# Patient Record
Sex: Male | Born: 1945 | Race: White | Hispanic: No | Marital: Married | Smoking: Never smoker
Health system: Southern US, Community
[De-identification: ages and names within clinical notes are randomized; demographics above are authoritative.]

## PROBLEM LIST (undated history)

## (undated) DIAGNOSIS — J189 Pneumonia, unspecified organism: Secondary | ICD-10-CM

## (undated) DIAGNOSIS — C187 Malignant neoplasm of sigmoid colon: Secondary | ICD-10-CM

## (undated) DIAGNOSIS — Z87442 Personal history of urinary calculi: Secondary | ICD-10-CM

## (undated) DIAGNOSIS — D649 Anemia, unspecified: Secondary | ICD-10-CM

## (undated) DIAGNOSIS — Z7189 Other specified counseling: Secondary | ICD-10-CM

## (undated) HISTORY — PX: SHOULDER SURGERY: SHX246

## (undated) HISTORY — DX: Other specified counseling: Z71.89

## (undated) HISTORY — PX: CARDIAC CATHETERIZATION: SHX172

## (undated) HISTORY — PX: OTHER SURGICAL HISTORY: SHX169

## (undated) HISTORY — PX: HERNIA REPAIR: SHX51

---

## 1999-09-22 ENCOUNTER — Ambulatory Visit (HOSPITAL_COMMUNITY): Admission: RE | Admit: 1999-09-22 | Discharge: 1999-09-22 | Payer: Self-pay | Admitting: Gastroenterology

## 1999-09-22 ENCOUNTER — Encounter (INDEPENDENT_AMBULATORY_CARE_PROVIDER_SITE_OTHER): Payer: Self-pay | Admitting: Specialist

## 2000-10-31 ENCOUNTER — Ambulatory Visit (HOSPITAL_COMMUNITY): Admission: RE | Admit: 2000-10-31 | Discharge: 2000-10-31 | Payer: Self-pay | Admitting: Gastroenterology

## 2003-05-27 ENCOUNTER — Ambulatory Visit (HOSPITAL_COMMUNITY): Admission: RE | Admit: 2003-05-27 | Discharge: 2003-05-27 | Payer: Self-pay | Admitting: Orthopedic Surgery

## 2003-05-27 ENCOUNTER — Ambulatory Visit (HOSPITAL_BASED_OUTPATIENT_CLINIC_OR_DEPARTMENT_OTHER): Admission: RE | Admit: 2003-05-27 | Discharge: 2003-05-27 | Payer: Self-pay | Admitting: Orthopedic Surgery

## 2003-12-29 ENCOUNTER — Ambulatory Visit (HOSPITAL_COMMUNITY): Admission: RE | Admit: 2003-12-29 | Discharge: 2003-12-29 | Payer: Self-pay | Admitting: Gastroenterology

## 2004-05-27 ENCOUNTER — Ambulatory Visit (HOSPITAL_COMMUNITY): Admission: RE | Admit: 2004-05-27 | Discharge: 2004-05-27 | Payer: Self-pay | Admitting: Chiropractic Medicine

## 2005-10-07 ENCOUNTER — Encounter: Admission: RE | Admit: 2005-10-07 | Discharge: 2005-10-07 | Payer: Self-pay | Admitting: Orthopedic Surgery

## 2005-10-16 ENCOUNTER — Ambulatory Visit (HOSPITAL_BASED_OUTPATIENT_CLINIC_OR_DEPARTMENT_OTHER): Admission: RE | Admit: 2005-10-16 | Discharge: 2005-10-16 | Payer: Self-pay | Admitting: Orthopedic Surgery

## 2007-05-31 ENCOUNTER — Emergency Department (HOSPITAL_COMMUNITY): Admission: EM | Admit: 2007-05-31 | Discharge: 2007-05-31 | Payer: Self-pay | Admitting: *Deleted

## 2009-10-31 ENCOUNTER — Emergency Department (HOSPITAL_COMMUNITY): Admission: EM | Admit: 2009-10-31 | Discharge: 2009-10-31 | Payer: Self-pay | Admitting: Emergency Medicine

## 2009-11-08 ENCOUNTER — Ambulatory Visit (HOSPITAL_COMMUNITY): Admission: RE | Admit: 2009-11-08 | Discharge: 2009-11-08 | Payer: Self-pay | Admitting: Urology

## 2010-03-15 ENCOUNTER — Ambulatory Visit (HOSPITAL_BASED_OUTPATIENT_CLINIC_OR_DEPARTMENT_OTHER): Admission: RE | Admit: 2010-03-15 | Discharge: 2010-03-15 | Payer: Self-pay | Admitting: Orthopedic Surgery

## 2010-10-31 LAB — URINE CULTURE
Colony Count: NO GROWTH
Culture: NO GROWTH

## 2010-10-31 LAB — DIFFERENTIAL
Basophils Absolute: 0 10*3/uL (ref 0.0–0.1)
Basophils Relative: 1 % (ref 0–1)
Eosinophils Absolute: 0.2 10*3/uL (ref 0.0–0.7)
Eosinophils Relative: 3 % (ref 0–5)
Lymphocytes Relative: 37 % (ref 12–46)
Lymphs Abs: 3.2 10*3/uL (ref 0.7–4.0)
Monocytes Absolute: 0.8 10*3/uL (ref 0.1–1.0)
Monocytes Relative: 9 % (ref 3–12)
Neutro Abs: 4.4 10*3/uL (ref 1.7–7.7)
Neutrophils Relative %: 51 % (ref 43–77)

## 2010-10-31 LAB — URINALYSIS, ROUTINE W REFLEX MICROSCOPIC
Bilirubin Urine: NEGATIVE
Glucose, UA: NEGATIVE mg/dL
Ketones, ur: NEGATIVE mg/dL
Leukocytes, UA: NEGATIVE
Nitrite: NEGATIVE
Protein, ur: NEGATIVE mg/dL
Specific Gravity, Urine: 1.016 (ref 1.005–1.030)
Urobilinogen, UA: 0.2 mg/dL (ref 0.0–1.0)
pH: 5.5 (ref 5.0–8.0)

## 2010-10-31 LAB — URINE MICROSCOPIC-ADD ON

## 2010-10-31 LAB — POCT I-STAT, CHEM 8
BUN: 13 mg/dL (ref 6–23)
Calcium, Ion: 1.21 mmol/L (ref 1.12–1.32)
Chloride: 106 mEq/L (ref 96–112)
Creatinine, Ser: 1.2 mg/dL (ref 0.4–1.5)
Glucose, Bld: 114 mg/dL — ABNORMAL HIGH (ref 70–99)
HCT: 42 % (ref 39.0–52.0)
Hemoglobin: 14.3 g/dL (ref 13.0–17.0)
Potassium: 3.6 mEq/L (ref 3.5–5.1)
Sodium: 142 mEq/L (ref 135–145)
TCO2: 28 mmol/L (ref 0–100)

## 2010-10-31 LAB — CBC
HCT: 41.2 % (ref 39.0–52.0)
Hemoglobin: 14.4 g/dL (ref 13.0–17.0)
MCHC: 34.9 g/dL (ref 30.0–36.0)
MCV: 91 fL (ref 78.0–100.0)
Platelets: 182 10*3/uL (ref 150–400)
RBC: 4.52 MIL/uL (ref 4.22–5.81)
RDW: 12.1 % (ref 11.5–15.5)
WBC: 8.6 10*3/uL (ref 4.0–10.5)

## 2010-12-23 NOTE — Op Note (Signed)
NAME:  Jack Gray, Jack Gray                            ACCOUNT NO.:  [id number]   MEDICAL RECORD NO.:  [id number]                   PATIENT TYPE:  AMB   LOCATION:  DSC                                  FACILITY:  [facility]   PHYSICIAN:  Feliberto Gottron. Turner Daniels, M.D.                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   PROCEDURE:  Right shoulder arthroscopic anterior inferior acromioplasty,  distal clavicular co-planing, followed by mini-open rotator cuff repair  using two of the 4 mm Revo screws.   SURGEON:  Feliberto Gottron. Turner Daniels, M.D.   FIRST ASSISTANT:  Erskine Squibb B. Jannet Mantis.   ANESTHESIA:  Interscalene block with general endotracheal.   ESTIMATED BLOOD LOSS:  Minimal.   FLUIDS REPLACED:  1 L crystalloid.   DRAINS PLACED:  None.   TOURNIQUET TIME:  None.   INDICATION FOR PROCEDURE:  A 65 year old man with a one-year history of  right shoulder pain and impingement syndrome, a 1 cm type 2-3 subacromial  spur.  MRI scan showed a 5-8 mm full-thickness supraspinatus rotator cuff  tear.  Because of failure to get better with conservative treatment,  temporary response to cortisone injection, he was taken for arthroscopic  decompression of the right shoulder and evaluation and treatment of the  rotator cuff as dictated by the arthroscopy.   DESCRIPTION OF PROCEDURE:  The patient identified by arm band and taken to  the operating room at [facility] day surgery center, appropriate anesthetic  monitors were attached.  Interscalene block anesthesia induced into the  right upper extremity, followed by general endotracheal anesthesia.  He was  placed in a beach chair position an the right upper extremity prepped and  draped in the usual sterile fashion from the wrist to the hemithorax.  Using  a #11 blade, standard portals were made 1.5 cm anterior to the [facility] joint,  lateral to the junction of the middle and posterior thirds of the acromion,  and posterior to the posterolateral corner of the acromion process.  The  arthroscope was introduced laterally, the inflow anteriorly, and a 4.2 Great  White sucker shaver posteriorly, allowing subacromial bursectomy and partial  debridement of a supraspinatus rotator cuff tear at the insertion.  We also  outlined the subacromial and subclavicular spurs, which were then removed  with a 4.5 hooded Vortex bur.  An underwater electrocautery device was used  to control bleeding.  The rest of the rotator cuff was intact externally.  The arthroscope was then repositioned into the glenohumeral joint using the  posterior portal, where we documented some minor fraying of the labrum.  The  articular cartilage looked  to be in good shape.  It was a full-thickness  tear as documented by an internal evaluation of the rotator cuff as well.  The biceps anchor was intact, and the rest of the rotator cuff was intact.  At this point the arthroscopic instruments were removed and a standard  anterolateral deltoid-splitting approach was made to the shoulder, starting  onto the tip of the acromion and going distally for 3-4 cm through the skin  and subcutaneous tissue.  We then split the fibers of the deltoid muscle and  exposed the rotator cuff tear, which was minimally retracted only about 3-4  mm.  Two of the standard 4 mm Revo screw anchors were then placed into the  bone after tapping first, and then the supraspinatus was repaired back to  its original insertion using the 2-0 Ethibond suture simple suture.  Satisfied with the repair, the wound was washed out with normal saline  solution, the subcutaneous tissue closed with 3-0 Vicryl suture, and the  skin with running interlocking 4-0  nylon suture.  A dressing of Xeroform, 4  x 4 dressing sponges, paper tape, and a shoulder immobilizer applied.  The  patient was then awakened and taken to the recovery room without difficulty.                                                Feliberto Gottron. Turner Daniels, M.D.    Ovid Curd  D:  05/27/2003  T:  05/27/2003  Job:  [id number]

## 2010-12-23 NOTE — Op Note (Signed)
NAME:  Jack Gray, Jack Gray                  ACCOUNT NO.:  [id number]   MEDICAL RECORD NO.:  [id number]          PATIENT TYPE:  AMB   LOCATION:  DSC                          FACILITY:  [facility]   PHYSICIAN:  Feliberto Gottron. Turner Daniels, M.D.   DATE OF BIRTH:  03/23/1946   DATE OF PROCEDURE:  10/16/2005  DATE OF DISCHARGE:                                 OPERATIVE REPORT   PREOPERATIVE DIAGNOSIS:  Left shoulder impingement syndrome, partial rotator  cuff tear, possible labral tear.   POSTOPERATIVE DIAGNOSIS:  Left shoulder impingement syndrome with the  anterior inferior subacromial spur, full-thickness rotator cuff tear and  superior labral tear, degenerative in nature.   PROCEDURE:  Left shoulder arthroscopic anterior inferior acromioplasty,  rotator cuff repair using a single parachute anchor from [employer] and  debridement of labral tear.   SURGEON:  Feliberto Gottron. Turner Daniels, MD   ASSISTANT:  Skip Mayer, PA-C.   ANESTHETIC:  Interscalene block plus general endotracheal.   ESTIMATED BLOOD LOSS:  Minimal.   FLUID REPLACEMENT:  600 mL crystalloid.   DRAINS PLACED:  None.   TOURNIQUET TIME:  None.   INDICATIONS FOR PROCEDURE:  65 year old man has previously had a rotator  cuff repair using a mini open technique on the contralateral right side some  years ago.  Had similar symptoms on the left.  MRI scan shows a partial  rotator cuff tear but not full-thickness. Increasing pain, catching and  popping, desires elective arthroscopic evaluation and treatment of the  shoulder up to and including a rotator cuff repair if needed.  Not too much  in way of [facility] joint pain and he has failed conservative treatment. Risks,  benefits of surgery are well on the patient. Questions answered. He is  prepared for surgical intervention.   DESCRIPTION OF PROCEDURE:  The patient identified by armband and taken to  the operating room at [facility] day surgery center after the successful induction  of left shoulder interscalene block.  General endotracheal anesthesia was  administered with the patient in supine position after the appropriate  anesthetic monitors were attached and he was then placed in the beach chair  position, left upper extremity prepped and draped in usual sterile fashion  from the wrist to the hemithorax. We began the procedure by making standard  portals 1.5 cm anterior to the [facility] joint lateral to the junction of middle  and posterior thirds acromion, posterior the posterolateral corner acromion  process. The inflow was placed anteriorly. The arthroscope laterally and a  4.2 great white sucker shaver posteriorly allowing subacromial bursectomy.  We outlined the subacromial spur which was about a centimeter in size and  then removed it with a 4.5 hooded vortex bur. We then carefully evaluated  the external surface the rotator cuff and did find a small tear of the  rotator cuff insertion supraspinatus that was not retracted and was quite  amendable to repair with a single parachute anchor.  Using a supplemental  anterolateral portal, traction was applied to the cuff to make sure it was  fully reduced and then a second anterolateral portal  was placed about a  centimeter in length, allowing introduction of a large 8-mm cannula.  Through this cannula a parachute anchor was then hammered into the rotator  cuff and into the greater tuberosity and screwed down obtaining good firm  fixation and photographic documentation was made of the anchor in place as  well as the reduced rotator cuff.  The arthroscope was then reinserted into  the glenohumeral joint using the posterior portal where we documented good  fixation of reduction of the rotator cuff and also documented a tear of the  superior labrum degenerative in nature which was then debrided with a 4.2  great white sucker shaver. The glenohumeral joint itself was in relatively  good condition as was the biceps anchor.  The shoulder was then irrigated  out  normal saline solution.  Arthroscopic instruments removed and a dressing  of Xeroform, 4x4 dressing sponges, paper tape and a sling immobilizer  applied. The patient was laid supine, awakened and taken to the recovery  room without difficulty.      Feliberto Gottron. Turner Daniels, M.D.  Electronically Signed     FJR/MEDQ  D:  10/16/2005  T:  10/17/2005  Job:  [id number]

## 2010-12-23 NOTE — Op Note (Signed)
NAME:  Jack Gray, Jack Gray                              ACCOUNT NO.:  [id number]   MEDICAL RECORD NO.:  [id number]                   PATIENT TYPE:  AMB   LOCATION:  ENDO                                 FACILITY:  [facility]   PHYSICIAN:  James L. Malon Kindle., M.D.          DATE OF BIRTH:  [birth year]   DATE OF PROCEDURE:  12/29/2003  DATE OF DISCHARGE:                                 OPERATIVE REPORT   PROCEDURE PERFORMED:  Colonoscopy.   ENDOSCOPIST:  Llana Aliment. Edwards, M.D.   MEDICATIONS:  Fentanyl 75 mcg, Versed 7.5 mg IV.   INSTRUMENT USED:  Pediatric Olympus video colonoscope.   INDICATIONS FOR PROCEDURE:  Previous history of colon polyp.  Done as a  three-year follow-up.   DESCRIPTION OF PROCEDURE:  The procedure had been explained to the patient  and consent obtained.  With the patient in the left lateral decubitus  position, the Olympus colonoscope was inserted and advanced.  The prep was  excellent.  The patient had extensive diverticular disease but once we were  able to pass the sigmoid colon, the prep was quite good and we were able to  advance easily to the cecum.  The ileocecal valve and appendiceal orifice  were seen.  The scope was withdrawn and the colon carefully examined upon  withdrawal.  No polyps or other lesions were seen.  The ascending colon,  transverse colon, descending and sigmoid colon were seen with marked  diverticular disease with multiple small and large-mouthed diverticula in  the sigmoid but no polyps.  The rectum was free of polyps as well.  The  scope was retroflexed.  The scope was withdrawn.  The patient tolerated the  procedure well.   ASSESSMENT:  1. No evidence of further colon polyps, V12.72.  2. Extensive diverticulosis.   PLAN:  Will go ahead and check Hemoccults yearly and recommend repeating  colonoscopy in five years.  Will give a diverticulosis information sheet.                                               James L. Malon Kindle., M.D.    Waldron Session  D:  12/29/2003  T:  12/30/2003  Job:  [id number]   cc:   Caryn Bee L. Little, M.D.  [address]  [place]  [place] [address]  Fax: [phone]

## 2010-12-23 NOTE — Procedures (Signed)
[facility]  Patient:    Jack Gray, Jack Gray                         MRN: [id number] Proc. Date: 10/31/00 Adm. Date:  [id number] Attending:  Orland Mustard CC:         Anna Genre. Little, M.D.   Procedure Report  PROCEDURE PERFORMED:  Colonoscopy.  ENDOSCOPIST:  Llana Aliment. Randa Evens, M.D.  MEDICATIONS USED:  Fentanyl 90 mcg, Versed 9 mg IV.  INSTRUMENT:  Olympus video colonoscope.  INDICATIONS:  The patient had heme positive stool, had multiple polyps removed a year ago.  This procedure was done to evaluate for additional polyps.  DESCRIPTION OF PROCEDURE:  The procedure had been explained to the patient and consent obtained.  With the patient in the left lateral decubitus position, the Olympus adult video colonoscope was inserted and advanced under direct visualization.  The prep was excellent and we were able to advance to the cecum without difficulty.  The ileocecal valve was seen.  The scope was withdrawn.  The cecum, ascending colon, hepatic flexure, transverse colon, splenic flexure, descending and sigmoid colon were seen well.  Moderate diverticulosis in the sigmoid colon, no polyps seen throughout the entire colon.  Rectum free of polyps.  Scope withdrawn and patient tolerated the procedure well.  ASSESSMENT: 1. No evidence of any further polyps. 2. Moderate diverticulosis.  PLAN:  Will recommend repeating in three years. DD:  10/31/00 TD:  10/31/00 Job: [id number]

## 2011-05-17 LAB — CBC
HCT: 39.7
Hemoglobin: 13.9
MCHC: 35
MCV: 88.2
Platelets: 228
RBC: 4.5
RDW: 12.5
WBC: 7.1

## 2011-05-17 LAB — DIFFERENTIAL
Basophils Absolute: 0
Basophils Relative: 0
Eosinophils Absolute: 0.1
Eosinophils Relative: 2
Lymphocytes Relative: 19
Lymphs Abs: 1.3
Monocytes Absolute: 0.6
Monocytes Relative: 9
Neutro Abs: 5
Neutrophils Relative %: 70

## 2011-05-17 LAB — POCT I-STAT CREATININE
Creatinine, Ser: 1.3
Operator id: 288331

## 2011-05-17 LAB — I-STAT 8, (EC8 V) (CONVERTED LAB)
Acid-Base Excess: 1
BUN: 18
Bicarbonate: 26.5 — ABNORMAL HIGH
Chloride: 107
Glucose, Bld: 89
HCT: 41
Hemoglobin: 13.9
Operator id: 288331
Potassium: 4.4
Sodium: 140
TCO2: 28
pCO2, Ven: 44.9 — ABNORMAL LOW
pH, Ven: 7.379 — ABNORMAL HIGH

## 2011-05-17 LAB — POCT CARDIAC MARKERS
CKMB, poc: 1.1
Myoglobin, poc: 118
Operator id: 288331
Troponin i, poc: <0.05

## 2011-09-11 DIAGNOSIS — H6120 Impacted cerumen, unspecified ear: Secondary | ICD-10-CM | POA: Diagnosis not present

## 2011-09-11 DIAGNOSIS — Z23 Encounter for immunization: Secondary | ICD-10-CM | POA: Diagnosis not present

## 2011-09-11 DIAGNOSIS — Z0000 Encounter for general adult medical examination without abnormal findings: Secondary | ICD-10-CM | POA: Diagnosis not present

## 2011-09-11 DIAGNOSIS — R7301 Impaired fasting glucose: Secondary | ICD-10-CM | POA: Diagnosis not present

## 2011-09-11 DIAGNOSIS — Z79899 Other long term (current) drug therapy: Secondary | ICD-10-CM | POA: Diagnosis not present

## 2011-09-11 DIAGNOSIS — E785 Hyperlipidemia, unspecified: Secondary | ICD-10-CM | POA: Diagnosis not present

## 2011-09-11 DIAGNOSIS — Z125 Encounter for screening for malignant neoplasm of prostate: Secondary | ICD-10-CM | POA: Diagnosis not present

## 2011-10-20 DIAGNOSIS — L821 Other seborrheic keratosis: Secondary | ICD-10-CM | POA: Diagnosis not present

## 2011-10-20 DIAGNOSIS — L570 Actinic keratosis: Secondary | ICD-10-CM | POA: Diagnosis not present

## 2012-01-04 DIAGNOSIS — H2510 Age-related nuclear cataract, unspecified eye: Secondary | ICD-10-CM | POA: Diagnosis not present

## 2012-03-21 DIAGNOSIS — R7301 Impaired fasting glucose: Secondary | ICD-10-CM | POA: Diagnosis not present

## 2012-03-21 DIAGNOSIS — H6120 Impacted cerumen, unspecified ear: Secondary | ICD-10-CM | POA: Diagnosis not present

## 2012-03-21 DIAGNOSIS — E785 Hyperlipidemia, unspecified: Secondary | ICD-10-CM | POA: Diagnosis not present

## 2012-03-21 DIAGNOSIS — Z79899 Other long term (current) drug therapy: Secondary | ICD-10-CM | POA: Diagnosis not present

## 2012-03-21 DIAGNOSIS — C4491 Basal cell carcinoma of skin, unspecified: Secondary | ICD-10-CM | POA: Diagnosis not present

## 2012-03-21 DIAGNOSIS — Z0000 Encounter for general adult medical examination without abnormal findings: Secondary | ICD-10-CM | POA: Diagnosis not present

## 2012-03-21 DIAGNOSIS — Z86010 Personal history of colonic polyps: Secondary | ICD-10-CM | POA: Diagnosis not present

## 2012-06-10 DIAGNOSIS — Z23 Encounter for immunization: Secondary | ICD-10-CM | POA: Diagnosis not present

## 2012-08-23 DIAGNOSIS — L821 Other seborrheic keratosis: Secondary | ICD-10-CM | POA: Diagnosis not present

## 2012-08-23 DIAGNOSIS — D485 Neoplasm of uncertain behavior of skin: Secondary | ICD-10-CM | POA: Diagnosis not present

## 2012-08-23 DIAGNOSIS — L570 Actinic keratosis: Secondary | ICD-10-CM | POA: Diagnosis not present

## 2012-11-08 DIAGNOSIS — Z0000 Encounter for general adult medical examination without abnormal findings: Secondary | ICD-10-CM | POA: Diagnosis not present

## 2012-11-08 DIAGNOSIS — N200 Calculus of kidney: Secondary | ICD-10-CM | POA: Diagnosis not present

## 2012-11-08 DIAGNOSIS — Z1331 Encounter for screening for depression: Secondary | ICD-10-CM | POA: Diagnosis not present

## 2012-11-08 DIAGNOSIS — Z125 Encounter for screening for malignant neoplasm of prostate: Secondary | ICD-10-CM | POA: Diagnosis not present

## 2012-11-08 DIAGNOSIS — Z79899 Other long term (current) drug therapy: Secondary | ICD-10-CM | POA: Diagnosis not present

## 2012-11-08 DIAGNOSIS — Z86010 Personal history of colonic polyps: Secondary | ICD-10-CM | POA: Diagnosis not present

## 2012-11-08 DIAGNOSIS — R7301 Impaired fasting glucose: Secondary | ICD-10-CM | POA: Diagnosis not present

## 2012-11-08 DIAGNOSIS — E785 Hyperlipidemia, unspecified: Secondary | ICD-10-CM | POA: Diagnosis not present

## 2013-04-28 DIAGNOSIS — J329 Chronic sinusitis, unspecified: Secondary | ICD-10-CM | POA: Diagnosis not present

## 2013-04-28 DIAGNOSIS — R0982 Postnasal drip: Secondary | ICD-10-CM | POA: Diagnosis not present

## 2013-06-04 DIAGNOSIS — Z23 Encounter for immunization: Secondary | ICD-10-CM | POA: Diagnosis not present

## 2013-07-23 DIAGNOSIS — H18219 Corneal edema secondary to contact lens, unspecified eye: Secondary | ICD-10-CM | POA: Diagnosis not present

## 2013-08-21 DIAGNOSIS — H18219 Corneal edema secondary to contact lens, unspecified eye: Secondary | ICD-10-CM | POA: Diagnosis not present

## 2013-08-28 DIAGNOSIS — L821 Other seborrheic keratosis: Secondary | ICD-10-CM | POA: Diagnosis not present

## 2013-08-28 DIAGNOSIS — L819 Disorder of pigmentation, unspecified: Secondary | ICD-10-CM | POA: Diagnosis not present

## 2013-08-28 DIAGNOSIS — D239 Other benign neoplasm of skin, unspecified: Secondary | ICD-10-CM | POA: Diagnosis not present

## 2013-08-28 DIAGNOSIS — L570 Actinic keratosis: Secondary | ICD-10-CM | POA: Diagnosis not present

## 2014-01-08 DIAGNOSIS — Z0000 Encounter for general adult medical examination without abnormal findings: Secondary | ICD-10-CM | POA: Diagnosis not present

## 2014-01-08 DIAGNOSIS — N200 Calculus of kidney: Secondary | ICD-10-CM | POA: Diagnosis not present

## 2014-01-08 DIAGNOSIS — E785 Hyperlipidemia, unspecified: Secondary | ICD-10-CM | POA: Diagnosis not present

## 2014-01-08 DIAGNOSIS — Z86010 Personal history of colonic polyps: Secondary | ICD-10-CM | POA: Diagnosis not present

## 2014-01-08 DIAGNOSIS — Z79899 Other long term (current) drug therapy: Secondary | ICD-10-CM | POA: Diagnosis not present

## 2014-01-08 DIAGNOSIS — Z125 Encounter for screening for malignant neoplasm of prostate: Secondary | ICD-10-CM | POA: Diagnosis not present

## 2014-01-22 DIAGNOSIS — Z86010 Personal history of colonic polyps: Secondary | ICD-10-CM | POA: Diagnosis not present

## 2014-01-22 DIAGNOSIS — Z09 Encounter for follow-up examination after completed treatment for conditions other than malignant neoplasm: Secondary | ICD-10-CM | POA: Diagnosis not present

## 2014-01-22 DIAGNOSIS — D128 Benign neoplasm of rectum: Secondary | ICD-10-CM | POA: Diagnosis not present

## 2014-01-22 DIAGNOSIS — D126 Benign neoplasm of colon, unspecified: Secondary | ICD-10-CM | POA: Diagnosis not present

## 2014-01-22 DIAGNOSIS — K5730 Diverticulosis of large intestine without perforation or abscess without bleeding: Secondary | ICD-10-CM | POA: Diagnosis not present

## 2014-04-06 DIAGNOSIS — D485 Neoplasm of uncertain behavior of skin: Secondary | ICD-10-CM | POA: Diagnosis not present

## 2014-04-06 DIAGNOSIS — D0430 Carcinoma in situ of skin of unspecified part of face: Secondary | ICD-10-CM | POA: Diagnosis not present

## 2014-04-06 DIAGNOSIS — L820 Inflamed seborrheic keratosis: Secondary | ICD-10-CM | POA: Diagnosis not present

## 2014-04-06 DIAGNOSIS — D0439 Carcinoma in situ of skin of other parts of face: Secondary | ICD-10-CM | POA: Diagnosis not present

## 2014-05-05 DIAGNOSIS — Z23 Encounter for immunization: Secondary | ICD-10-CM | POA: Diagnosis not present

## 2014-05-18 DIAGNOSIS — E785 Hyperlipidemia, unspecified: Secondary | ICD-10-CM | POA: Diagnosis not present

## 2014-08-20 DIAGNOSIS — L812 Freckles: Secondary | ICD-10-CM | POA: Diagnosis not present

## 2014-08-20 DIAGNOSIS — L820 Inflamed seborrheic keratosis: Secondary | ICD-10-CM | POA: Diagnosis not present

## 2014-08-20 DIAGNOSIS — L570 Actinic keratosis: Secondary | ICD-10-CM | POA: Diagnosis not present

## 2014-08-20 DIAGNOSIS — D2271 Melanocytic nevi of right lower limb, including hip: Secondary | ICD-10-CM | POA: Diagnosis not present

## 2014-08-20 DIAGNOSIS — L821 Other seborrheic keratosis: Secondary | ICD-10-CM | POA: Diagnosis not present

## 2014-08-20 DIAGNOSIS — D2272 Melanocytic nevi of left lower limb, including hip: Secondary | ICD-10-CM | POA: Diagnosis not present

## 2014-09-24 DIAGNOSIS — H2513 Age-related nuclear cataract, bilateral: Secondary | ICD-10-CM | POA: Diagnosis not present

## 2014-11-17 DIAGNOSIS — H35369 Drusen (degenerative) of macula, unspecified eye: Secondary | ICD-10-CM | POA: Diagnosis not present

## 2014-11-17 DIAGNOSIS — E785 Hyperlipidemia, unspecified: Secondary | ICD-10-CM | POA: Diagnosis not present

## 2015-01-11 DIAGNOSIS — Z0000 Encounter for general adult medical examination without abnormal findings: Secondary | ICD-10-CM | POA: Diagnosis not present

## 2015-01-11 DIAGNOSIS — Z23 Encounter for immunization: Secondary | ICD-10-CM | POA: Diagnosis not present

## 2015-01-11 DIAGNOSIS — Z125 Encounter for screening for malignant neoplasm of prostate: Secondary | ICD-10-CM | POA: Diagnosis not present

## 2015-06-03 DIAGNOSIS — Z23 Encounter for immunization: Secondary | ICD-10-CM | POA: Diagnosis not present

## 2015-07-14 DIAGNOSIS — E785 Hyperlipidemia, unspecified: Secondary | ICD-10-CM | POA: Diagnosis not present

## 2015-07-14 DIAGNOSIS — H35369 Drusen (degenerative) of macula, unspecified eye: Secondary | ICD-10-CM | POA: Diagnosis not present

## 2015-08-23 DIAGNOSIS — L570 Actinic keratosis: Secondary | ICD-10-CM | POA: Diagnosis not present

## 2015-08-23 DIAGNOSIS — L853 Xerosis cutis: Secondary | ICD-10-CM | POA: Diagnosis not present

## 2015-08-23 DIAGNOSIS — L821 Other seborrheic keratosis: Secondary | ICD-10-CM | POA: Diagnosis not present

## 2015-08-23 DIAGNOSIS — L812 Freckles: Secondary | ICD-10-CM | POA: Diagnosis not present

## 2015-09-30 DIAGNOSIS — H35363 Drusen (degenerative) of macula, bilateral: Secondary | ICD-10-CM | POA: Diagnosis not present

## 2016-01-18 DIAGNOSIS — R7309 Other abnormal glucose: Secondary | ICD-10-CM | POA: Diagnosis not present

## 2016-01-18 DIAGNOSIS — Z0000 Encounter for general adult medical examination without abnormal findings: Secondary | ICD-10-CM | POA: Diagnosis not present

## 2016-01-18 DIAGNOSIS — C4481 Basal cell carcinoma of overlapping sites of skin: Secondary | ICD-10-CM | POA: Diagnosis not present

## 2016-01-18 DIAGNOSIS — E785 Hyperlipidemia, unspecified: Secondary | ICD-10-CM | POA: Diagnosis not present

## 2016-01-18 DIAGNOSIS — Z125 Encounter for screening for malignant neoplasm of prostate: Secondary | ICD-10-CM | POA: Diagnosis not present

## 2016-01-18 DIAGNOSIS — H35369 Drusen (degenerative) of macula, unspecified eye: Secondary | ICD-10-CM | POA: Diagnosis not present

## 2016-01-18 DIAGNOSIS — Z86010 Personal history of colonic polyps: Secondary | ICD-10-CM | POA: Diagnosis not present

## 2016-04-25 DIAGNOSIS — Z23 Encounter for immunization: Secondary | ICD-10-CM | POA: Diagnosis not present

## 2016-07-24 DIAGNOSIS — R7309 Other abnormal glucose: Secondary | ICD-10-CM | POA: Diagnosis not present

## 2016-07-24 DIAGNOSIS — E785 Hyperlipidemia, unspecified: Secondary | ICD-10-CM | POA: Diagnosis not present

## 2016-09-18 DIAGNOSIS — D2272 Melanocytic nevi of left lower limb, including hip: Secondary | ICD-10-CM | POA: Diagnosis not present

## 2016-09-18 DIAGNOSIS — D2271 Melanocytic nevi of right lower limb, including hip: Secondary | ICD-10-CM | POA: Diagnosis not present

## 2016-09-18 DIAGNOSIS — L821 Other seborrheic keratosis: Secondary | ICD-10-CM | POA: Diagnosis not present

## 2016-09-18 DIAGNOSIS — L820 Inflamed seborrheic keratosis: Secondary | ICD-10-CM | POA: Diagnosis not present

## 2016-09-18 DIAGNOSIS — D2262 Melanocytic nevi of left upper limb, including shoulder: Secondary | ICD-10-CM | POA: Diagnosis not present

## 2016-09-18 DIAGNOSIS — L603 Nail dystrophy: Secondary | ICD-10-CM | POA: Diagnosis not present

## 2016-10-24 DIAGNOSIS — H353131 Nonexudative age-related macular degeneration, bilateral, early dry stage: Secondary | ICD-10-CM | POA: Diagnosis not present

## 2017-01-23 DIAGNOSIS — R7309 Other abnormal glucose: Secondary | ICD-10-CM | POA: Diagnosis not present

## 2017-01-23 DIAGNOSIS — Z0000 Encounter for general adult medical examination without abnormal findings: Secondary | ICD-10-CM | POA: Diagnosis not present

## 2017-01-23 DIAGNOSIS — E785 Hyperlipidemia, unspecified: Secondary | ICD-10-CM | POA: Diagnosis not present

## 2017-01-23 DIAGNOSIS — Z86010 Personal history of colonic polyps: Secondary | ICD-10-CM | POA: Diagnosis not present

## 2017-01-23 DIAGNOSIS — L240 Irritant contact dermatitis due to detergents: Secondary | ICD-10-CM | POA: Diagnosis not present

## 2017-01-23 DIAGNOSIS — C4481 Basal cell carcinoma of overlapping sites of skin: Secondary | ICD-10-CM | POA: Diagnosis not present

## 2017-01-23 DIAGNOSIS — H35369 Drusen (degenerative) of macula, unspecified eye: Secondary | ICD-10-CM | POA: Diagnosis not present

## 2017-01-23 DIAGNOSIS — Z125 Encounter for screening for malignant neoplasm of prostate: Secondary | ICD-10-CM | POA: Diagnosis not present

## 2017-04-24 DIAGNOSIS — C187 Malignant neoplasm of sigmoid colon: Secondary | ICD-10-CM | POA: Diagnosis not present

## 2017-04-25 DIAGNOSIS — K5730 Diverticulosis of large intestine without perforation or abscess without bleeding: Secondary | ICD-10-CM | POA: Diagnosis not present

## 2017-04-25 DIAGNOSIS — C189 Malignant neoplasm of colon, unspecified: Secondary | ICD-10-CM | POA: Diagnosis not present

## 2017-04-25 DIAGNOSIS — Z86010 Personal history of colonic polyps: Secondary | ICD-10-CM | POA: Diagnosis not present

## 2017-04-26 DIAGNOSIS — C187 Malignant neoplasm of sigmoid colon: Secondary | ICD-10-CM | POA: Diagnosis not present

## 2017-04-26 DIAGNOSIS — K639 Disease of intestine, unspecified: Secondary | ICD-10-CM | POA: Diagnosis not present

## 2017-04-27 ENCOUNTER — Other Ambulatory Visit: Payer: Self-pay | Admitting: Gastroenterology

## 2017-04-27 DIAGNOSIS — K6389 Other specified diseases of intestine: Secondary | ICD-10-CM

## 2017-04-30 DIAGNOSIS — C189 Malignant neoplasm of colon, unspecified: Secondary | ICD-10-CM | POA: Diagnosis not present

## 2017-05-01 ENCOUNTER — Ambulatory Visit
Admission: RE | Admit: 2017-05-01 | Discharge: 2017-05-01 | Disposition: A | Discharge status: Home / Self Care | Payer: Medicare Other | Source: Ambulatory Visit | Attending: Gastroenterology | Admitting: Gastroenterology

## 2017-05-01 DIAGNOSIS — K5730 Diverticulosis of large intestine without perforation or abscess without bleeding: Secondary | ICD-10-CM | POA: Diagnosis not present

## 2017-05-01 DIAGNOSIS — K6389 Other specified diseases of intestine: Secondary | ICD-10-CM

## 2017-05-01 MED ORDER — IOPAMIDOL (ISOVUE-300) INJECTION 61%
100.0000 mL | Freq: Once | INTRAVENOUS | Status: AC | PRN
  Administered 2017-05-01: 100 mL via INTRAVENOUS

## 2017-05-09 DIAGNOSIS — B9681 Helicobacter pylori [H. pylori] as the cause of diseases classified elsewhere: Secondary | ICD-10-CM | POA: Diagnosis not present

## 2017-05-09 DIAGNOSIS — K2930 Chronic superficial gastritis without bleeding: Secondary | ICD-10-CM | POA: Diagnosis not present

## 2017-05-09 DIAGNOSIS — K2950 Unspecified chronic gastritis without bleeding: Secondary | ICD-10-CM | POA: Diagnosis not present

## 2017-05-09 DIAGNOSIS — K2970 Gastritis, unspecified, without bleeding: Secondary | ICD-10-CM | POA: Diagnosis not present

## 2017-05-09 DIAGNOSIS — K229 Disease of esophagus, unspecified: Secondary | ICD-10-CM | POA: Diagnosis not present

## 2017-05-09 DIAGNOSIS — K210 Gastro-esophageal reflux disease with esophagitis: Secondary | ICD-10-CM | POA: Diagnosis not present

## 2017-05-09 DIAGNOSIS — R933 Abnormal findings on diagnostic imaging of other parts of digestive tract: Secondary | ICD-10-CM | POA: Diagnosis not present

## 2017-05-09 DIAGNOSIS — K228 Other specified diseases of esophagus: Secondary | ICD-10-CM | POA: Diagnosis not present

## 2017-05-11 ENCOUNTER — Other Ambulatory Visit: Payer: Self-pay | Admitting: General Surgery

## 2017-05-11 DIAGNOSIS — C187 Malignant neoplasm of sigmoid colon: Secondary | ICD-10-CM | POA: Diagnosis not present

## 2017-05-11 DIAGNOSIS — E785 Hyperlipidemia, unspecified: Secondary | ICD-10-CM | POA: Diagnosis not present

## 2017-05-15 DIAGNOSIS — Z23 Encounter for immunization: Secondary | ICD-10-CM | POA: Diagnosis not present

## 2017-05-18 DIAGNOSIS — B9681 Helicobacter pylori [H. pylori] as the cause of diseases classified elsewhere: Secondary | ICD-10-CM | POA: Diagnosis not present

## 2017-05-18 DIAGNOSIS — K210 Gastro-esophageal reflux disease with esophagitis: Secondary | ICD-10-CM | POA: Diagnosis not present

## 2017-05-18 DIAGNOSIS — K2930 Chronic superficial gastritis without bleeding: Secondary | ICD-10-CM | POA: Diagnosis not present

## 2017-05-18 DIAGNOSIS — K2950 Unspecified chronic gastritis without bleeding: Secondary | ICD-10-CM | POA: Diagnosis not present

## 2017-05-18 NOTE — Patient Instructions (Addendum)
Jack Gray  05/18/2017   Your procedure is scheduled on: 05/30/17  Report to [facility] Main  Entrance Take [place]  elevators to 3rd floor to  [place] at    [place] AM.    Call this number if you have problems the morning of surgery [phone]    Remember: ONLY 1 PERSON MAY GO WITH YOU TO SHORT STAY TO GET  READY MORNING OF YOUR SURGERY.             Follow a clear liquid diet the day of your bowel prep. Follow bowel prep per MD instructions  Do not eat food or drink liquids :After Midnight.     Take these medicines the morning of surgery with A SIP OF WATER: protonix                                You may not have any metal on your body including hair pins and              piercings  Do not wear jewelry,  lotions, powders or perfumes, deodorant                        Men may shave face and neck.   Do not bring valuables to the hospital. [name].  Contacts, dentures or bridgework may not be worn into surgery.  Leave suitcase in the car. After surgery it may be brought to your room.               Please read over the following fact sheets you were given: _____________________________________________________________________          [facility] - Preparing for Surgery Before surgery, you can play an important role.  Because skin is not sterile, your skin needs to be as free of germs as possible.  You can reduce the number of germs on your skin by washing with CHG (chlorahexidine gluconate) soap before surgery.  CHG is an antiseptic cleaner which kills germs and bonds with the skin to continue killing germs even after washing. Please DO NOT use if you have an allergy to CHG or antibacterial soaps.  If your skin becomes reddened/irritated stop using the CHG and inform your nurse when you arrive at Short Stay. Do not shave (including legs and underarms) for at least 48 hours prior to the first CHG  shower.  You may shave your face/neck. Please follow these instructions carefully:  1.  Shower with CHG Soap the night before surgery and the  morning of Surgery.  2.  If you choose to wash your hair, wash your hair first as usual with your  normal  shampoo.  3.  After you shampoo, rinse your hair and body thoroughly to remove the  shampoo.                           4.  Use CHG as you would any other liquid soap.  You can apply chg directly  to the skin and wash                       Gently with  a scrungie or clean washcloth.  5.  Apply the CHG Soap to your body ONLY FROM THE NECK DOWN.   Do not use on face/ open                           Wound or open sores. Avoid contact with eyes, ears mouth and genitals (private parts).                       Wash face,  Genitals (private parts) with your normal soap.             6.  Wash thoroughly, paying special attention to the area where your surgery  will be performed.  7.  Thoroughly rinse your body with warm water from the neck down.  8.  DO NOT shower/wash with your normal soap after using and rinsing off  the CHG Soap.                9.  Pat yourself dry with a clean towel.            10.  Wear clean pajamas.            11.  Place clean sheets on your bed the night of your first shower and do not  sleep with pets. Day of Surgery : Do not apply any lotions/deodorants the morning of surgery.  Please wear clean clothes to the hospital/surgery center.  FAILURE TO FOLLOW THESE INSTRUCTIONS MAY RESULT IN THE CANCELLATION OF YOUR SURGERY PATIENT SIGNATURE_________________________________  NURSE SIGNATURE__________________________________  ________________________________________________________________________      Follow a CLEAR LIQUID DIET the day of your bowel prep   Foods Allowed                                                                     Foods Excluded  Coffee and tea, regular and decaf                             liquids that you  cannot  Plain Jell-O in any flavor                                             see through such as: Fruit ices (not with fruit pulp)                                     milk, soups, orange juice  Iced Popsicles                                    All solid food Carbonated beverages, regular and diet                                    Cranberry, grape and apple juices Sports drinks like Gatorade Lightly seasoned clear  broth or consume(fat free) Sugar, honey syrup  Sample Menu Breakfast                                Lunch                                     Supper Cranberry juice                    Beef broth                            Chicken broth Jell-O                                     Grape juice                           Apple juice Coffee or tea                        Jell-O                                      Popsicle                                                Coffee or tea                        Coffee or tea  _____________________________________________________________________   WHAT IS A BLOOD TRANSFUSION? Blood Transfusion Information  A transfusion is the replacement of blood or some of its parts. Blood is made up of multiple cells which provide different functions.  Red blood cells carry oxygen and are used for blood loss replacement.  White blood cells fight against infection.  Platelets control bleeding.  Plasma helps clot blood.  Other blood products are available for specialized needs, such as hemophilia or other clotting disorders. BEFORE THE TRANSFUSION  Who gives blood for transfusions?   Healthy volunteers who are fully evaluated to make sure their blood is safe. This is blood bank blood. Transfusion therapy is the safest it has ever been in the practice of medicine. Before blood is taken from a donor, a complete history is taken to make sure that person has no history of diseases nor engages in risky social behavior (examples are intravenous drug use  or sexual activity with multiple partners). The donor's travel history is screened to minimize risk of transmitting infections, such as malaria. The donated blood is tested for signs of infectious diseases, such as HIV and hepatitis. The blood is then tested to be sure it is compatible with you in order to minimize the chance of a transfusion reaction. If you or a relative donates blood, this is often done in anticipation of surgery and is not appropriate for emergency situations. It takes many days to process the donated blood. RISKS AND COMPLICATIONS Although transfusion therapy is very safe and saves many  lives, the main dangers of transfusion include:   Getting an infectious disease.  Developing a transfusion reaction. This is an allergic reaction to something in the blood you were given. Every precaution is taken to prevent this. The decision to have a blood transfusion has been considered carefully by your caregiver before blood is given. Blood is not given unless the benefits outweigh the risks. AFTER THE TRANSFUSION  Right after receiving a blood transfusion, you will usually feel much better and more energetic. This is especially true if your red blood cells have gotten low (anemic). The transfusion raises the level of the red blood cells which carry oxygen, and this usually causes an energy increase.  The nurse administering the transfusion will monitor you carefully for complications. HOME CARE INSTRUCTIONS  No special instructions are needed after a transfusion. You may find your energy is better. Speak with your caregiver about any limitations on activity for underlying diseases you may have. SEEK MEDICAL CARE IF:   Your condition is not improving after your transfusion.  You develop redness or irritation at the intravenous (IV) site. SEEK IMMEDIATE MEDICAL CARE IF:  Any of the following symptoms occur over the next 12 hours:  Shaking chills.  You have a temperature by mouth  above 102 F (38.9 C), not controlled by medicine.  Chest, back, or muscle pain.  People around you feel you are not acting correctly or are confused.  Shortness of breath or difficulty breathing.  Dizziness and fainting.  You get a rash or develop hives.  You have a decrease in urine output.  Your urine turns a dark color or changes to pink, red, or brown. Any of the following symptoms occur over the next 10 days:  You have a temperature by mouth above 102 F (38.9 C), not controlled by medicine.  Shortness of breath.  Weakness after normal activity.  The white part of the eye turns yellow (jaundice).  You have a decrease in the amount of urine or are urinating less often.  Your urine turns a dark color or changes to pink, red, or brown. Document Released: 07/21/2000 Document Revised: 10/16/2011 Document Reviewed: 03/09/2008 ExitCare Patient Information 2014 [place].  _______________________________________________________________________  Incentive Spirometer  An incentive spirometer is a tool that can help keep your lungs clear and active. This tool measures how well you are filling your lungs with each breath. Taking long deep breaths may help reverse or decrease the chance of developing breathing (pulmonary) problems (especially infection) following:  A long period of time when you are unable to move or be active. BEFORE THE PROCEDURE   If the spirometer includes an indicator to show your best effort, your nurse or respiratory therapist will set it to a desired goal.  If possible, sit up straight or lean slightly forward. Try not to slouch.  Hold the incentive spirometer in an upright position. INSTRUCTIONS FOR USE  1. Sit on the edge of your bed if possible, or sit up as far as you can in bed or on a chair. 2. Hold the incentive spirometer in an upright position. 3. Breathe out normally. 4. Place the mouthpiece in your mouth and seal your lips tightly  around it. 5. Breathe in slowly and as deeply as possible, raising the piston or the ball toward the top of the column. 6. Hold your breath for 3-5 seconds or for as long as possible. Allow the piston or ball to fall to the bottom of the column. 7. Remove  the mouthpiece from your mouth and breathe out normally. 8. Rest for a few seconds and repeat Steps 1 through 7 at least 10 times every 1-2 hours when you are awake. Take your time and take a few normal breaths between deep breaths. 9. The spirometer may include an indicator to show your best effort. Use the indicator as a goal to work toward during each repetition. 10. After each set of 10 deep breaths, practice coughing to be sure your lungs are clear. If you have an incision (the cut made at the time of surgery), support your incision when coughing by placing a pillow or rolled up towels firmly against it. Once you are able to get out of bed, walk around indoors and cough well. You may stop using the incentive spirometer when instructed by your caregiver.  RISKS AND COMPLICATIONS  Take your time so you do not get dizzy or light-headed.  If you are in pain, you may need to take or ask for pain medication before doing incentive spirometry. It is harder to take a deep breath if you are having pain. AFTER USE  Rest and breathe slowly and easily.  It can be helpful to keep track of a log of your progress. Your caregiver can provide you with a simple table to help with this. If you are using the spirometer at home, follow these instructions: [place] IF:   You are having difficultly using the spirometer.  You have trouble using the spirometer as often as instructed.  Your pain medication is not giving enough relief while using the spirometer.  You develop fever of 100.5 F (38.1 C) or higher. SEEK IMMEDIATE MEDICAL CARE IF:   You cough up bloody sputum that had not been present before.  You develop fever of 102 F (38.9 C) or  greater.  You develop worsening pain at or near the incision site. MAKE SURE YOU:   Understand these instructions.  Will watch your condition.  Will get help right away if you are not doing well or get worse. Document Released: 12/04/2006 Document Revised: 10/16/2011 Document Reviewed: 02/04/2007 [facility] Patient Information 2014 [place], [place].   ________________________________________________________________________

## 2017-05-24 ENCOUNTER — Encounter (HOSPITAL_COMMUNITY)
Admission: RE | Admit: 2017-05-24 | Discharge: 2017-05-24 | Disposition: A | Discharge status: Home / Self Care | Payer: Medicare Other | Source: Ambulatory Visit | Attending: General Surgery | Admitting: General Surgery

## 2017-05-24 ENCOUNTER — Encounter (HOSPITAL_COMMUNITY): Payer: Self-pay

## 2017-05-24 DIAGNOSIS — D649 Anemia, unspecified: Secondary | ICD-10-CM | POA: Insufficient documentation

## 2017-05-24 DIAGNOSIS — Z01812 Encounter for preprocedural laboratory examination: Secondary | ICD-10-CM | POA: Insufficient documentation

## 2017-05-24 DIAGNOSIS — C187 Malignant neoplasm of sigmoid colon: Secondary | ICD-10-CM | POA: Insufficient documentation

## 2017-05-24 HISTORY — DX: Pneumonia, unspecified organism: J18.9

## 2017-05-24 HISTORY — DX: Personal history of urinary calculi: Z87.442

## 2017-05-24 HISTORY — DX: Anemia, unspecified: D64.9

## 2017-05-24 LAB — COMPREHENSIVE METABOLIC PANEL
ALT: 28 U/L (ref 17–63)
ANION GAP: 7 (ref 5–15)
AST: 35 U/L (ref 15–41)
Albumin: 4.2 g/dL (ref 3.5–5.0)
Alkaline Phosphatase: 78 U/L (ref 38–126)
BUN: 17 mg/dL (ref 6–20)
CHLORIDE: 104 mmol/L (ref 101–111)
CO2: 28 mmol/L (ref 22–32)
Calcium: 9.4 mg/dL (ref 8.9–10.3)
Creatinine, Ser: 1.11 mg/dL (ref 0.61–1.24)
Glucose, Bld: 99 mg/dL (ref 65–99)
POTASSIUM: 4.4 mmol/L (ref 3.5–5.1)
Sodium: 139 mmol/L (ref 135–145)
Total Bilirubin: 0.9 mg/dL (ref 0.3–1.2)
Total Protein: 7.1 g/dL (ref 6.5–8.1)

## 2017-05-24 LAB — ABO/RH: ABO/RH(D): O POS

## 2017-05-24 LAB — CBC WITH DIFFERENTIAL/PLATELET
Basophils Absolute: 0 10*3/uL (ref 0.0–0.1)
Basophils Relative: 1 %
EOS PCT: 3 %
Eosinophils Absolute: 0.2 10*3/uL (ref 0.0–0.7)
HCT: 38 % — ABNORMAL LOW (ref 39.0–52.0)
Hemoglobin: 12.9 g/dL — ABNORMAL LOW (ref 13.0–17.0)
LYMPHS ABS: 1.5 10*3/uL (ref 0.7–4.0)
LYMPHS PCT: 23 %
MCH: 29.9 pg (ref 26.0–34.0)
MCHC: 33.9 g/dL (ref 30.0–36.0)
MCV: 88.2 fL (ref 78.0–100.0)
MONO ABS: 0.5 10*3/uL (ref 0.1–1.0)
Monocytes Relative: 8 %
Neutro Abs: 4.1 10*3/uL (ref 1.7–7.7)
Neutrophils Relative %: 65 %
PLATELETS: 176 10*3/uL (ref 150–400)
RBC: 4.31 MIL/uL (ref 4.22–5.81)
RDW: 12.4 % (ref 11.5–15.5)
WBC: 6.3 10*3/uL (ref 4.0–10.5)

## 2017-05-24 LAB — HEMOGLOBIN A1C
HEMOGLOBIN A1C: 5.5 % (ref 4.8–5.6)
MEAN PLASMA GLUCOSE: 111.15 mg/dL

## 2017-05-26 NOTE — H&P (Addendum)
[name] Location: [facility] Patient #: [id number] DOB: Dec 24, 1945 Unknown / Language: Cleophus Molt / Race: White Male        History of Present Illness       This is a very pleasant and very healthy 71 year old gentleman, referred by Dr. 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 Dr. Jacelyn Grip is his PCP. They request Dr. Burney Gauze as their medical oncologist. His wife and daughter are with him throughout the encounter today.      Patient states he's had some colon polyps in the past. Recent colonoscopy 3 years after his last was done electively. No symptoms. It shows a non-circumferential ulcerated mass at 40 cm mid to proximal sigmoid. 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 MSI stable. We have syndrome felt to be unlikely. CEA 2.3. Creatinine 1.41. LFTs normal. CT scan shows a 3 cm mass in the proximal sigmoid. There are some borderline mesenteric nodes. The CT said there was a 2 cm rounded mass in the second portion of the duodenum Upper endoscopy was done earlier this week and shows a little bit of gastritis but there was no duodenal mass whatsoever.      Past history is significant only for hyperlipidemia and colon polyps. He is healthy. Family history is negative for colon cancer or breast cancer. One sister died of melanoma possibly metastatic to the lungs. One sister living several years after head and neck cancer stage I [place]  history reveals that he lives in [place]. He is married. There is one daughter here with him. One child died at age 66 in the sleep of unknown cause. Denies tobacco. Alcohol occasionally The daughter is with him today lost her husband to metastatic colon cancer which is very stressful for her admittedly.     I discussed his diagnosis, its anatomy, and the standards of care for surgical management. I reviewed all of his workup to date. He will be scheduled for  laparoscopic-assisted left colectomy, possible splenic flexure takedown. I discussed the indications, details, techniques, and numerous risk of the surgery with him and his family. He is aware the risk of bleeding, infection, wound healing problems such as skin infection or hernia, deep space infection such as abscess or anastomotic leak requiring reoperation and colostomy. I quoted a 5% right on that. Cardiac pulmonary and thromboembolic problems are also mentioned. He knows he will need a bowel prep. He seems to understand all these issues very well. All of his questions were answered. He agrees with this plan.  We gave him a mechanical and antibiotic bowel prep He requests [place] long if possible and we will see if we can do that Orders entered Request urgent scheduling   Past Surgical History  Colon Polyp Removal - Colonoscopy  Open Inguinal Hernia Surgery  Bilateral.  Diagnostic Studies History  Colonoscopy  within last year  Allergies No Known Drug Allergies  Demerol *ANALGESICS - OPIOID*   Medication History  PreserVision AREDS 2 (Oral) Active. Multi Vitamin Daily (Oral) Active. Vitamin D (Cholecalciferol) (1000UNIT Capsule, Oral) Active. Glucosamine (500MG Capsule, Oral) Active. Medications Reconciled  Social History  Alcohol use  Occasional alcohol use. Caffeine use  Tea. No drug use  Tobacco use  Never smoker.  Family History  Alcohol Abuse  Father. Arthritis  Sister. Melanoma  Sister. Respiratory Condition  Mother.  Other Problems  Colon Cancer  Hemorrhoids  Hypercholesterolemia  Inguinal Hernia  Kidney Stone  Umbilical Hernia Repair  Review of Systems  General Not Present- Appetite Loss, Chills, Fatigue, Fever, Night Sweats, Weight Gain and Weight Loss. Skin Not Present- Change in Wart/Mole, Dryness, Hives, Jaundice, New Lesions, Non-Healing Wounds, Rash and Ulcer. HEENT Not Present- Earache, Hearing Loss, Hoarseness,  Nose Bleed, Oral Ulcers, Ringing in the Ears, Seasonal Allergies, Sinus Pain, Sore Throat, Visual Disturbances, Wears glasses/contact lenses and Yellow Eyes. Respiratory Present- Snoring. Not Present- Bloody sputum, Chronic Cough, Difficulty Breathing and Wheezing. Breast Not Present- Breast Mass, Breast Pain, Nipple Discharge and Skin Changes. Cardiovascular Not Present- Chest Pain, Difficulty Breathing Lying Down, Leg Cramps, Palpitations, Rapid Heart Rate, Shortness of Breath and Swelling of Extremities. Gastrointestinal Present- Hemorrhoids. Not Present- Abdominal Pain, Bloating, Bloody Stool, Change in Bowel Habits, Chronic diarrhea, Constipation, Difficulty Swallowing, Excessive gas, Gets full quickly at meals, Indigestion, Nausea, Rectal Pain and Vomiting. Musculoskeletal Not Present- Back Pain, Joint Pain, Joint Stiffness, Muscle Pain, Muscle Weakness and Swelling of Extremities. Neurological Not Present- Decreased Memory, Fainting, Headaches, Numbness, Seizures, Tingling, Tremor, Trouble walking and Weakness. Psychiatric Not Present- Anxiety, Bipolar, Change in Sleep Pattern, Depression, Fearful and Frequent crying. Endocrine Not Present- Cold Intolerance, Excessive Hunger, Hair Changes, Heat Intolerance, Hot flashes and New Diabetes. Hematology Not Present- Blood Thinners, Easy Bruising, Excessive bleeding, Gland problems, HIV and Persistent Infections.  Vitals  Weight: 164.25 lb Height: 68in Body Surface Area: 1.88 m Body Mass Index: 24.97 kg/m  Temp.: 98.87F  Pulse: 89 (Regular)  BP: 122/80 (Sitting, Left Arm, Standard)       Physical Exam  General Mental Status-Alert. General Appearance-Consistent with stated age. Hydration-Well hydrated. Voice-Normal. Note: He is fit and healthy for his age. BMI 25.   Head and Neck Head-normocephalic, atraumatic with no lesions or palpable masses. Trachea-midline. Thyroid Gland Characteristics - normal size  and consistency.  Eye Eyeball - Bilateral-Extraocular movements intact. Sclera/Conjunctiva - Bilateral-No scleral icterus.  Chest and Lung Exam Chest and lung exam reveals -quiet, even and easy respiratory effort with no use of accessory muscles and on auscultation, normal breath sounds, no adventitious sounds and normal vocal resonance. Inspection Chest Wall - Normal. Back - normal.  Cardiovascular Cardiovascular examination reveals -normal heart sounds, regular rate and rhythm with no murmurs and normal pedal pulses bilaterally.  Abdomen Inspection Inspection of the abdomen reveals - No Hernias. Skin - Scar - no surgical scars. Palpation/Percussion Palpation and Percussion of the abdomen reveal - Soft, Non Tender, No Rebound tenderness, No Rigidity (guarding) and No hepatosplenomegaly. Auscultation Auscultation of the abdomen reveals - Bowel sounds normal. Note: Soft. No scars. No mass. No hernia.   Neurologic Neurologic evaluation reveals -alert and oriented x 3 with no impairment of recent or remote memory. Mental Status-Normal.  Musculoskeletal Normal Exam - Left-Upper Extremity Strength Normal and Lower Extremity Strength Normal. Normal Exam - Right-Upper Extremity Strength Normal and Lower Extremity Strength Normal.  Lymphatic Head & Neck  General Head & Neck Lymphatics: Bilateral - Description - Normal. Axillary  General Axillary Region: Bilateral - Description - Normal. Tenderness - Non Tender. Femoral & Inguinal  Generalized Femoral & Inguinal Lymphatics: Bilateral - Description - Normal. Tenderness - Non Tender.    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 Local Order: TAKE TWO TABLETS AT 2 PM, 3 PM, AND 10 PM THE DAY PRIOR TO SURGERY Started Flagyl 500MG, 2 (two) Tablet SEE NOTE, #6, 05/11/2017, No Refill. Local Order: Take at 2pm,  3pm, and 10pm  the day prior to your colon operation    Your recent screening colonoscopy shows a small colon cancer in the mid to proximal sigmoid colon The tumor appears to be about 3 cm in diameter Some of the lymph nodes in the area are borderline enlarged but not necessarily involved by cancer The liver looks normal Your liver function tests are normal Your blood test test for cancer, called CEA, is 2.3 and that is basically normal  Your CT scan shows a 3 cm mass in the proximal sigmoid colon and suggested a mass in the second portion of the duodenum Fortunately, your recent upper endoscopy was negative There is no evidence of distant metastatic disease on your CT scan   you will be scheduled for laparoscopic-assisted sigmoid colectomy, possible open colectomy The intent of this surgery is complete care We have discussed the indications, techniques, and risks of this surgery in detail with you and your family Please read the information booklets that I gave you My nursing staff will go over the mechanical bowel prep and antibiotic bowel prep The antibiotics have been called to your pharmacy We will schedule the surgery as urgently as the operating rooms will allow You have requested [place] long and we will try to accommodate  you will be referred to Dr. Burney Gauze postop  HYPERLIPIDEMIA, ACQUIRED (E78.5)    Edsel Petrin. Dalbert Batman, M.D., [facility] General and Minimally invasive Surgery Breast and Colorectal Surgery Office:   [phone] Pager:   [phone]

## 2017-05-30 ENCOUNTER — Inpatient Hospital Stay (HOSPITAL_COMMUNITY): Payer: Medicare Other | Admitting: Certified Registered Nurse Anesthetist

## 2017-05-30 ENCOUNTER — Encounter (HOSPITAL_COMMUNITY)
Admission: RE | Disposition: A | Discharge status: Home / Self Care | Payer: Self-pay | Source: Ambulatory Visit | Attending: General Surgery

## 2017-05-30 ENCOUNTER — Inpatient Hospital Stay (HOSPITAL_COMMUNITY)
Admission: RE | Admit: 2017-05-30 | Discharge: 2017-06-03 | DRG: 331 | Disposition: A | Discharge status: Home / Self Care | Payer: Medicare Other | Source: Ambulatory Visit | Attending: General Surgery | Admitting: General Surgery

## 2017-05-30 ENCOUNTER — Encounter (HOSPITAL_COMMUNITY): Payer: Self-pay | Admitting: Certified Registered Nurse Anesthetist

## 2017-05-30 DIAGNOSIS — K660 Peritoneal adhesions (postprocedural) (postinfection): Secondary | ICD-10-CM | POA: Diagnosis present

## 2017-05-30 DIAGNOSIS — Z79899 Other long term (current) drug therapy: Secondary | ICD-10-CM

## 2017-05-30 DIAGNOSIS — Z86010 Personal history of colonic polyps: Secondary | ICD-10-CM | POA: Diagnosis not present

## 2017-05-30 DIAGNOSIS — C187 Malignant neoplasm of sigmoid colon: Secondary | ICD-10-CM | POA: Diagnosis present

## 2017-05-30 DIAGNOSIS — K2970 Gastritis, unspecified, without bleeding: Secondary | ICD-10-CM | POA: Diagnosis present

## 2017-05-30 DIAGNOSIS — Z885 Allergy status to narcotic agent status: Secondary | ICD-10-CM | POA: Diagnosis not present

## 2017-05-30 DIAGNOSIS — E785 Hyperlipidemia, unspecified: Secondary | ICD-10-CM | POA: Diagnosis present

## 2017-05-30 DIAGNOSIS — G8918 Other acute postprocedural pain: Secondary | ICD-10-CM | POA: Diagnosis not present

## 2017-05-30 HISTORY — PX: PARTIAL COLECTOMY: SHX5273

## 2017-05-30 HISTORY — DX: Malignant neoplasm of sigmoid colon: C18.7

## 2017-05-30 LAB — TYPE AND SCREEN
ABO/RH(D): O POS
ANTIBODY SCREEN: NEGATIVE

## 2017-05-30 LAB — CBC
HEMATOCRIT: 36 % — AB (ref 39.0–52.0)
HEMOGLOBIN: 12.6 g/dL — AB (ref 13.0–17.0)
MCH: 30.7 pg (ref 26.0–34.0)
MCHC: 35 g/dL (ref 30.0–36.0)
MCV: 87.6 fL (ref 78.0–100.0)
Platelets: 145 10*3/uL — ABNORMAL LOW (ref 150–400)
RBC: 4.11 MIL/uL — ABNORMAL LOW (ref 4.22–5.81)
RDW: 12.5 % (ref 11.5–15.5)
WBC: 11.1 10*3/uL — ABNORMAL HIGH (ref 4.0–10.5)

## 2017-05-30 LAB — CREATININE, SERUM
Creatinine, Ser: 1.28 mg/dL — ABNORMAL HIGH (ref 0.61–1.24)
GFR calc Af Amer: >60 mL/min (ref >60)
GFR, EST NON AFRICAN AMERICAN: 55 mL/min — AB (ref >60)

## 2017-05-30 SURGERY — COLECTOMY, PARTIAL
Anesthesia: General | Site: Abdomen | Laterality: Left

## 2017-05-30 MED ORDER — LIDOCAINE HCL 2 % IJ SOLN
INTRAMUSCULAR | Status: AC
  Filled 2017-05-30: qty 20

## 2017-05-30 MED ORDER — ATORVASTATIN CALCIUM 20 MG PO TABS
20.0000 mg | ORAL_TABLET | Freq: Every day | ORAL | Status: DC
  Administered 2017-05-30 – 2017-06-02 (×4): 20 mg via ORAL
  Filled 2017-05-30 (×4): qty 1

## 2017-05-30 MED ORDER — 0.9 % SODIUM CHLORIDE (POUR BTL) OPTIME
TOPICAL | Status: DC | PRN
  Administered 2017-05-30: 2000 mL

## 2017-05-30 MED ORDER — CEFOTETAN DISODIUM-DEXTROSE 2-2.08 GM-%(50ML) IV SOLR
2.0000 g | Freq: Two times a day (BID) | INTRAVENOUS | Status: AC
  Administered 2017-05-30: 2 g via INTRAVENOUS
  Filled 2017-05-30: qty 50

## 2017-05-30 MED ORDER — SUGAMMADEX SODIUM 200 MG/2ML IV SOLN
INTRAVENOUS | Status: AC
  Filled 2017-05-30: qty 2

## 2017-05-30 MED ORDER — PROPOFOL 10 MG/ML IV BOLUS
INTRAVENOUS | Status: AC
  Filled 2017-05-30: qty 20

## 2017-05-30 MED ORDER — FENTANYL CITRATE (PF) 100 MCG/2ML IJ SOLN: 25.0000 ug | INTRAMUSCULAR | Status: DC | PRN

## 2017-05-30 MED ORDER — LACTATED RINGERS IR SOLN
Status: DC | PRN
  Administered 2017-05-30: 1000 mL

## 2017-05-30 MED ORDER — CEFOTETAN DISODIUM-DEXTROSE 2-2.08 GM-%(50ML) IV SOLR
2.0000 g | INTRAVENOUS | Status: AC
  Administered 2017-05-30: 2 g via INTRAVENOUS
  Filled 2017-05-30: qty 50

## 2017-05-30 MED ORDER — ONDANSETRON HCL 4 MG/2ML IJ SOLN: 4.0000 mg | Freq: Four times a day (QID) | INTRAMUSCULAR | Status: DC | PRN

## 2017-05-30 MED ORDER — ONDANSETRON HCL 4 MG/2ML IJ SOLN
INTRAMUSCULAR | Status: AC
  Filled 2017-05-30: qty 2

## 2017-05-30 MED ORDER — ROCURONIUM BROMIDE 50 MG/5ML IV SOSY
PREFILLED_SYRINGE | INTRAVENOUS | Status: AC
  Filled 2017-05-30: qty 5

## 2017-05-30 MED ORDER — ENOXAPARIN SODIUM 40 MG/0.4ML ~~LOC~~ SOLN
40.0000 mg | SUBCUTANEOUS | Status: DC
  Administered 2017-05-31 – 2017-06-03 (×4): 40 mg via SUBCUTANEOUS
  Filled 2017-05-30 (×4): qty 0.4

## 2017-05-30 MED ORDER — SUGAMMADEX SODIUM 200 MG/2ML IV SOLN
INTRAVENOUS | Status: DC | PRN
  Administered 2017-05-30: 200 mg via INTRAVENOUS

## 2017-05-30 MED ORDER — LACTATED RINGERS IV SOLN
INTRAVENOUS | Status: DC
  Administered 2017-05-30 (×2): via INTRAVENOUS

## 2017-05-30 MED ORDER — ONDANSETRON HCL 4 MG/2ML IJ SOLN
INTRAMUSCULAR | Status: DC | PRN
  Administered 2017-05-30: 4 mg via INTRAVENOUS

## 2017-05-30 MED ORDER — PROPOFOL 10 MG/ML IV BOLUS
INTRAVENOUS | Status: DC | PRN
  Administered 2017-05-30: 150 mg via INTRAVENOUS

## 2017-05-30 MED ORDER — CHLORHEXIDINE GLUCONATE CLOTH 2 % EX PADS: 6.0000 | MEDICATED_PAD | Freq: Once | CUTANEOUS | Status: DC

## 2017-05-30 MED ORDER — GABAPENTIN 300 MG PO CAPS
300.0000 mg | ORAL_CAPSULE | ORAL | Status: AC
  Administered 2017-05-30: 300 mg via ORAL
  Filled 2017-05-30: qty 1

## 2017-05-30 MED ORDER — BUPIVACAINE-EPINEPHRINE (PF) 0.5% -1:200000 IJ SOLN
INTRAMUSCULAR | Status: DC | PRN
  Administered 2017-05-30: 30 mL

## 2017-05-30 MED ORDER — LIDOCAINE 2% (20 MG/ML) 5 ML SYRINGE
INTRAMUSCULAR | Status: DC | PRN
  Administered 2017-05-30: 1.5 mg/kg/h via INTRAVENOUS

## 2017-05-30 MED ORDER — ALVIMOPAN 12 MG PO CAPS
12.0000 mg | ORAL_CAPSULE | ORAL | Status: AC
  Administered 2017-05-30: 12 mg via ORAL
  Filled 2017-05-30: qty 1

## 2017-05-30 MED ORDER — FENTANYL CITRATE (PF) 250 MCG/5ML IJ SOLN
INTRAMUSCULAR | Status: DC | PRN
  Administered 2017-05-30 (×5): 50 ug via INTRAVENOUS

## 2017-05-30 MED ORDER — KETAMINE HCL-SODIUM CHLORIDE 100-0.9 MG/10ML-% IV SOSY
PREFILLED_SYRINGE | INTRAVENOUS | Status: AC
  Filled 2017-05-30: qty 10

## 2017-05-30 MED ORDER — LIDOCAINE 2% (20 MG/ML) 5 ML SYRINGE
INTRAMUSCULAR | Status: AC
  Filled 2017-05-30: qty 5

## 2017-05-30 MED ORDER — METOCLOPRAMIDE HCL 5 MG/ML IJ SOLN: 10.0000 mg | Freq: Once | INTRAMUSCULAR | Status: DC | PRN

## 2017-05-30 MED ORDER — HYDROMORPHONE HCL 1 MG/ML IJ SOLN: 1.0000 mg | INTRAMUSCULAR | Status: DC | PRN

## 2017-05-30 MED ORDER — FENTANYL CITRATE (PF) 100 MCG/2ML IJ SOLN
INTRAMUSCULAR | Status: AC
  Filled 2017-05-30: qty 2

## 2017-05-30 MED ORDER — PANTOPRAZOLE SODIUM 40 MG IV SOLR
40.0000 mg | INTRAVENOUS | Status: DC
  Administered 2017-05-30 – 2017-06-01 (×3): 40 mg via INTRAVENOUS
  Filled 2017-05-30 (×3): qty 40

## 2017-05-30 MED ORDER — ROCURONIUM BROMIDE 50 MG/5ML IV SOSY
PREFILLED_SYRINGE | INTRAVENOUS | Status: DC | PRN
  Administered 2017-05-30 (×2): 10 mg via INTRAVENOUS
  Administered 2017-05-30: 5 mg via INTRAVENOUS
  Administered 2017-05-30: 50 mg via INTRAVENOUS

## 2017-05-30 MED ORDER — SUCCINYLCHOLINE CHLORIDE 200 MG/10ML IV SOSY
PREFILLED_SYRINGE | INTRAVENOUS | Status: AC
  Filled 2017-05-30: qty 10

## 2017-05-30 MED ORDER — HYDROCODONE-ACETAMINOPHEN 5-325 MG PO TABS
1.0000 | ORAL_TABLET | ORAL | Status: DC | PRN
  Administered 2017-05-30 – 2017-06-02 (×4): 1 via ORAL
  Filled 2017-05-30 (×4): qty 1

## 2017-05-30 MED ORDER — GABAPENTIN 300 MG PO CAPS
300.0000 mg | ORAL_CAPSULE | Freq: Two times a day (BID) | ORAL | Status: DC
  Administered 2017-05-30 – 2017-06-03 (×8): 300 mg via ORAL
  Filled 2017-05-30 (×8): qty 1

## 2017-05-30 MED ORDER — FENTANYL CITRATE (PF) 250 MCG/5ML IJ SOLN
INTRAMUSCULAR | Status: AC
  Filled 2017-05-30: qty 5

## 2017-05-30 MED ORDER — POTASSIUM CHLORIDE 2 MEQ/ML IV SOLN
INTRAVENOUS | Status: DC
  Administered 2017-05-30 – 2017-06-02 (×6): via INTRAVENOUS
  Filled 2017-05-30 (×11): qty 1000

## 2017-05-30 MED ORDER — EPHEDRINE 5 MG/ML INJ
INTRAVENOUS | Status: AC
  Filled 2017-05-30: qty 10

## 2017-05-30 MED ORDER — DEXAMETHASONE SODIUM PHOSPHATE 10 MG/ML IJ SOLN
INTRAMUSCULAR | Status: AC
  Filled 2017-05-30: qty 1

## 2017-05-30 MED ORDER — KETAMINE HCL 10 MG/ML IJ SOLN
INTRAMUSCULAR | Status: DC | PRN
  Administered 2017-05-30: 30 mg via INTRAVENOUS

## 2017-05-30 MED ORDER — BUPIVACAINE-EPINEPHRINE (PF) 0.5% -1:200000 IJ SOLN
INTRAMUSCULAR | Status: AC
  Filled 2017-05-30: qty 30

## 2017-05-30 MED ORDER — MIDAZOLAM HCL 5 MG/5ML IJ SOLN
INTRAMUSCULAR | Status: DC | PRN
  Administered 2017-05-30 (×2): 1 mg via INTRAVENOUS

## 2017-05-30 MED ORDER — DEXAMETHASONE SODIUM PHOSPHATE 10 MG/ML IJ SOLN
INTRAMUSCULAR | Status: DC | PRN
  Administered 2017-05-30: 7 mg via INTRAVENOUS

## 2017-05-30 MED ORDER — SUCCINYLCHOLINE CHLORIDE 200 MG/10ML IV SOSY
PREFILLED_SYRINGE | INTRAVENOUS | Status: DC | PRN
  Administered 2017-05-30: 100 mg via INTRAVENOUS

## 2017-05-30 MED ORDER — ONDANSETRON HCL 4 MG PO TABS
4.0000 mg | ORAL_TABLET | Freq: Four times a day (QID) | ORAL | Status: DC | PRN
  Administered 2017-05-31 – 2017-06-01 (×2): 4 mg via ORAL
  Filled 2017-05-30 (×2): qty 1

## 2017-05-30 MED ORDER — ALVIMOPAN 12 MG PO CAPS
12.0000 mg | ORAL_CAPSULE | Freq: Two times a day (BID) | ORAL | Status: DC
  Administered 2017-05-31 (×2): 12 mg via ORAL
  Filled 2017-05-30 (×3): qty 1

## 2017-05-30 MED ORDER — LIDOCAINE 2% (20 MG/ML) 5 ML SYRINGE
INTRAMUSCULAR | Status: DC | PRN
  Administered 2017-05-30: 100 mg via INTRAVENOUS

## 2017-05-30 MED ORDER — MIDAZOLAM HCL 2 MG/2ML IJ SOLN
INTRAMUSCULAR | Status: AC
  Filled 2017-05-30: qty 2

## 2017-05-30 MED ORDER — ACETAMINOPHEN 500 MG PO TABS
1000.0000 mg | ORAL_TABLET | ORAL | Status: AC
  Administered 2017-05-30: 1000 mg via ORAL
  Filled 2017-05-30: qty 2

## 2017-05-30 MED ORDER — EPHEDRINE SULFATE 50 MG/ML IJ SOLN
INTRAMUSCULAR | Status: DC | PRN
  Administered 2017-05-30 (×2): 10 mg via INTRAVENOUS

## 2017-05-30 MED ORDER — MEPERIDINE HCL 50 MG/ML IJ SOLN: 6.2500 mg | INTRAMUSCULAR | Status: DC | PRN

## 2017-05-30 SURGICAL SUPPLY — 69 items
APPLIER CLIP 5 13 M/L LIGAMAX5 (MISCELLANEOUS)
APPLIER CLIP ROT 10 11.4 M/L (STAPLE) ×3
APR CLP MED LRG 11.4X10 (STAPLE) ×1
APR CLP MED LRG 5 ANG JAW (MISCELLANEOUS)
BAG URINE DRAINAGE (UROLOGICAL SUPPLIES) IMPLANT
BAG URO CATCHER STRL LF (MISCELLANEOUS) IMPLANT
BLADE EXTENDED COATED 6.5IN (ELECTRODE) ×1 IMPLANT
BLADE HEX COATED 2.75 (ELECTRODE) ×1 IMPLANT
CABLE HIGH FREQUENCY MONO STRZ (ELECTRODE) ×5 IMPLANT
CATH FOLEY SILVER 30CC 28FR (CATHETERS) IMPLANT
CELLS DAT CNTRL 66122 CELL SVR (MISCELLANEOUS) ×1 IMPLANT
CLIP APPLIE 5 13 M/L LIGAMAX5 (MISCELLANEOUS) ×1 IMPLANT
CLIP APPLIE ROT 10 11.4 M/L (STAPLE) ×1 IMPLANT
DECANTER SPIKE VIAL GLASS SM (MISCELLANEOUS) ×1 IMPLANT
DRAIN CHANNEL 19F RND (DRAIN) ×1 IMPLANT
DRAPE LAPAROSCOPIC ABDOMINAL (DRAPES) ×1 IMPLANT
DRSG OPSITE POSTOP 4X6 (GAUZE/BANDAGES/DRESSINGS) ×2 IMPLANT
DRSG TEGADERM 2-3/8X2-3/4 SM (GAUZE/BANDAGES/DRESSINGS) ×2 IMPLANT
DRSG TEGADERM 4X4.75 (GAUZE/BANDAGES/DRESSINGS) ×2 IMPLANT
ELECT REM PT RETURN 15FT ADLT (MISCELLANEOUS) ×3 IMPLANT
GAUZE SPONGE 2X2 8PLY STRL LF (GAUZE/BANDAGES/DRESSINGS) IMPLANT
GAUZE SPONGE 4X4 12PLY STRL (GAUZE/BANDAGES/DRESSINGS) ×3 IMPLANT
GLOVE BIOGEL PI IND STRL 7.0 (GLOVE) ×1 IMPLANT
GLOVE BIOGEL PI INDICATOR 7.0 (GLOVE) ×2
GLOVE ECLIPSE 8.0 STRL XLNG CF (GLOVE) ×2 IMPLANT
GLOVE INDICATOR 8.0 STRL GRN (GLOVE) ×3 IMPLANT
GOWN STRL REUS W/TWL LRG LVL3 (GOWN DISPOSABLE) ×3 IMPLANT
GOWN STRL REUS W/TWL XL LVL3 (GOWN DISPOSABLE) ×14 IMPLANT
LEGGING LITHOTOMY PAIR STRL (DRAPES) IMPLANT
LIGASURE IMPACT 36 18CM CVD LR (INSTRUMENTS) ×2 IMPLANT
NS IRRIG 1000ML POUR BTL (IV SOLUTION) ×3 IMPLANT
PACK COLON (CUSTOM PROCEDURE TRAY) ×3 IMPLANT
PUMP PAIN ON-Q (MISCELLANEOUS) ×1 IMPLANT
RETRACTOR WND ALEXIS 18 MED (MISCELLANEOUS) IMPLANT
RTRCTR WOUND ALEXIS 18CM MED (MISCELLANEOUS) ×3
SCISSORS LAP 5X35 DISP (ENDOMECHANICALS) ×3 IMPLANT
SEALER TISSUE X1 CVD JAW (INSTRUMENTS) IMPLANT
SET IRRIG TUBING LAPAROSCOPIC (IRRIGATION / IRRIGATOR) ×3 IMPLANT
SHEARS HARMONIC ACE PLUS 36CM (ENDOMECHANICALS) ×2 IMPLANT
SLEEVE ADV FIXATION 5X100MM (TROCAR) ×2 IMPLANT
SOLUTION ANTI FOG 6CC (MISCELLANEOUS) ×1 IMPLANT
SPONGE GAUZE 2X2 STER 10/PKG (GAUZE/BANDAGES/DRESSINGS) ×2
STAPLER PROXIMATE 75MM BLUE (STAPLE) ×2 IMPLANT
STAPLER VISISTAT 35W (STAPLE) ×3 IMPLANT
SUT PDS AB 1 CTX 36 (SUTURE) ×2 IMPLANT
SUT PDS AB 1 TP1 96 (SUTURE) ×4 IMPLANT
SUT PROLENE 2 0 KS (SUTURE) IMPLANT
SUT SILK 2 0 (SUTURE) ×3
SUT SILK 2 0 SH CR/8 (SUTURE) ×3 IMPLANT
SUT SILK 2-0 18XBRD TIE 12 (SUTURE) ×1 IMPLANT
SUT SILK 3 0 (SUTURE) ×3
SUT SILK 3 0 SH CR/8 (SUTURE) ×9 IMPLANT
SUT SILK 3-0 18XBRD TIE 12 (SUTURE) ×1 IMPLANT
SUT VIC AB 2-0 SH 18 (SUTURE) ×2 IMPLANT
SUT VICRYL 2 0 18  UND BR (SUTURE)
SUT VICRYL 2 0 18 UND BR (SUTURE) ×2 IMPLANT
SYR 30ML LL (SYRINGE) IMPLANT
SYRINGE IRR TOOMEY STRL 70CC (SYRINGE) IMPLANT
SYS LAPSCP GELPORT 120MM (MISCELLANEOUS)
SYSTEM LAPSCP GELPORT 120MM (MISCELLANEOUS) IMPLANT
TRAY FOLEY CATH 14FRSI W/METER (CATHETERS) ×2 IMPLANT
TROCAR ADV FIXATION 5X100MM (TROCAR) ×6 IMPLANT
TROCAR BLADELESS OPT 5 100 (ENDOMECHANICALS) ×2 IMPLANT
TROCAR XCEL NON-BLD 11X100MML (ENDOMECHANICALS) ×3 IMPLANT
TROCAR XCEL UNIV SLVE 11M 100M (ENDOMECHANICALS) IMPLANT
TUBING CONNECTING 10 (TUBING) ×2 IMPLANT
TUBING CONNECTING 10' (TUBING) ×2
TUBING INSUF HEATED (TUBING) ×3 IMPLANT
YANKAUER SUCT BULB TIP NO VENT (SUCTIONS) ×1 IMPLANT

## 2017-05-30 NOTE — Op Note (Signed)
Patient Name:           Jack Gray   Date of Surgery:        05/30/2017  Pre op Diagnosis:      Cancer proximal sigmoid colon  Post op Diagnosis:    same  Procedure:                 Laparoscopic-assisted left colectomy                                      Takedown splenic flexure                                       Proctoscopy  Surgeon:                     Edsel Petrin. Dalbert Batman, M.D., FACS  Assistant:                      Leighton Ruff, M.D.   Indication for Assistant: Complex exposure and dissection  Operative Indications:     This is a very pleasant and very healthy 71 year old gentleman, referred by Dr. 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 Dr. Jacelyn Grip is his PCP .        Patient states he's had some colon polyps in the past. Recent colonoscopy 3 years after his last was done electively. No symptoms. It shows a non-circumferential ulcerated mass at 40 cm mid to proximal sigmoid. 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 MSI stable. Lynch syndrome felt to be unlikely. CEA 2.3. Creatinine 1.41. LFTs normal. CT scan shows a 3 cm mass in the proximal sigmoid. There are some borderline mesenteric nodes. The CT said there was a 2 cm rounded mass in the second portion of the duodenum Upper endoscopy was done earlier this week and shows a little bit of gastritis but there was no duodenal mass whatsoever.      Past history is significant only for hyperlipidemia and colon polyps. He is healthy. Family history is negative for colon cancer or breast cancer. One sister died of melanoma possibly metastatic to the lungs. One sister living several years after head and neck cancer stage I He will be scheduled for laparoscopic-assisted left colectomy, possible splenic flexure takedown.  He underwent a bowel prep at home yesterday and is brought to the operating room electively  Operative Findings:       There was a palpable mass  in the proximal sigmoid colon.  There is no gross adenopathy.  There is no evidence of metastatic disease.  He had some fatty omental adhesions in the left upper quadrant tethering the transverse colon to the abdominal wall and these were chronic.  The sigmoid colon was tethered to the left lower quadrant because of some chronic adhesions.  We were able to do a high ligation of the inferior mesenteric artery and inferior mesenteric vein.  We took down the splenic flexure.  The proximal margin appeared to be 7 or 8 cm and the distal margin was probably 25 cm.  The anastomosis was handsewn single layer 2-0 silk.  Proctoscopy and then of the case showed no air leak.  Procedure in Detail:          Following the induction of  general endotracheal anesthesia a Foley catheter and oral gastric tube were placed.  Surgical timeout was performed.  Intravenous antibiotics were given.  The patient was placed in padded stirrups and the abdomen and perineum were prepped and draped in a sterile fashion.     A Hassan trocar was placed in the midline just above the umbilicus and secured with the Purstring suture of 0 Vicryl.  Pneumoperitoneum was created and video camera was inserted.  2 trochars were placed on the low right abdomen and one in the left lower quadrant.  The extraction site was lower midline.      We divided the adhesions in the left lower quadrant and identified the cancer.  We mobilized the descending colon and sigmoid colon from lateral to medial.  We identified the ureter initially from the lateral aspect and then later from the medial aspect.  We took down the entire descending colon and splenic flexure using blunt dissection and the harmonic scalpel.  We took down all of the omental adhesions in the left upper quadrant which L mobilized transverse colon quite nicely.  This was extensive mobilization allowed [name] to perform a wide resection and anastomosis with no tension.      After mobilizing the splenic  flexure I lifted the colon up and incised the medial mesentery posteriorly.  I created a window inferior to the inferior mesenteric artery.  I opened this window up and identified the left ureter.  At this point we have full mobilization.  I made an 8 cm midline incision in the lower midline.  The fascia was incised in the midline.  Self-retaining retractor was placed.  I brought the entire colon specimen out without any difficulty at all.  Identified the inferior mesenteric artery and it was clamped near its origin, divided,  and ligated with 2-0 silk ties.  I divided the proximal colon About 8 cm proximal to the tumor with a GIA stapling device.  Mesenteric vessels were taken down with LigaSure device.  We took the mesenteric dissection all the way down to the proximal rectum.  We divided the proximal rectum between [facility] clamps.  We removed the specimen and marked the distal margin with a silk suture.  We irrigated a little bit.  There was no bleeding anywhere.  We will set up the anastomosis in the wound without any difficulty.  The anastomosis was created with interrupted 2-0 silk in a single layer.  Corner sutures were placed and held on traction.  Stay suture was placed in the posterior midline of the anastomosis.  Interrupted silk sutures were used to complete the posterior and anterior wall in the usual technique.  We had excellent bleeding from proximal distal ends and there was no tension.  We then irrigated out the abdomen or pelvis and there was no bleeding.  Proctoscopy was performed and leak test was performed and there was no air leak.  There was some liquid stool present but no bleeding.       We then changed our instruments, drapes, gowns and gloves.  We irrigated the abdomen and pelvis again.  There was no bleeding.  The fascia in the lower midline was closed with a running suture of #1 double stranded PDS.  The fascia at the trocar site just above the umbilicus was closed with a figure-of-eight  suture of 0 Vicryl.  All the skin incisions were irrigated and closed with skin staples.  Clean bandages were placed and patient taken to PACU in stable  condition.  EBL 75 mL or less.  Counts correct.  Complications none.       Edsel Petrin. Dalbert Batman, M.D., FACS General and Minimally Invasive Surgery Breast and Colorectal Surgery  05/30/2017 11:47 AM

## 2017-05-30 NOTE — Anesthesia Procedure Notes (Signed)
Anesthesia Regional Block: TAP block   Pre-Anesthetic Checklist: ,, timeout performed, Correct Patient, Correct Site, Correct Laterality, Correct Procedure, Correct Position, site marked, Risks and benefits discussed,  Surgical consent,  Pre-op evaluation,  At surgeon's request and post-op pain management  Laterality: Left  Prep: Maximum Sterile Barrier Precautions used, chloraprep       Needles:  Injection technique: Single-shot  Needle Type: Echogenic Stimulator Needle     Needle Length: 10cm      Additional Needles:   Procedures:,,,, ultrasound used (permanent image in chart),,,,  Narrative:  Start time: 05/30/2017 8:00 AM End time: 05/30/2017 8:05 AM Injection made incrementally with aspirations every 5 mL.  Performed by: Personally  Anesthesiologist: Montez Hageman  Additional Notes: Risks, benefits and alternative to block explained extensively.  Patient tolerated procedure well, without complications.

## 2017-05-30 NOTE — Interval H&P Note (Signed)
History and Physical Interval Note:  05/30/2017 8:00 AM  Jack Gray  has presented today for surgery, with the diagnosis of COLON CANCER   The various methods of treatment have been discussed with the patient and family. After consideration of risks, benefits and other options for treatment, the patient has consented to  Procedure(s) with comments: LAPAROSCOPIC ASSISTED LEFT COLECTOMY (Left) - GENERAL AND TAP BLOCK  as a surgical intervention .  The patient's history has been reviewed, patient examined, no change in status, stable for surgery.  I have reviewed the patient's chart and labs.  Questions were answered to the patient's satisfaction.     Adin Hector

## 2017-05-30 NOTE — Progress Notes (Signed)
AssistedDr. Marcell Barlow with right, left, ultrasound guided, transabdominal plane blocks. Side rails up, monitors on throughout procedure. See vital signs in flow sheet. Tolerated Procedure well.

## 2017-05-30 NOTE — Anesthesia Procedure Notes (Signed)
Procedure Name: Intubation Date/Time: 05/30/2017 8:43 AM Performed by: Maxwell Caul Pre-anesthesia Checklist: Patient identified, Emergency Drugs available, Suction available and Patient being monitored Patient Re-evaluated:Patient Re-evaluated prior to induction Oxygen Delivery Method: Circle system utilized Preoxygenation: Pre-oxygenation with 100% oxygen Induction Type: IV induction Ventilation: Mask ventilation without difficulty Laryngoscope Size: Mac and 4 Grade View: Grade I Tube type: Oral Tube size: 7.5 mm Number of attempts: 1 Airway Equipment and Method: Stylet Placement Confirmation: ETT inserted through vocal cords under direct vision,  positive ETCO2 and breath sounds checked- equal and bilateral Secured at: 21 cm Tube secured with: Tape Dental Injury: Teeth and Oropharynx as per pre-operative assessment

## 2017-05-30 NOTE — Anesthesia Preprocedure Evaluation (Signed)
Anesthesia Evaluation  Patient identified by MRN, date of birth, ID band Patient awake    Reviewed: Allergy & Precautions, NPO status , Patient's Chart, lab work & pertinent test results  Airway Mallampati: II  TM Distance: >3 FB Neck ROM: Full    Dental no notable dental hx.    Pulmonary neg pulmonary ROS,    Pulmonary exam normal breath sounds clear to auscultation       Cardiovascular negative cardio ROS Normal cardiovascular exam Rhythm:Regular Rate:Normal     Neuro/Psych negative neurological ROS  negative psych ROS   GI/Hepatic negative GI ROS, Neg liver ROS,   Endo/Other  negative endocrine ROS  Renal/GU negative Renal ROS  negative genitourinary   Musculoskeletal negative musculoskeletal ROS (+)   Abdominal   Peds negative pediatric ROS (+)  Hematology negative hematology ROS (+)   Anesthesia Other Findings   Reproductive/Obstetrics negative OB ROS                             Anesthesia Physical Anesthesia Plan  ASA: I  Anesthesia Plan: General   Post-op Pain Management:  Regional for Post-op pain   Induction: Intravenous  PONV Risk Score and Plan: 3 and Ondansetron, Dexamethasone, Midazolam and Treatment may vary due to age or medical condition  Airway Management Planned: Oral ETT  Additional Equipment:   Intra-op Plan:   Post-operative Plan: Extubation in OR  Informed Consent: I have reviewed the patients History and Physical, chart, labs and discussed the procedure including the risks, benefits and alternatives for the proposed anesthesia with the patient or authorized representative who has indicated his/her understanding and acceptance.   Dental advisory given  Plan Discussed with: CRNA  Anesthesia Plan Comments:         Anesthesia Quick Evaluation

## 2017-05-30 NOTE — Transfer of Care (Signed)
Immediate Anesthesia Transfer of Care Note  Patient: Jack Gray  Procedure(s) Performed: Procedure(s) with comments: LAPAROSCOPIC ASSISTED LEFT COLECTOMY (Left) - GENERAL AND TAP BLOCK   Patient Location: PACU  Anesthesia Type:General  Level of Consciousness:  sedated, patient cooperative and responds to stimulation  Airway & Oxygen Therapy:Patient Spontanous Breathing and Patient connected to face mask oxgen  Post-op Assessment:  Report given to PACU RN and Post -op Vital signs reviewed and stable  Post vital signs:  Reviewed and stable  Last Vitals:  Vitals:   05/30/17 0827 05/30/17 0828  BP:    Pulse: 88 92  Resp:    Temp:    SpO2: 702% 637%    Complications: No apparent anesthesia complications

## 2017-05-30 NOTE — Anesthesia Procedure Notes (Signed)
Anesthesia Regional Block: TAP block   Pre-Anesthetic Checklist: ,, timeout performed, Correct Patient, Correct Site, Correct Laterality, Correct Procedure, Correct Position, site marked, Risks and benefits discussed,  Surgical consent,  Pre-op evaluation,  At surgeon's request and post-op pain management  Laterality: Right  Prep: Maximum Sterile Barrier Precautions used, chloraprep       Needles:  Injection technique: Single-shot  Needle Type: Echogenic Stimulator Needle     Needle Length: 10cm      Additional Needles:   Procedures:,,,, ultrasound used (permanent image in chart),,,,  Narrative:  Start time: 05/30/2017 8:06 AM End time: 05/30/2017 8:11 AM Injection made incrementally with aspirations every 5 mL.  Performed by: Personally  Anesthesiologist: Montez Hageman  Additional Notes: Risks, benefits and alternative to block explained extensively.  Patient tolerated procedure well, without complications.

## 2017-05-30 NOTE — Anesthesia Postprocedure Evaluation (Signed)
Anesthesia Post Note  Patient: CHAE OOMMEN  Procedure(s) Performed: LAPAROSCOPIC ASSISTED LEFT COLECTOMY (Left Abdomen)     Patient location during evaluation: PACU Anesthesia Type: General and Regional Level of consciousness: awake and alert Pain management: pain level controlled Vital Signs Assessment: post-procedure vital signs reviewed and stable Respiratory status: spontaneous breathing, nonlabored ventilation, respiratory function stable and patient connected to nasal cannula oxygen Cardiovascular status: blood pressure returned to baseline and stable Postop Assessment: no apparent nausea or vomiting Anesthetic complications: no Comments: Bilateral TAP blocks for post op pain control    Last Vitals:  Vitals:   05/30/17 1245 05/30/17 1259  BP: (!) 152/89 (!) 152/86  Pulse: 94 99  Resp: 20 20  Temp: 37.2 C 36.7 C  SpO2: 99% 99%    Last Pain:  Vitals:   05/30/17 1300  TempSrc:   PainSc: 2                  Montez Hageman

## 2017-05-31 LAB — CBC
HEMATOCRIT: 33.7 % — AB (ref 39.0–52.0)
HEMOGLOBIN: 11.7 g/dL — AB (ref 13.0–17.0)
MCH: 30.8 pg (ref 26.0–34.0)
MCHC: 34.7 g/dL (ref 30.0–36.0)
MCV: 88.7 fL (ref 78.0–100.0)
Platelets: 138 10*3/uL — ABNORMAL LOW (ref 150–400)
RBC: 3.8 MIL/uL — AB (ref 4.22–5.81)
RDW: 12.9 % (ref 11.5–15.5)
WBC: 9.5 10*3/uL (ref 4.0–10.5)

## 2017-05-31 LAB — BASIC METABOLIC PANEL
ANION GAP: 6 (ref 5–15)
BUN: 12 mg/dL (ref 6–20)
CHLORIDE: 106 mmol/L (ref 101–111)
CO2: 29 mmol/L (ref 22–32)
Calcium: 9.1 mg/dL (ref 8.9–10.3)
Creatinine, Ser: 1.24 mg/dL (ref 0.61–1.24)
GFR calc Af Amer: >60 mL/min (ref >60)
GFR calc non Af Amer: 57 mL/min — ABNORMAL LOW (ref >60)
GLUCOSE: 119 mg/dL — AB (ref 65–99)
Potassium: 4.7 mmol/L (ref 3.5–5.1)
Sodium: 141 mmol/L (ref 135–145)

## 2017-05-31 NOTE — Progress Notes (Signed)
1 Day Post-Op  Subjective: Stable and alert.xcellent urine output. Very comfortable with minimal pain Denies nausea and would like to drink liquids Requesting Foley removal Ambulating in halls Hemoglobin 11.7.  Potassium 4.7.  Creatinine 1.24.  Objective: Vital signs in last 24 hours: Temp:  [97.6 F (36.4 C)-99 F (37.2 C)] 99 F (37.2 C) (10/25 0622) Pulse Rate:  [58-104] 71 (10/25 0622) Resp:  [14-20] 18 (10/25 0622) BP: (101-152)/(62-89) 114/62 (10/25 0622) SpO2:  [96 %-100 %] 99 % (10/25 0622) Last BM Date: 05/30/17  Intake/Output from previous day: 10/24 0701 - 10/25 0700 In: 3527.1 [I.V.:3527.1] Out: 2151 [Urine:2101; Blood:50] Intake/Output this shift: Total I/O In: 583.3 [I.V.:583.3] Out: -   General appearance: alert.  Mental status normal.  Friendly.  No distress Resp: clear to auscultation bilaterally GI: soft.  Nondistended.  Wounds clean and dry.  Some bowel sounds present. Extremities: extremities normal, atraumatic, no cyanosis or edema  Lab Results:  Results for orders placed or performed during the hospital encounter of 05/30/17 (from the past 24 hour(s))  CBC     Status: Abnormal   Collection Time: 05/30/17  1:59 PM  Result Value Ref Range   WBC 11.1 (H) 4.0 - 10.5 K/uL   RBC 4.11 (L) 4.22 - 5.81 MIL/uL   Hemoglobin 12.6 (L) 13.0 - 17.0 g/dL   HCT 36.0 (L) 39.0 - 52.0 %   MCV 87.6 78.0 - 100.0 fL   MCH 30.7 26.0 - 34.0 pg   MCHC 35.0 30.0 - 36.0 g/dL   RDW 12.5 11.5 - 15.5 %   Platelets 145 (L) 150 - 400 K/uL  Creatinine, serum     Status: Abnormal   Collection Time: 05/30/17  1:59 PM  Result Value Ref Range   Creatinine, Ser 1.28 (H) 0.61 - 1.24 mg/dL   GFR calc non Af Amer 55 (L) >60 mL/min   GFR calc Af Amer >60 >60 mL/min  Basic metabolic panel     Status: Abnormal   Collection Time: 05/31/17  5:26 AM  Result Value Ref Range   Sodium 141 135 - 145 mmol/L   Potassium 4.7 3.5 - 5.1 mmol/L   Chloride 106 101 - 111 mmol/L   CO2 29 22 -  32 mmol/L   Glucose, Bld 119 (H) 65 - 99 mg/dL   BUN 12 6 - 20 mg/dL   Creatinine, Ser 1.24 0.61 - 1.24 mg/dL   Calcium 9.1 8.9 - 10.3 mg/dL   GFR calc non Af Amer 57 (L) >60 mL/min   GFR calc Af Amer >60 >60 mL/min   Anion gap 6 5 - 15  CBC     Status: Abnormal   Collection Time: 05/31/17  5:26 AM  Result Value Ref Range   WBC 9.5 4.0 - 10.5 K/uL   RBC 3.80 (L) 4.22 - 5.81 MIL/uL   Hemoglobin 11.7 (L) 13.0 - 17.0 g/dL   HCT 33.7 (L) 39.0 - 52.0 %   MCV 88.7 78.0 - 100.0 fL   MCH 30.8 26.0 - 34.0 pg   MCHC 34.7 30.0 - 36.0 g/dL   RDW 12.9 11.5 - 15.5 %   Platelets 138 (L) 150 - 400 K/uL     Studies/Results: No results found.  Marland Kitchen alvimopan  12 mg Oral BID  . atorvastatin  20 mg Oral q1800  . enoxaparin (LOVENOX) injection  40 mg Subcutaneous Q24H  . gabapentin  300 mg Oral BID  . pantoprazole (PROTONIX) IV  40 mg Intravenous Q24H  Assessment/Plan: s/p Procedure(s): LAPAROSCOPIC ASSISTED LEFT COLECTOMY  POD #1.  Laparoscopic assisted left colectomy, takedown splenic flexure, proctoscopy Doing well Start clear liquid diet Entereg Remove Foley mobilize Await pathology Outpatient medical oncology referral  Hyperlipidemia.  On Lipitor. DVT prophylaxis.  SCDs and Lovenox  @PROBHOSP @  LOS: 1 day    Shekira Drummer M 05/31/2017  . .prob

## 2017-06-01 NOTE — Progress Notes (Signed)
Pharmacy Brief Note - Alvimopan (Entereg)  The standing order set for alvimopan (Entereg) now includes an automatic order to discontinue the drug after the patient has had a bowel movement.  The change was approved by the [place] and the Medical Executive Committee.    This patient has had a bowel movement documented.  Therefore, alvimopan has been discontinued.  If there are questions, please contact the pharmacy at [phone].  Thank you  Gretta Arab PharmD, BCPS Pager [phone] 06/01/2017 2:33 PM

## 2017-06-01 NOTE — Progress Notes (Signed)
Nutrition Education Note  RD consulted for nutrition education regarding a Low Fiber/Low Residue diet.   RD provided "Low Fiber Nutrition Therapy" handout from the Academy of [employer]. Discussed nutrition therapy to reduce the irritation of the gastrointestinal tract to promote healing. Provided list of recommended low fiberous foods in comparison to foods with high amounts of fiber, as well as a recommended sample day. Teach back method used.  Expect great compliance. Pt very knowledgeable about all food groups. Understands how to read nutrition labels. Has very good support at home. Discussed increasing fiber once cleared by GI. Provided high fiber handout as well.   Body mass index is 24.33 kg/m. Pt meets criteria for overweight based on current BMI.  Current diet order is full liquid, patient is consuming approximately 100% of meals at this time. Labs and medications reviewed. RD contact information provided. If additional nutrition issues arise, please re-consult RD.  Mariana Single RD, LDN Clinical Nutrition Pager # [phone]

## 2017-06-01 NOTE — Progress Notes (Signed)
2 Days Post-Op  Subjective: Table and alert.  Temp 99.5. Heart rate 88.  SPO2 99% room air Ambulating in halls several times a day and doing well with this Tolerating clear liquids but doesn't have much appetite Past flatus and had 2 liquid stools.  Was incontinent during the night.  He's worried about that.  I reassured him Voiding without difficulty.  Excellent urine output Pathology pending.  I told him this might come out this afternoon or Monday  Objective: Vital signs in last 24 hours: Temp:  [98.7 F (37.1 C)-99.5 F (37.5 C)] 98.7 F (37.1 C) (10/26 0523) Pulse Rate:  [71-96] 88 (10/26 0523) Resp:  [17-18] 17 (10/26 0523) BP: (114-125)/(60-75) 118/60 (10/26 0523) SpO2:  [95 %-99 %] 99 % (10/26 0523) Last BM Date: 05/31/17  Intake/Output from previous day: 10/25 0701 - 10/26 0700 In: 2923.3 [P.O.:1740; I.V.:1183.3] Out: 3850 [Urine:3850] Intake/Output this shift: Total I/O In: 1260 [P.O.:660; I.V.:600] Out: 1500 [Urine:1500]  General appearance: alert and talkative.  Skin warm and dry.  Looks well.  Not in any distress.  Wife present Resp: clear to auscultation bilaterally GI: abdomen soft.  Not distended.  Active bowel sounds.  Wounds clean.  Minimally tender.  Unremarkable postop exam.  Lab Results:  No results found for this or any previous visit (from the past 24 hour(s)).   Studies/Results: No results found.  Marland Kitchen alvimopan  12 mg Oral BID  . atorvastatin  20 mg Oral q1800  . enoxaparin (LOVENOX) injection  40 mg Subcutaneous Q24H  . gabapentin  300 mg Oral BID  . pantoprazole (PROTONIX) IV  40 mg Intravenous Q24H     Assessment/Plan: s/p Procedure(s): LAPAROSCOPIC ASSISTED LEFT COLECTOMY  POD #2  Laparoscopic assisted left colectomy, takedown splenic flexure, proctoscopy  basically doing well GI function is returning but is not normal yet We will let him decide when to advance from clear liquids to full liquids and supplements Eventually discharge  home on soft diet Entereg mobilize Await pathology Outpatient medical oncology referral-[facility]  Hyperlipidemia.  On Lipitor. DVT prophylaxis.  SCDs and Lovenox   @PROBHOSP @  LOS: 2 days    Jack Gray M 06/01/2017  . .prob

## 2017-06-02 MED ORDER — PANTOPRAZOLE SODIUM 40 MG PO TBEC
40.0000 mg | DELAYED_RELEASE_TABLET | Freq: Every day | ORAL | Status: DC
  Administered 2017-06-02: 40 mg via ORAL
  Filled 2017-06-02: qty 1

## 2017-06-02 NOTE — Progress Notes (Signed)
3 Days Post-Op   Subjective/Chief Complaint: No complaints. Feeling better. Having bm's   Objective: Vital signs in last 24 hours: Temp:  [97.7 F (36.5 C)-99.5 F (37.5 C)] 98.2 F (36.8 C) (10/27 0603) Pulse Rate:  [79-88] 79 (10/27 0603) Resp:  [16-18] 18 (10/27 0603) BP: (114-137)/(72-80) 116/72 (10/27 0603) SpO2:  [95 %] 95 % (10/27 0603) Last BM Date: 06/01/17  Intake/Output from previous day: 10/26 0701 - 10/27 0700 In: 2780 [P.O.:1260; I.V.:1520] Out: 700 [Urine:700] Intake/Output this shift: Total I/O In: 360 [P.O.:360] Out: -   General appearance: alert and cooperative Resp: clear to auscultation bilaterally Cardio: regular rate and rhythm GI: soft, minimal tenderness. good bs. incision looks good  Lab Results:   Recent Labs  05/30/17 1359 05/31/17 0526  WBC 11.1* 9.5  HGB 12.6* 11.7*  HCT 36.0* 33.7*  PLT 145* 138*   BMET  Recent Labs  05/30/17 1359 05/31/17 0526  NA  --  141  K  --  4.7  CL  --  106  CO2  --  29  GLUCOSE  --  119*  BUN  --  12  CREATININE 1.28* 1.24  CALCIUM  --  9.1   PT/INR No results for input(s): LABPROT, INR in the last 72 hours. ABG No results for input(s): PHART, HCO3 in the last 72 hours.  Invalid input(s): PCO2, PO2  Studies/Results: No results found.  Anti-infectives: Anti-infectives    Start     Dose/Rate Route Frequency Ordered Stop   05/30/17 2100  cefoTEtan in Dextrose 5% (CEFOTAN) IVPB 2 g     2 g Intravenous Every 12 hours 05/30/17 1305 05/30/17 2032   05/30/17 0630  cefoTEtan in Dextrose 5% (CEFOTAN) IVPB 2 g     2 g Intravenous On call to O.R. 05/30/17 0630 05/30/17 0906      Assessment/Plan: s/p Procedure(s) with comments: LAPAROSCOPIC ASSISTED LEFT COLECTOMY (Left) - GENERAL AND TAP BLOCK  Advance diet. Start soft foods ambulate  LOS: 3 days    TOTH III,Kiri Hinderliter S 06/02/2017

## 2017-06-03 MED ORDER — ABDOMINAL BINDER/ELASTIC MED MISC
1.0000 | Freq: Once | 0 refills | Status: AC
Start: 2017-06-03 — End: 2017-06-03

## 2017-06-03 MED ORDER — HYDROCODONE-ACETAMINOPHEN 5-325 MG PO TABS: 1.0000 | ORAL_TABLET | Freq: Four times a day (QID) | ORAL | 0 refills | Status: DC | PRN

## 2017-06-03 NOTE — Progress Notes (Signed)
4 Days Post-Op   Subjective/Chief Complaint: Feels good. No complaints   Objective: Vital signs in last 24 hours: Temp:  [98 F (36.7 C)-98.4 F (36.9 C)] 98 F (36.7 C) (10/28 0615) Pulse Rate:  [71-87] 71 (10/28 0615) Resp:  [18] 18 (10/28 0615) BP: (119-131)/(71-74) 123/71 (10/28 0615) SpO2:  [97 %] 97 % (10/28 0615) Last BM Date: 06/01/17  Intake/Output from previous day: 10/27 0701 - 10/28 0700 In: 1704.2 [P.O.:1270; I.V.:434.2] Out: 1990 [ZSMOL:0786] Intake/Output this shift: Total I/O In: 300 [P.O.:300] Out: 350 [Urine:350]  General appearance: alert and cooperative Resp: clear to auscultation bilaterally Cardio: regular rate and rhythm GI: soft, minimal tenderness. incision good  Lab Results:  No results for input(s): WBC, HGB, HCT, PLT in the last 72 hours. BMET No results for input(s): NA, K, CL, CO2, GLUCOSE, BUN, CREATININE, CALCIUM in the last 72 hours. PT/INR No results for input(s): LABPROT, INR in the last 72 hours. ABG No results for input(s): PHART, HCO3 in the last 72 hours.  Invalid input(s): PCO2, PO2  Studies/Results: No results found.  Anti-infectives: Anti-infectives    Start     Dose/Rate Route Frequency Ordered Stop   05/30/17 2100  cefoTEtan in Dextrose 5% (CEFOTAN) IVPB 2 g     2 g Intravenous Every 12 hours 05/30/17 1305 05/30/17 2032   05/30/17 0630  cefoTEtan in Dextrose 5% (CEFOTAN) IVPB 2 g     2 g Intravenous On call to O.R. 05/30/17 0630 05/30/17 0906      Assessment/Plan: s/p Procedure(s) with comments: LAPAROSCOPIC ASSISTED LEFT COLECTOMY (Left) - GENERAL AND TAP BLOCK  Advance diet Discharge  LOS: 4 days    TOTH III,PAUL S 06/03/2017

## 2017-06-06 ENCOUNTER — Other Ambulatory Visit: Payer: Self-pay | Admitting: General Surgery

## 2017-06-07 ENCOUNTER — Encounter (HOSPITAL_BASED_OUTPATIENT_CLINIC_OR_DEPARTMENT_OTHER): Payer: Self-pay | Admitting: *Deleted

## 2017-06-07 NOTE — Progress Notes (Signed)
Ensure pre surgery drink given with instructions to complete by 0600 dos, pt verbalized understanding. 

## 2017-06-08 NOTE — H&P (Signed)
[name] Location: [facility] Patient #: [id number] DOB: 1946-04-10 Unknown / Language: Cleophus Molt / Race: White Male   History of Present Illness  The patient is a 71 year old male who presents with colorectal cancer. This is a very pleasant and very healthy 71 year old gentleman, referred by Dr. 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 Dr. Jacelyn Grip is his PCP. They request Dr. Burney Gauze as their medical oncologist  Patient states he's had some colon polyps in the past. Recent colonoscopy 3 years after his last was done electively. No symptoms. It shows a non-circumferential ulcerated mass at 40 cm mid to proximal sigmoid. 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 MSI stable. Lynch syndrome felt to be unlikely. CEA 2.3. Creatinine 1.41. LFTs normal. CT scan shows a 3 cm mass in the proximal sigmoid. There are some borderline mesenteric nodes. The CT said there was a 2 cm rounded mass in the second portion of the duodenum Upper endoscopy was done  and shows a little bit of gastritis but there was no duodenal mass whatsoever.  On Oct. 24, 2018 he underwent lap assisted Left colectomy. He is recovering uneventfully. Pathology shows 2/14 nodes replaced by tumor. Dr. Marin Olp requests port placement to facilitate adjuvant chemotherapy. He is brought to there operating room electively for port placement.  Past history is significant only for hyperlipidemia and colon polyps. He is healthy. Family history is negative for colon cancer or breast cancer. One sister died of melanoma possibly metastatic to the lungs. One sister living several years after head and neck cancer stage I Physical history reveals that he lives in [place]. He is married. There is one daughter here with him. One child died at age 67 in the sleep of unknown cause. Denies tobacco. Alcohol occasionally The daughter is with him today lost  her husband to metastatic colon cancer which is very stressful for her admittedly.      Past Surgical History Colon Polyp Removal - Colonoscopy  Open Inguinal Hernia Surgery  Bilateral.  Diagnostic Studies History  Colonoscopy  within last year  Allergies  No Known Drug Allergies  Demerol *ANALGESICS - OPIOID*   Medication History  PreserVision AREDS 2 (Oral) Active. Multi Vitamin Daily (Oral) Active. Vitamin D (Cholecalciferol) (1000UNIT Capsule, Oral) Active. Glucosamine (500MG Capsule, Oral) Active. Medications Reconciled  Social History ( Alcohol use  Occasional alcohol use. Caffeine use  Tea. No drug use  Tobacco use  Never smoker.  Family History  Alcohol Abuse  Father. Arthritis  Sister. Melanoma  Sister. Respiratory Condition  Mother.  Other Problems  Colon Cancer  Hemorrhoids  Hypercholesterolemia  Inguinal Hernia  Kidney Stone  Umbilical Hernia Repair     Review of Systems  General Not Present- Appetite Loss, Chills, Fatigue, Fever, Night Sweats, Weight Gain and Weight Loss. Skin Not Present- Change in Wart/Mole, Dryness, Hives, Jaundice, New Lesions, Non-Healing Wounds, Rash and Ulcer. HEENT Not Present- Earache, Hearing Loss, Hoarseness, Nose Bleed, Oral Ulcers, Ringing in the Ears, Seasonal Allergies, Sinus Pain, Sore Throat, Visual Disturbances, Wears glasses/contact lenses and Yellow Eyes. Respiratory Present- Snoring. Not Present- Bloody sputum, Chronic Cough, Difficulty Breathing and Wheezing. Breast Not Present- Breast Mass, Breast Pain, Nipple Discharge and Skin Changes. Cardiovascular Not Present- Chest Pain, Difficulty Breathing Lying Down, Leg Cramps, Palpitations, Rapid Heart Rate, Shortness of Breath and Swelling of Extremities. Gastrointestinal Present- Hemorrhoids. Not Present- Abdominal Pain, Bloating, Bloody Stool, Change in Bowel Habits, Chronic diarrhea, Constipation, Difficulty Swallowing, Excessive gas, Gets  full quickly at meals, Indigestion, Nausea, Rectal Pain and Vomiting. Musculoskeletal Not Present- Back Pain, Joint Pain, Joint Stiffness, Muscle Pain, Muscle Weakness and Swelling of Extremities. Neurological Not Present- Decreased Memory, Fainting, Headaches, Numbness, Seizures, Tingling, Tremor, Trouble walking and Weakness. Psychiatric Not Present- Anxiety, Bipolar, Change in Sleep Pattern, Depression, Fearful and Frequent crying. Endocrine Not Present- Cold Intolerance, Excessive Hunger, Hair Changes, Heat Intolerance, Hot flashes and New Diabetes. Hematology Not Present- Blood Thinners, Easy Bruising, Excessive bleeding, Gland problems, HIV and Persistent Infections.  Vitals  Weight: 164.25 lb Height: 68in Body Surface Area: 1.88 m Body Mass Index: 24.97 kg/m  Temp.: 98.83F  Pulse: 89 (Regular)  BP: 122/80 (Sitting, Left Arm, Standard)     Physical Exam  General Mental Status-Alert. General Appearance-Consistent with stated age. Hydration-Well hydrated. Voice-Normal. Note: He is fit and healthy for his age. BMI 25.   Head and Neck Head-normocephalic, atraumatic with no lesions or palpable masses. Trachea-midline. Thyroid Gland Characteristics - normal size and consistency.  Eye Eyeball - Bilateral-Extraocular movements intact. Sclera/Conjunctiva - Bilateral-No scleral icterus.  Chest and Lung Exam Chest and lung exam reveals -quiet, even and easy respiratory effort with no use of accessory muscles and on auscultation, normal breath sounds, no adventitious sounds and normal vocal resonance. Inspection Chest Wall - Normal. Back - normal.  Cardiovascular Cardiovascular examination reveals -normal heart sounds, regular rate and rhythm with no murmurs and normal pedal pulses bilaterally.  Abdomen Inspection Inspection of the abdomen reveals - No Hernias. Skin - Scar - healing well Palpation/Percussion Palpation and Percussion of the  abdomen reveal - Soft, Non Tender, No Rebound tenderness, No Rigidity (guarding) and No hepatosplenomegaly. Auscultation Auscultation of the abdomen reveals - Bowel sounds normal. Note: Soft. No scars. No mass. No hernia.   Neurologic Neurologic evaluation reveals -alert and oriented x 3 with no impairment of recent or remote memory. Mental Status-Normal.  Musculoskeletal Normal Exam - Left-Upper Extremity Strength Normal and Lower Extremity Strength Normal. Normal Exam - Right-Upper Extremity Strength Normal and Lower Extremity Strength Normal.  Lymphatic Head & Neck  General Head & Neck Lymphatics: Bilateral - Description - Normal. Axillary  General Axillary Region: Bilateral - Description - Normal. Tenderness - Non Tender. Femoral & Inguinal  Generalized Femoral & Inguinal Lymphatics: Bilateral - Description - Normal. Tenderness - Non Tender.    Assessment & Plan  CANCER OF SIGMOID COLON (C18.7)  Your recent screening colonoscopy shows a small colon cancer in the mid to proximal sigmoid colon The tumor appears to be about 3 cm in diameter Your final pathology shows cancer in two node, and Dr. Marin Olp advises chemotherapy and port insertion. We will schedule port insertion. I have discussed the indications, techniques and risks of this procedure.   you will be referred to Dr. Burney Gauze postop  HYPERLIPIDEMIA, ACQUIRED (E78.5)    Edsel Petrin. Dalbert Batman, M.D., [facility] General and Minimally invasive Surgery Breast and Colorectal Surgery Office:   [phone] Pager:   [phone]

## 2017-06-11 ENCOUNTER — Ambulatory Visit (HOSPITAL_BASED_OUTPATIENT_CLINIC_OR_DEPARTMENT_OTHER): Payer: Medicare Other | Admitting: Anesthesiology

## 2017-06-11 ENCOUNTER — Ambulatory Visit (HOSPITAL_COMMUNITY): Payer: Medicare Other

## 2017-06-11 ENCOUNTER — Encounter (HOSPITAL_BASED_OUTPATIENT_CLINIC_OR_DEPARTMENT_OTHER)
Admission: RE | Disposition: A | Discharge status: Home / Self Care | Payer: Self-pay | Source: Ambulatory Visit | Attending: General Surgery

## 2017-06-11 ENCOUNTER — Other Ambulatory Visit: Payer: Self-pay

## 2017-06-11 ENCOUNTER — Ambulatory Visit (HOSPITAL_BASED_OUTPATIENT_CLINIC_OR_DEPARTMENT_OTHER)
Admission: RE | Admit: 2017-06-11 | Discharge: 2017-06-11 | Disposition: A | Discharge status: Home / Self Care | Payer: Medicare Other | Source: Ambulatory Visit | Attending: General Surgery | Admitting: General Surgery

## 2017-06-11 ENCOUNTER — Encounter (HOSPITAL_BASED_OUTPATIENT_CLINIC_OR_DEPARTMENT_OTHER): Payer: Self-pay | Admitting: Anesthesiology

## 2017-06-11 DIAGNOSIS — Z9049 Acquired absence of other specified parts of digestive tract: Secondary | ICD-10-CM | POA: Diagnosis not present

## 2017-06-11 DIAGNOSIS — Z419 Encounter for procedure for purposes other than remedying health state, unspecified: Secondary | ICD-10-CM

## 2017-06-11 DIAGNOSIS — Z95828 Presence of other vascular implants and grafts: Secondary | ICD-10-CM

## 2017-06-11 DIAGNOSIS — C187 Malignant neoplasm of sigmoid colon: Secondary | ICD-10-CM | POA: Diagnosis not present

## 2017-06-11 DIAGNOSIS — Z79899 Other long term (current) drug therapy: Secondary | ICD-10-CM | POA: Insufficient documentation

## 2017-06-11 DIAGNOSIS — Z452 Encounter for adjustment and management of vascular access device: Secondary | ICD-10-CM | POA: Diagnosis not present

## 2017-06-11 DIAGNOSIS — Z86010 Personal history of colonic polyps: Secondary | ICD-10-CM | POA: Insufficient documentation

## 2017-06-11 HISTORY — PX: PORTACATH PLACEMENT: SHX2246

## 2017-06-11 SURGERY — INSERTION, TUNNELED CENTRAL VENOUS DEVICE, WITH PORT
Anesthesia: General | Site: Chest | Laterality: Right

## 2017-06-11 MED ORDER — MIDAZOLAM HCL 2 MG/2ML IJ SOLN
1.0000 mg | INTRAMUSCULAR | Status: DC | PRN
  Administered 2017-06-11: 2 mg via INTRAVENOUS

## 2017-06-11 MED ORDER — FENTANYL CITRATE (PF) 100 MCG/2ML IJ SOLN
INTRAMUSCULAR | Status: AC
  Filled 2017-06-11: qty 2

## 2017-06-11 MED ORDER — ACETAMINOPHEN 500 MG PO TABS
ORAL_TABLET | ORAL | Status: AC
  Filled 2017-06-11: qty 2

## 2017-06-11 MED ORDER — GABAPENTIN 300 MG PO CAPS
300.0000 mg | ORAL_CAPSULE | ORAL | Status: AC
  Administered 2017-06-11: 300 mg via ORAL

## 2017-06-11 MED ORDER — CHLORHEXIDINE GLUCONATE CLOTH 2 % EX PADS: 6.0000 | MEDICATED_PAD | Freq: Once | CUTANEOUS | Status: DC

## 2017-06-11 MED ORDER — HEPARIN SOD (PORK) LOCK FLUSH 100 UNIT/ML IV SOLN
INTRAVENOUS | Status: DC | PRN
  Administered 2017-06-11: 500 [IU] via INTRAVENOUS

## 2017-06-11 MED ORDER — LACTATED RINGERS IV SOLN
INTRAVENOUS | Status: DC
  Administered 2017-06-11 (×2): via INTRAVENOUS

## 2017-06-11 MED ORDER — ONDANSETRON HCL 4 MG/2ML IJ SOLN: 4.0000 mg | Freq: Four times a day (QID) | INTRAMUSCULAR | Status: DC | PRN

## 2017-06-11 MED ORDER — OXYCODONE HCL 5 MG PO TABS: 5.0000 mg | ORAL_TABLET | Freq: Once | ORAL | Status: DC | PRN

## 2017-06-11 MED ORDER — DEXAMETHASONE SODIUM PHOSPHATE 4 MG/ML IJ SOLN
INTRAMUSCULAR | Status: DC | PRN
  Administered 2017-06-11: 10 mg via INTRAVENOUS

## 2017-06-11 MED ORDER — ACETAMINOPHEN 500 MG PO TABS
1000.0000 mg | ORAL_TABLET | ORAL | Status: AC
  Administered 2017-06-11: 1000 mg via ORAL

## 2017-06-11 MED ORDER — LIDOCAINE HCL (CARDIAC) 20 MG/ML IV SOLN
INTRAVENOUS | Status: DC | PRN
  Administered 2017-06-11: 60 mg via INTRAVENOUS

## 2017-06-11 MED ORDER — HEPARIN (PORCINE) IN NACL 2-0.9 UNIT/ML-% IJ SOLN
INTRAMUSCULAR | Status: AC | PRN
  Administered 2017-06-11: 1 via INTRAVENOUS

## 2017-06-11 MED ORDER — CEFAZOLIN SODIUM-DEXTROSE 2-4 GM/100ML-% IV SOLN
2.0000 g | INTRAVENOUS | Status: AC
  Administered 2017-06-11: 2 g via INTRAVENOUS

## 2017-06-11 MED ORDER — PROPOFOL 10 MG/ML IV BOLUS
INTRAVENOUS | Status: AC
  Filled 2017-06-11: qty 20

## 2017-06-11 MED ORDER — DEXAMETHASONE SODIUM PHOSPHATE 10 MG/ML IJ SOLN
INTRAMUSCULAR | Status: AC
  Filled 2017-06-11: qty 1

## 2017-06-11 MED ORDER — BUPIVACAINE-EPINEPHRINE (PF) 0.5% -1:200000 IJ SOLN
INTRAMUSCULAR | Status: DC | PRN
  Administered 2017-06-11: 9 mL

## 2017-06-11 MED ORDER — ONDANSETRON HCL 4 MG/2ML IJ SOLN
INTRAMUSCULAR | Status: AC
  Filled 2017-06-11: qty 2

## 2017-06-11 MED ORDER — CEFAZOLIN SODIUM-DEXTROSE 2-4 GM/100ML-% IV SOLN
INTRAVENOUS | Status: AC
  Filled 2017-06-11: qty 100

## 2017-06-11 MED ORDER — GABAPENTIN 300 MG PO CAPS
ORAL_CAPSULE | ORAL | Status: AC
  Filled 2017-06-11: qty 1

## 2017-06-11 MED ORDER — HEPARIN (PORCINE) IN NACL 2-0.9 UNIT/ML-% IJ SOLN
INTRAMUSCULAR | Status: AC
  Filled 2017-06-11: qty 500

## 2017-06-11 MED ORDER — PROPOFOL 10 MG/ML IV BOLUS
INTRAVENOUS | Status: DC | PRN
  Administered 2017-06-11: 100 mg via INTRAVENOUS

## 2017-06-11 MED ORDER — ONDANSETRON HCL 4 MG/2ML IJ SOLN
INTRAMUSCULAR | Status: DC | PRN
  Administered 2017-06-11: 4 mg via INTRAVENOUS

## 2017-06-11 MED ORDER — FENTANYL CITRATE (PF) 100 MCG/2ML IJ SOLN: 25.0000 ug | INTRAMUSCULAR | Status: DC | PRN

## 2017-06-11 MED ORDER — HEPARIN SOD (PORK) LOCK FLUSH 100 UNIT/ML IV SOLN
INTRAVENOUS | Status: AC
  Filled 2017-06-11: qty 5

## 2017-06-11 MED ORDER — MIDAZOLAM HCL 2 MG/2ML IJ SOLN
INTRAMUSCULAR | Status: AC
  Filled 2017-06-11: qty 2

## 2017-06-11 MED ORDER — FENTANYL CITRATE (PF) 100 MCG/2ML IJ SOLN
50.0000 ug | INTRAMUSCULAR | Status: DC | PRN
  Administered 2017-06-11: 50 ug via INTRAVENOUS

## 2017-06-11 MED ORDER — SCOPOLAMINE 1 MG/3DAYS TD PT72: 1.0000 | MEDICATED_PATCH | Freq: Once | TRANSDERMAL | Status: DC | PRN

## 2017-06-11 MED ORDER — EPHEDRINE SULFATE 50 MG/ML IJ SOLN
INTRAMUSCULAR | Status: DC | PRN
  Administered 2017-06-11: 15 mg via INTRAVENOUS

## 2017-06-11 MED ORDER — OXYCODONE HCL 5 MG/5ML PO SOLN: 5.0000 mg | Freq: Once | ORAL | Status: DC | PRN

## 2017-06-11 MED ORDER — LIDOCAINE 2% (20 MG/ML) 5 ML SYRINGE
INTRAMUSCULAR | Status: AC
  Filled 2017-06-11: qty 5

## 2017-06-11 SURGICAL SUPPLY — 56 items
ADH SKN CLS APL DERMABOND .7 (GAUZE/BANDAGES/DRESSINGS) ×1
APL SKNCLS STERI-STRIP NONHPOA (GAUZE/BANDAGES/DRESSINGS)
BAG DECANTER FOR FLEXI CONT (MISCELLANEOUS) ×2 IMPLANT
BENZOIN TINCTURE PRP APPL 2/3 (GAUZE/BANDAGES/DRESSINGS) IMPLANT
BLADE HEX COATED 2.75 (ELECTRODE) ×2 IMPLANT
BLADE SURG 15 STRL LF DISP TIS (BLADE) ×1 IMPLANT
BLADE SURG 15 STRL SS (BLADE) ×2
CANISTER SUCT 1200ML W/VALVE (MISCELLANEOUS) IMPLANT
CHLORAPREP W/TINT 26ML (MISCELLANEOUS) ×3 IMPLANT
COVER BACK TABLE 60X90IN (DRAPES) ×2 IMPLANT
COVER MAYO STAND STRL (DRAPES) ×2 IMPLANT
COVER PROBE 5X48 (MISCELLANEOUS) ×2
DECANTER SPIKE VIAL GLASS SM (MISCELLANEOUS) IMPLANT
DERMABOND ADVANCED (GAUZE/BANDAGES/DRESSINGS) ×1
DERMABOND ADVANCED .7 DNX12 (GAUZE/BANDAGES/DRESSINGS) ×1 IMPLANT
DRAPE C-ARM 42X72 X-RAY (DRAPES) ×2 IMPLANT
DRAPE LAPAROSCOPIC ABDOMINAL (DRAPES) ×2 IMPLANT
DRAPE UTILITY XL STRL (DRAPES) ×2 IMPLANT
DRSG TEGADERM 2-3/8X2-3/4 SM (GAUZE/BANDAGES/DRESSINGS) IMPLANT
DRSG TEGADERM 4X10 (GAUZE/BANDAGES/DRESSINGS) IMPLANT
DRSG TEGADERM 4X4.75 (GAUZE/BANDAGES/DRESSINGS) IMPLANT
ELECT REM PT RETURN 9FT ADLT (ELECTROSURGICAL) ×2
ELECTRODE REM PT RTRN 9FT ADLT (ELECTROSURGICAL) ×1 IMPLANT
GAUZE SPONGE 4X4 12PLY STRL LF (GAUZE/BANDAGES/DRESSINGS) IMPLANT
GLOVE BIOGEL PI IND STRL 7.0 (GLOVE) IMPLANT
GLOVE BIOGEL PI INDICATOR 7.0 (GLOVE) ×2
GLOVE ECLIPSE 6.5 STRL STRAW (GLOVE) ×1 IMPLANT
GLOVE EUDERMIC 7 POWDERFREE (GLOVE) ×2 IMPLANT
GOWN STRL REUS W/ TWL LRG LVL3 (GOWN DISPOSABLE) ×1 IMPLANT
GOWN STRL REUS W/ TWL XL LVL3 (GOWN DISPOSABLE) ×1 IMPLANT
GOWN STRL REUS W/TWL LRG LVL3 (GOWN DISPOSABLE) ×2
GOWN STRL REUS W/TWL XL LVL3 (GOWN DISPOSABLE) ×2
IV CATH PLACEMENT UNIT 16 GA (IV SOLUTION) IMPLANT
IV KIT MINILOC 20X1 SAFETY (NEEDLE) IMPLANT
KIT CVR 48X5XPRB PLUP LF (MISCELLANEOUS) ×1 IMPLANT
KIT PORT POWER 8FR ISP CVUE (Miscellaneous) ×2 IMPLANT
NDL BLUNT 17GA (NEEDLE) IMPLANT
NDL HYPO 25X1 1.5 SAFETY (NEEDLE) ×1 IMPLANT
NEEDLE BLUNT 17GA (NEEDLE) IMPLANT
NEEDLE HYPO 22GX1.5 SAFETY (NEEDLE) ×1 IMPLANT
NEEDLE HYPO 25X1 1.5 SAFETY (NEEDLE) ×2 IMPLANT
PACK BASIN DAY SURGERY FS (CUSTOM PROCEDURE TRAY) ×2 IMPLANT
PENCIL BUTTON HOLSTER BLD 10FT (ELECTRODE) ×2 IMPLANT
SET SHEATH INTRODUCER 10FR (MISCELLANEOUS) IMPLANT
SHEATH COOK PEEL AWAY SET 9F (SHEATH) IMPLANT
SLEEVE SCD COMPRESS KNEE MED (MISCELLANEOUS) ×2 IMPLANT
STRIP CLOSURE SKIN 1/2X4 (GAUZE/BANDAGES/DRESSINGS) IMPLANT
SUT MNCRL AB 4-0 PS2 18 (SUTURE) ×2 IMPLANT
SUT PROLENE 2 0 CT2 30 (SUTURE) ×2 IMPLANT
SUT VICRYL 3-0 CR8 SH (SUTURE) ×2 IMPLANT
SYR 10ML LL (SYRINGE) ×2 IMPLANT
SYR 5ML LUER SLIP (SYRINGE) ×2 IMPLANT
TOWEL OR 17X24 6PK STRL BLUE (TOWEL DISPOSABLE) ×3 IMPLANT
TOWEL OR NON WOVEN STRL DISP B (DISPOSABLE) ×2 IMPLANT
TUBE CONNECTING 20X1/4 (TUBING) IMPLANT
YANKAUER SUCT BULB TIP NO VENT (SUCTIONS) IMPLANT

## 2017-06-11 NOTE — Discharge Summary (Signed)
Patient ID: Jack Gray [id number] 71 y.o. 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 Jack Gray is his PCP .   Patient states he's had some colon polyps in the past. Recent colonoscopy 3 years after his last was done electively. No symptoms. It shows a non-circumferential ulcerated mass at 40 cm mid to proximal sigmoid. 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 MSI stable. Lynch syndrome felt to be unlikely. CEA 2.3. Creatinine 1.41. LFTs normal. CT scan shows a 3 cm mass in the proximal sigmoid. There are some borderline mesenteric nodes. The CT said there was a 2 cm rounded mass in the second portion of the duodenum Upper endoscopy was done earlier this week and shows a little bit of gastritis but there was no duodenal mass whatsoever. Past history is significant only for hyperlipidemia and colon polyps. He is healthy. Family history is negative for colon cancer or breast cancer. One sister died of melanoma possibly metastatic to the lungs. One sister living several years after head and neck cancer stage I He is scheduled for laparoscopic-assisted left colectomy, possible splenic flexure takedown.  He underwent a bowel prep at home  and is brought to the operating room electively    Hospital Course: On the day of admission the patient was taken to the operating room and underwent laparoscopic-assisted left colectomy, takedown  splenic flexure, and proctoscopy.   Operative findings were:     There was a palpable mass in the proximal sigmoid colon.  There is no gross adenopathy.  There is no evidence of metastatic disease.  He had some fatty omental adhesions in the left upper quadrant tethering the transverse colon to the abdominal wall and these were chronic.  The sigmoid colon was tethered to the left lower quadrant because of some chronic adhesions.  We were able to do a high ligation of the inferior mesenteric artery and inferior mesenteric vein.  We took down the splenic flexure.  The proximal margin appeared to be 7 or 8 cm and the distal margin was probably 20 cm.  The anastomosis was handsewn single layer 2-0 silk.  Proctoscopy at the end  of the case showed no air leak.    Postoperatively the patient did well.  He progressed in his diet and activities.  Foley catheter removed on postop day 1 without trouble voiding.  He began having small stools on postop day 2.  Advance diet to regular diet and was ambulating independently and ready to go home on postop day 4.  At the time of discharge his lungs were clear.  His abdomen was soft with minimal tenderness.  Incision looked good.  He was given instructions in diet and activities.  He was given a prescription for analgesic.  Follow-up arrangements were made.     Final pathology shows a 4 cm invasive adenocarcinoma and 2 out of 14 mesenteric lymph nodes were replaced by tumor making this a stage T3, N1c.  The patient is referred to Jack Gray, medical oncology, who tentatively plans adjuvant chemotherapy.     Consults:  None  Significant Diagnostic Studies: Surgical pathology   Treatments: surgery: Laparoscopic-assisted left colectomy with takedown splenic flexure and rigid proctoscopy   Disposition: Home  Patient Instructions:  Allergies as of 06/03/2017   No Known Allergies     Medication List    TAKE these medications   atorvastatin 20 MG  tablet Commonly known as:  LIPITOR Take 20 mg by mouth daily at 6 PM.   GLUCOSAMINE 1500 COMPLEX PO Take 1,500 mg by mouth 2 (two) times daily.   multivitamin with minerals Tabs tablet Take 1 tablet by mouth daily.   PRESERVISION AREDS PO Take 1 tablet by mouth 2 (two) times daily.   Vitamin D 2000 units Caps Take 2,000 Units by mouth daily.     ASK your doctor about these medications   Abdominal Binder/Elastic Med Misc 1 Device by Does not apply route once. Ask about: Should I take this medication?       Activity: Frequent ambulation.  No sports or heavy lifting for 5 weeks Diet: Initially soft diet.  Transition to high-fiber low-fat diet in 2-4 weeks Wound Care: As directed.  Okay to shower.  Keep wound clean and dry otherwise.  Follow-up:  With Dr. Dalbert Gray in 1 week.  Signed: Edsel Gray. Jack Gray, M.D., FACS General and minimally invasive surgery Breast and Colorectal Surgery   I logged  onto the [facility] website and reviewed his prescription medication history   06/11/2017, 6:34 AM

## 2017-06-11 NOTE — Transfer of Care (Signed)
Immediate Anesthesia Transfer of Care Note  Patient: Jack Gray  Procedure(s) Performed: INSERTION PORT-A-CATH (Right Chest)  Patient Location: PACU  Anesthesia Type:General  Level of Consciousness: awake, sedated and patient cooperative  Airway & Oxygen Therapy: Patient Spontanous Breathing and Patient connected to face mask oxygen  Post-op Assessment: Report given to RN and Post -op Vital signs reviewed and stable  Post vital signs: Reviewed and stable  Last Vitals:  Vitals:   06/11/17 0836 06/11/17 0947  BP: 133/84 116/63  Pulse: 73 79  Resp: 18 15  Temp: 36.8 C 36.7 C  SpO2: 100% 100%    Last Pain:  Vitals:   06/11/17 0836  TempSrc: Oral         Complications: No apparent anesthesia complications

## 2017-06-11 NOTE — Anesthesia Procedure Notes (Addendum)
Procedure Name: LMA Insertion Date/Time: 06/11/2017 9:03 AM Performed by: Lyndee Leo, CRNA Pre-anesthesia Checklist: Patient identified, Emergency Drugs available, Suction available and Patient being monitored Patient Re-evaluated:Patient Re-evaluated prior to induction Oxygen Delivery Method: Circle system utilized Preoxygenation: Pre-oxygenation with 100% oxygen Induction Type: IV induction Ventilation: Mask ventilation without difficulty LMA: LMA inserted LMA Size: 4.0 Number of attempts: 1 Airway Equipment and Method: Bite block Placement Confirmation: positive ETCO2 Tube secured with: Tape Dental Injury: Teeth and Oropharynx as per pre-operative assessment

## 2017-06-11 NOTE — Interval H&P Note (Signed)
History and Physical Interval Note:  06/11/2017 8:26 AM  [name]  has presented today for surgery, with the diagnosis of cancer sigmoid colon  The various methods of treatment have been discussed with the patient and family. After consideration of risks, benefits and other options for treatment, the patient has consented to  Procedure(s): [name] (N/A) as a surgical intervention .  The patient's history has been reviewed, patient examined, no change in status, stable for surgery.  I have reviewed the patient's chart and labs.  Questions were answered to the patient's satisfaction.     Jack Gray

## 2017-06-11 NOTE — Op Note (Signed)
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 Dalbert Batman, M.D., FACS  Assistant:                      OR staff   Operative Indications:    This is a very pleasant and very healthy 71 year old gentleman, referred by Dr. 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 Dr. Jacelyn Grip is his PCP. Dr. Burney Gauze is their medical oncologist    Patient states he's had some colon polyps in the past. Recent colonoscopy shows a non-circumferential ulcerated mass at 40 cm mid to proximal sigmoid. 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 MSI stable. Lynch syndrome felt to be unlikely. CEA 2.3. Creatinine 1.41. LFTs normal. CT scan shows a 3 cm mass in the proximal sigmoid. There are some borderline mesenteric nodes. The CT said there was a 2 cm rounded mass in the second portion of the duodenum Upper endoscopy was done  and shows a little bit of gastritis but there was no duodenal mass whatsoever.       On Oct. 24, 2018 he underwent lap assisted Left colectomy. He is recovering uneventfully. Pathology shows 2/14 nodes replaced by tumor.  Stage T3, N1c.  Dr. Marin Olp requests port placement to facilitate adjuvant chemotherapy. He is brought to there operating room electively for port placement.  Operative Findings:       The port was placed through the right subclavian vein without difficulty.  Fluoroscopic imaging in the operating room shows the port and catheter to be well positioned in this.  Vena cava without any deformity.  The port and catheter had excellent blood return and flushed easily.  Procedure in  Detail:          Following the induction of general LMA anesthesia the patient was positioned with a small roll behind his shoulders and his arms tucked at his side.  The neck and chest were prepped and draped in a sterile fashion.  Surgical timeout was performed.  Intravenous antibiotic were given.  0.5% Marcaine with epinephrine was used as local infiltration anesthetic.     A right subclavian venipuncture was performed with blood return on the first pass.  Guidewire was inserted without difficulty and positioned in the superior vena cava under fluoroscopic guidance.  Using the C-arm I marked a template on the chest wall to guide positioning of the catheter tip and judgment of length of the catheter so it would be in the superior vena cava near the right atrium.  A small incision was made at the wire insertion site.  A second, transverse incision was made below the mid clavicle.  A subcutaneous pocket was created.  Using a tunneling device I passed the catheter from the wire insertion site to the port pocket site.  Using the template on the chest wall I cut the catheter 20 cm in length.  The catheter was secured to the port with the locking device and everything was flushed with heparinized saline.  The port was sutured to the pectoralis fascia with 3 interrupted sutures of 2-0 Prolene.  The dilator and peel-away sheath were inserted over the guidewire into the central venous circulation without difficulty.  The wire and dilator were removed, the catheter threaded and the peel-away sheath removed.  The port flushed easily and had excellent blood return.  Fluoroscopy confirmed the catheter tip to be in the superior vena cava near the right atrial junction and there was no deformity of the catheter anywhere along its course.  The port and catheter were, flushed with concentrated heparin.     Hemostasis was excellent.  Subcutaneous tissue was closed with 3-0 Vicryl sutures and the skin incisions closed with  subcuticular 4-0 Monocryl and Dermabond.  The patient tolerated the procedure well was taken to PACU in stable condition.  A chest x-Arman is planned.  EBL 10 mL.  Counts correct.  Complications none.     Edsel Petrin. Dalbert Batman, M.D., FACS General and Minimally Invasive Surgery Breast and Colorectal Surgery    Addendum: I logged onto the [facility] website and reviewed his prescription medication history.   06/11/2017 9:47 AM

## 2017-06-11 NOTE — Discharge Instructions (Signed)
PORT-A-CATH: POST OP INSTRUCTIONS  Always review your discharge instruction sheet given to you by the facility where your surgery was performed.   1. A prescription for pain medication may be given to you upon discharge. Take your pain medication as prescribed, if needed. If narcotic pain medicine is not needed, then you make take acetaminophen (Tylenol) or ibuprofen (Advil) as needed.  2. Take your usually prescribed medications unless otherwise directed. 3. If you need a refill on your pain medication, please contact our office. All narcotic pain medicine now requires a paper prescription.  Phoned in and fax refills are no longer allowed by law.  Prescriptions will not be filled after 5 pm or on weekends.  4. You should follow a light diet for the remainder of the day after your procedure. 5. Most patients will experience some mild swelling and/or bruising in the area of the incision. It may take several days to resolve. 6. It is common to experience some constipation if taking pain medication after surgery. Increasing fluid intake and taking a stool softener (such as Colace) will usually help or prevent this problem from occurring. A mild laxative (Milk of Magnesia or Miralax) should be taken according to package directions if there are no bowel movements after 48 hours.  7. Unless discharge instructions indicate otherwise, you may remove your bandages 48 hours after surgery, and you may shower at that time. You may have steri-strips (small white skin tapes) in place directly over the incision.  These strips should be left on the skin for 7-10 days.  If your surgeon used Dermabond (skin glue) on the incision, you may shower in 24 hours.  The glue will flake off over the next 2-3 weeks.  8. If your port is left accessed at the end of surgery (needle left in port), the dressing cannot get wet and should only by changed by a healthcare professional. When the port is no longer accessed (when the  needle has been removed), follow step 7.   9. ACTIVITIES:  Limit activity involving your arms for the next 72 hours. Do no strenuous exercise or activity for 1 week. You may drive when you are no longer taking prescription pain medication, you can comfortably wear a seatbelt, and you can maneuver your car. 10.You may need to see your doctor in the office for a follow-up appointment.  Please       check with your doctor.  11.When you receive a new Port-a-Cath, you will get a product guide and        ID card.  Please keep them in case you need them.  WHEN TO CALL YOUR DOCTOR [phone]): 1. Fever over 101.0 2. Chills 3. Continued bleeding from incision 4. Increased redness and tenderness at the site 5. Shortness of breath, difficulty breathing   The clinic staff is available to answer your questions during regular business hours. Please dont hesitate to call and ask to speak to one of the nurses or medical assistants for clinical concerns. If you have a medical emergency, go to the nearest emergency room or call 911.  A surgeon from [facility] is always on call at the hospital.     For further information, please visit [email or web address]   Post Anesthesia Home Care Instructions  Activity: Get plenty of rest for the remainder of the day. A responsible individual must stay with you for 24 hours following the procedure.  For the next 24 hours, DO NOT: -Drive a car -  Operate machinery -Drink alcoholic beverages -Take any medication unless instructed by your physician -Make any legal decisions or sign important papers.  Meals: Start with liquid foods such as gelatin or soup. Progress to regular foods as tolerated. Avoid greasy, spicy, heavy foods. If nausea and/or vomiting occur, drink only clear liquids until the nausea and/or vomiting subsides. Call your physician if vomiting continues.  Special Instructions/Symptoms: Your throat may feel dry or sore from  the anesthesia or the breathing tube placed in your throat during surgery. If this causes discomfort, gargle with warm salt water. The discomfort should disappear within 24 hours.  If you had a scopolamine patch placed behind your ear for the management of post- operative nausea and/or vomiting:  1. The medication in the patch is effective for 72 hours, after which it should be removed.  Wrap patch in a tissue and discard in the trash. Wash hands thoroughly with soap and water. 2. You may remove the patch earlier than 72 hours if you experience unpleasant side effects which may include dry mouth, dizziness or visual disturbances. 3. Avoid touching the patch. Wash your hands with soap and water after contact with the patch.

## 2017-06-11 NOTE — Anesthesia Preprocedure Evaluation (Signed)
Anesthesia Evaluation  Patient identified by MRN, date of birth, ID band Patient awake    Reviewed: Allergy & Precautions, H&P , NPO status , Patient's Chart, lab work & pertinent test results  Airway Mallampati: II   Neck ROM: full    Dental   Pulmonary neg pulmonary ROS,    breath sounds clear to auscultation       Cardiovascular negative cardio ROS   Rhythm:regular Rate:Normal     Neuro/Psych    GI/Hepatic H/o colon CA   Endo/Other    Renal/GU stones     Musculoskeletal   Abdominal   Peds  Hematology   Anesthesia Other Findings   Reproductive/Obstetrics                             Anesthesia Physical Anesthesia Plan  ASA: II  Anesthesia Plan: General   Post-op Pain Management:    Induction: Intravenous  PONV Risk Score and Plan: 2 and Ondansetron, Dexamethasone and Treatment may vary due to age or medical condition  Airway Management Planned: LMA  Additional Equipment:   Intra-op Plan:   Post-operative Plan:   Informed Consent: I have reviewed the patients History and Physical, chart, labs and discussed the procedure including the risks, benefits and alternatives for the proposed anesthesia with the patient or authorized representative who has indicated his/her understanding and acceptance.     Plan Discussed with: CRNA, Anesthesiologist and Surgeon  Anesthesia Plan Comments:         Anesthesia Quick Evaluation

## 2017-06-11 NOTE — Anesthesia Postprocedure Evaluation (Signed)
Anesthesia Post Note  Patient: Jack Gray  Procedure(s) Performed: INSERTION PORT-A-CATH (Right Chest)     Patient location during evaluation: PACU Anesthesia Type: General Level of consciousness: awake and alert Pain management: pain level controlled Vital Signs Assessment: post-procedure vital signs reviewed and stable Respiratory status: spontaneous breathing, nonlabored ventilation, respiratory function stable and patient connected to nasal cannula oxygen Cardiovascular status: blood pressure returned to baseline and stable Postop Assessment: no apparent nausea or vomiting Anesthetic complications: no    Last Vitals:  Vitals:   06/11/17 1100 06/11/17 1129  BP: 119/70 123/72  Pulse: 69 67  Resp: (!) 25 18  Temp:  36.7 C  SpO2: 98% 99%    Last Pain:  Vitals:   06/11/17 1015  TempSrc:   PainSc: 0-No pain                 [name]

## 2017-06-12 ENCOUNTER — Encounter (HOSPITAL_BASED_OUTPATIENT_CLINIC_OR_DEPARTMENT_OTHER): Payer: Self-pay | Admitting: General Surgery

## 2017-06-14 DIAGNOSIS — K2970 Gastritis, unspecified, without bleeding: Secondary | ICD-10-CM | POA: Diagnosis not present

## 2017-06-14 DIAGNOSIS — Z86010 Personal history of colonic polyps: Secondary | ICD-10-CM | POA: Diagnosis not present

## 2017-06-14 DIAGNOSIS — C187 Malignant neoplasm of sigmoid colon: Secondary | ICD-10-CM | POA: Diagnosis not present

## 2017-06-18 ENCOUNTER — Encounter: Payer: Self-pay | Admitting: Hematology & Oncology

## 2017-06-18 ENCOUNTER — Other Ambulatory Visit: Payer: Self-pay

## 2017-06-18 ENCOUNTER — Other Ambulatory Visit (HOSPITAL_BASED_OUTPATIENT_CLINIC_OR_DEPARTMENT_OTHER): Payer: Medicare Other

## 2017-06-18 ENCOUNTER — Ambulatory Visit (HOSPITAL_BASED_OUTPATIENT_CLINIC_OR_DEPARTMENT_OTHER): Payer: Medicare Other | Admitting: Hematology & Oncology

## 2017-06-18 VITALS — BP 140/79 | HR 78 | Temp 97.8°F | Resp 16 | Ht 68.0 in | Wt 162.0 lb

## 2017-06-18 DIAGNOSIS — C187 Malignant neoplasm of sigmoid colon: Secondary | ICD-10-CM

## 2017-06-18 DIAGNOSIS — C779 Secondary and unspecified malignant neoplasm of lymph node, unspecified: Secondary | ICD-10-CM

## 2017-06-18 DIAGNOSIS — Z86010 Personal history of colonic polyps: Secondary | ICD-10-CM | POA: Diagnosis not present

## 2017-06-18 DIAGNOSIS — Z808 Family history of malignant neoplasm of other organs or systems: Secondary | ICD-10-CM | POA: Diagnosis not present

## 2017-06-18 DIAGNOSIS — Z7189 Other specified counseling: Secondary | ICD-10-CM

## 2017-06-18 HISTORY — DX: Other specified counseling: Z71.89

## 2017-06-18 LAB — CBC WITH DIFFERENTIAL (CANCER CENTER ONLY)
BASO#: 0 10*3/uL (ref 0.0–0.2)
BASO%: 0.4 % (ref 0.0–2.0)
EOS%: 5.5 % (ref 0.0–7.0)
Eosinophils Absolute: 0.4 10*3/uL (ref 0.0–0.5)
HCT: 36.5 % — ABNORMAL LOW (ref 38.7–49.9)
HGB: 12.3 g/dL — ABNORMAL LOW (ref 13.0–17.1)
LYMPH#: 1.8 10*3/uL (ref 0.9–3.3)
LYMPH%: 23.6 % (ref 14.0–48.0)
MCH: 30.3 pg (ref 28.0–33.4)
MCHC: 33.7 g/dL (ref 32.0–35.9)
MCV: 90 fL (ref 82–98)
MONO#: 0.9 10*3/uL (ref 0.1–0.9)
MONO%: 11.5 % (ref 0.0–13.0)
NEUT#: 4.5 10*3/uL (ref 1.5–6.5)
NEUT%: 59 % (ref 40.0–80.0)
PLATELETS: 235 10*3/uL (ref 145–400)
RBC: 4.06 10*6/uL — AB (ref 4.20–5.70)
RDW: 12.6 % (ref 11.1–15.7)
WBC: 7.6 10*3/uL (ref 4.0–10.0)

## 2017-06-18 LAB — CMP (CANCER CENTER ONLY)
ALBUMIN: 3.6 g/dL (ref 3.3–5.5)
ALK PHOS: 81 U/L (ref 26–84)
ALT: 28 U/L (ref 10–47)
AST: 24 U/L (ref 11–38)
BUN: 16 mg/dL (ref 7–22)
CO2: 30 mEq/L (ref 18–33)
CREATININE: 1.3 mg/dL — AB (ref 0.6–1.2)
Calcium: 9.2 mg/dL (ref 8.0–10.3)
Chloride: 103 mEq/L (ref 98–108)
Glucose, Bld: 121 mg/dL — ABNORMAL HIGH (ref 73–118)
Potassium: 4.2 mEq/L (ref 3.3–4.7)
SODIUM: 148 meq/L — AB (ref 128–145)
TOTAL PROTEIN: 6.7 g/dL (ref 6.4–8.1)
Total Bilirubin: 0.7 mg/dl (ref 0.20–1.60)

## 2017-06-18 MED ORDER — LIDOCAINE-PRILOCAINE 2.5-2.5 % EX CREA
TOPICAL_CREAM | CUTANEOUS | 4 refills | Status: DC
Start: 2017-06-18 — End: 2017-06-19

## 2017-06-18 NOTE — Progress Notes (Signed)
START ON PATHWAY REGIMEN - Colorectal     A cycle is every 14 days:     Oxaliplatin      Leucovorin      5-Fluorouracil      5-Fluorouracil   **Always confirm dose/schedule in your pharmacy ordering system**    Patient Characteristics: Colon Adjuvant, Stage III, Low Risk (T1-3, N1) Current evidence of distant metastases<= No AJCC T Category: T3 AJCC N Category: N1c AJCC M Category: M0 AJCC 8 Stage Grouping: IIIB Intent of Therapy: Curative Intent, Discussed with Patient

## 2017-06-18 NOTE — Progress Notes (Signed)
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 New Patient (Initial Visit)  : I had colon cancer taken out.  HPI: Jack Gray is a very nice 71 year old white male.  I actually know him well.  He is the father-in-law of 1 of our former patients.  He has been in great health.  He has been working.  He really has had no medical problems.  He has had polyps.  He gets a colonoscopy every 3 years.  He had no weight loss or weight gain.  There is no change in bowel or bladder habits.  He had no bleeding.  He had no fatigue or weakness.  His only complaint was him epi gastric discomfort.  He did have a CT scan done.  This was ordered by his gastroenterologist.  The CT scan was done in late September.  This showed a soft tissue density in the duodenum.  This was suspicious for a duodenal polyp.  He had a 3 cm mass in the proximal sigmoid colon.  He had some adjacent peri-colonic lymph nodes measuring up to 10 mm.  There is some colonic diverticulosis.  He did have a upper and lower endoscopy prior to the CT scan.  The upper endoscopy showed Helicobacter infection.  The colonoscopy did show the colon cancer.  This was biopsied.  I do not have the pathology report from gastroenterology but I have to believe that this was colon cancer.  He was then referred to Jack Gray.  Jack Gray took him to surgery on October 24.  His preop CEA was 2.3.  He underwent a partial colectomy.  The pathology report ([id number]) showed invasive adenocarcinoma.  This was mucinous features.  It was moderately differentiated.  It invaded into the pericolonic tissue.  All margins are negative.  There were felt to be 16 lymph nodes.  2 were positive for mucinous tumor.  The MMR and MSI were both normal.  No K-ras, N-ras or BRAF have been run yet.  We will have to call pathology for this.  He has recovered quite nicely.  He was out of bed  the same day as surgery.  He is eating well.  He is having some loose stools.  He is having no bleeding.  He has no history of colon cancer in the family.  A older sister had metastatic melanoma.  He does not smoke.  He does not drink.  Overall, I would say performance status is ECOG 0.    Past Medical History:  Diagnosis Date  . Anemia    as a child  . Cancer of sigmoid colon ([place]) 05/30/2017  . History of kidney stones   . Pneumonia    as a child  :  Past Surgical History:  Procedure Laterality Date  . cyctoscopy     for stones  . HERNIA REPAIR     r and left inguinal  30 years ago  . SHOULDER SURGERY     Bil  :   Current Outpatient Medications:  .  atorvastatin (LIPITOR) 20 MG tablet, Take 20 mg by mouth daily at 6 PM., Disp: , Rfl:  .  Cholecalciferol (VITAMIN D) 2000 units CAPS, Take 2,000 Units by mouth daily., Disp: , Rfl:  .  Glucosamine-Chondroit-Vit C-Mn (GLUCOSAMINE 1500 COMPLEX PO), Take 1,500 mg by mouth 2 (two) times daily., Disp: , Rfl:  .  Multiple Vitamin (MULTIVITAMIN WITH MINERALS) TABS  tablet, Take 1 tablet by mouth daily., Disp: , Rfl:  .  Multiple Vitamins-Minerals (PRESERVISION AREDS PO), Take 1 tablet by mouth 2 (two) times daily., Disp: , Rfl: :  :  No Known Allergies:  History reviewed. No pertinent family history.:  Social History   Socioeconomic History  . Marital status: Married    Spouse name: Not on file  . Number of children: Not on file  . Years of education: Not on file  . Highest education level: Not on file  Social Needs  . Financial resource strain: Not on file  . Food insecurity - worry: Not on file  . Food insecurity - inability: Not on file  . Transportation needs - medical: Not on file  . Transportation needs - non-medical: Not on file  Occupational History  . Not on file  Tobacco Use  . Smoking status: Never Smoker  . Smokeless tobacco: Never Used  Substance and Sexual Activity  . Alcohol use: No  . Drug use:  No  . Sexual activity: Yes  Other Topics Concern  . Not on file  Social History Narrative  . Not on file  :  Pertinent items are noted in HPI.  Exam: Well-developed and well-nourished white male in no obvious distress.  Vital signs show a temperature of 97.8.  Pulse 78.  Blood pressure 140/79.  Weight is 162 pounds.  Head and neck exam shows no ocular or oral lesions.  There are no palpable cervical or supraclavicular lymph nodes.  Lungs are clear bilaterally.  Cardiac exam regular rate and rhythm with no murmurs, rubs or bruits.  Abdomen is soft.  He has well-healed laparoscopy scars.  He has no tenderness.  There is no fluid wave.  There is no palpable liver or spleen tip.  Back exam shows no tenderness over the spine, ribs or hips.  Extremities shows no clubbing, cyanosis or edema.  Has good range of motion of his joints.  Neurological exam shows no focal neurological deficits.  Skin exam shows no rashes, ecchymoses or petechia.  @IPVITALS @   Recent Labs    06/18/17 1507  WBC 7.6  HGB 12.3*  HCT 36.5*  PLT 235   Recent Labs    06/18/17 1507  NA 148*  K 4.2  CL 103  CO2 30  GLUCOSE 121*  BUN 16  CREATININE 1.3*  CALCIUM 9.2    Blood smear review: None  Pathology: See above    Assessment and Plan: Jack Gray is a 71 year old gentleman with a stage IIIB adenocarcinoma of the sigmoid colon.  This was resected.  He had 2+ lymph nodes.  His tumor was mucinous.  There was no perforation.  There is no obstruction.  I feel that he has a more favorable prognosis with stage III cancer.  As such, I do not think that he needs a full 6 months of chemotherapy in the adjuvant setting.  I talked to he and his family for about an hour.  He already has a Port-A-Cath placed.  I think he would be a very good candidate for FOLFOX chemotherapy.  I explained to him how FOLFOX chemotherapy works.  Given that his son-in-law also had colon cancer.  He is well aware of chemotherapy and the side  effects.  He is in very good shape.  He would like to get started quickly.  I hopefully will be able to get started in 2 days, on November 14.  His surgery was about 4 weeks ago.  We really need to get started on treatment within a month for a better outcome.  I explained the side effects of treatment.  I explained about the neuropathy with oxaliplatin.  I explained about the palato-pharyngeal paresthesia with the oxaliplatin.  I explained about the potential rash with 5-FU.  I explained about the possible low blood counts.  There may be mouth sores.  He may have diarrhea.  Overall, I think that he will be able to get through treatment without much difficulty.  I think that 8 cycles would be appropriate for him.  Answered all their questions.  I reviewed the pathology report with them.  We will plan to get started on Wednesday.  We will see him back in 2 weeks for second cycle of treatment.  Also

## 2017-06-19 ENCOUNTER — Other Ambulatory Visit: Payer: Self-pay | Admitting: *Deleted

## 2017-06-19 ENCOUNTER — Telehealth: Payer: Self-pay | Admitting: *Deleted

## 2017-06-19 LAB — CEA (IN HOUSE-CHCC): CEA (CHCC-In House): 1.61 ng/mL (ref 0.00–5.00)

## 2017-06-19 LAB — IRON AND TIBC
%SAT: 23 % (ref 20–55)
IRON: 59 ug/dL (ref 42–163)
TIBC: 253 ug/dL (ref 202–409)
UIBC: 193 ug/dL (ref 117–376)

## 2017-06-19 LAB — FERRITIN: Ferritin: 354 ng/ml — ABNORMAL HIGH (ref 22–316)

## 2017-06-19 LAB — LACTATE DEHYDROGENASE: LDH: 202 U/L (ref 125–245)

## 2017-06-19 MED ORDER — LIDOCAINE-PRILOCAINE 2.5-2.5 % EX CREA: TOPICAL_CREAM | CUTANEOUS | 4 refills | Status: DC

## 2017-06-19 NOTE — Telephone Encounter (Signed)
Spoke with Maudie Mercury at [facility] to add on the following tests to specimen [phone]  BRAF KRAS NRAF  Maudie Mercury stated that BRAF and KRAS were already in progress. She will add NRAF to request.

## 2017-06-20 ENCOUNTER — Ambulatory Visit (HOSPITAL_BASED_OUTPATIENT_CLINIC_OR_DEPARTMENT_OTHER): Payer: Medicare Other

## 2017-06-20 ENCOUNTER — Other Ambulatory Visit: Payer: Self-pay

## 2017-06-20 ENCOUNTER — Other Ambulatory Visit (HOSPITAL_BASED_OUTPATIENT_CLINIC_OR_DEPARTMENT_OTHER): Payer: Medicare Other

## 2017-06-20 VITALS — BP 123/72 | HR 82 | Temp 98.2°F | Resp 20 | Wt 158.1 lb

## 2017-06-20 DIAGNOSIS — Z5111 Encounter for antineoplastic chemotherapy: Secondary | ICD-10-CM

## 2017-06-20 DIAGNOSIS — C779 Secondary and unspecified malignant neoplasm of lymph node, unspecified: Secondary | ICD-10-CM

## 2017-06-20 DIAGNOSIS — C187 Malignant neoplasm of sigmoid colon: Secondary | ICD-10-CM

## 2017-06-20 LAB — CBC WITH DIFFERENTIAL (CANCER CENTER ONLY)
BASO#: 0 10*3/uL (ref 0.0–0.2)
BASO%: 0.5 % (ref 0.0–2.0)
EOS ABS: 0.3 10*3/uL (ref 0.0–0.5)
EOS%: 4.7 % (ref 0.0–7.0)
HEMATOCRIT: 34.3 % — AB (ref 38.7–49.9)
HGB: 11.8 g/dL — ABNORMAL LOW (ref 13.0–17.1)
LYMPH#: 1.3 10*3/uL (ref 0.9–3.3)
LYMPH%: 20.7 % (ref 14.0–48.0)
MCH: 30.8 pg (ref 28.0–33.4)
MCHC: 34.4 g/dL (ref 32.0–35.9)
MCV: 90 fL (ref 82–98)
MONO#: 0.8 10*3/uL (ref 0.1–0.9)
MONO%: 13.3 % — ABNORMAL HIGH (ref 0.0–13.0)
NEUT#: 3.8 10*3/uL (ref 1.5–6.5)
NEUT%: 60.8 % (ref 40.0–80.0)
PLATELETS: 196 10*3/uL (ref 145–400)
RBC: 3.83 10*6/uL — AB (ref 4.20–5.70)
RDW: 12.5 % (ref 11.1–15.7)
WBC: 6.3 10*3/uL (ref 4.0–10.0)

## 2017-06-20 LAB — CMP (CANCER CENTER ONLY)
ALT(SGPT): 34 U/L (ref 10–47)
AST: 32 U/L (ref 11–38)
Albumin: 3.4 g/dL (ref 3.3–5.5)
Alkaline Phosphatase: 85 U/L — ABNORMAL HIGH (ref 26–84)
BUN: 15 mg/dL (ref 7–22)
CALCIUM: 9.1 mg/dL (ref 8.0–10.3)
CHLORIDE: 104 meq/L (ref 98–108)
CO2: 30 mEq/L (ref 18–33)
Creat: 1.2 mg/dl (ref 0.6–1.2)
GLUCOSE: 96 mg/dL (ref 73–118)
POTASSIUM: 4.1 meq/L (ref 3.3–4.7)
Sodium: 144 mEq/L (ref 128–145)
TOTAL PROTEIN: 6.4 g/dL (ref 6.4–8.1)
Total Bilirubin: 0.8 mg/dl (ref 0.20–1.60)

## 2017-06-20 LAB — TECHNOLOGIST REVIEW CHCC SATELLITE

## 2017-06-20 MED ORDER — SODIUM CHLORIDE 0.9 % IV SOLN
2400.0000 mg/m2 | INTRAVENOUS | Status: DC
  Administered 2017-06-20: 4500 mg via INTRAVENOUS
  Filled 2017-06-20: qty 90

## 2017-06-20 MED ORDER — PALONOSETRON HCL INJECTION 0.25 MG/5ML
INTRAVENOUS | Status: AC
  Filled 2017-06-20: qty 5

## 2017-06-20 MED ORDER — FLUOROURACIL CHEMO INJECTION 2.5 GM/50ML
400.0000 mg/m2 | Freq: Once | INTRAVENOUS | Status: AC
  Administered 2017-06-20: 750 mg via INTRAVENOUS
  Filled 2017-06-20: qty 15

## 2017-06-20 MED ORDER — DEXAMETHASONE SODIUM PHOSPHATE 10 MG/ML IJ SOLN
INTRAMUSCULAR | Status: AC
  Filled 2017-06-20: qty 1

## 2017-06-20 MED ORDER — ONDANSETRON HCL 8 MG PO TABS: 8.0000 mg | ORAL_TABLET | Freq: Two times a day (BID) | ORAL | 1 refills | Status: DC | PRN

## 2017-06-20 MED ORDER — DEXTROSE 5 % IV SOLN
Freq: Once | INTRAVENOUS | Status: AC
  Administered 2017-06-20: 11:00:00 via INTRAVENOUS

## 2017-06-20 MED ORDER — LEUCOVORIN CALCIUM INJECTION 350 MG
400.0000 mg/m2 | Freq: Once | INTRAVENOUS | Status: AC
  Administered 2017-06-20: 752 mg via INTRAVENOUS
  Filled 2017-06-20: qty 37.6

## 2017-06-20 MED ORDER — PROCHLORPERAZINE MALEATE 10 MG PO TABS: 10.0000 mg | ORAL_TABLET | Freq: Four times a day (QID) | ORAL | 1 refills | Status: DC | PRN

## 2017-06-20 MED ORDER — LORAZEPAM 0.5 MG PO TABS: 0.5000 mg | ORAL_TABLET | Freq: Four times a day (QID) | ORAL | 0 refills | Status: DC | PRN

## 2017-06-20 MED ORDER — PALONOSETRON HCL INJECTION 0.25 MG/5ML
0.2500 mg | Freq: Once | INTRAVENOUS | Status: AC
  Administered 2017-06-20: 0.25 mg via INTRAVENOUS

## 2017-06-20 MED ORDER — DEXAMETHASONE SODIUM PHOSPHATE 10 MG/ML IJ SOLN
10.0000 mg | Freq: Once | INTRAMUSCULAR | Status: AC
  Administered 2017-06-20: 10 mg via INTRAVENOUS

## 2017-06-20 MED ORDER — DEXAMETHASONE 4 MG PO TABS: 8.0000 mg | ORAL_TABLET | Freq: Every day | ORAL | 1 refills | Status: DC

## 2017-06-20 MED ORDER — OXALIPLATIN CHEMO INJECTION 100 MG/20ML
81.0000 mg/m2 | Freq: Once | INTRAVENOUS | Status: AC
  Administered 2017-06-20: 150 mg via INTRAVENOUS
  Filled 2017-06-20: qty 20

## 2017-06-20 NOTE — Patient Instructions (Signed)
[facility] Discharge Instructions for Patients Receiving Chemotherapy  Today you received the following chemotherapy agents Oxaliplatin, Leucovorin, and Fluorouracil.  To help prevent nausea and vomiting after your treatment, we encourage you to take your nausea medications as directed on the bottles.     If you develop nausea and vomiting that is not controlled by your nausea medication, call the clinic.   BELOW ARE SYMPTOMS THAT SHOULD BE REPORTED IMMEDIATELY:  *FEVER GREATER THAN 100.5 F  *CHILLS WITH OR WITHOUT FEVER  NAUSEA AND VOMITING THAT IS NOT CONTROLLED WITH YOUR NAUSEA MEDICATION  *UNUSUAL SHORTNESS OF BREATH  *UNUSUAL BRUISING OR BLEEDING  TENDERNESS IN MOUTH AND THROAT WITH OR WITHOUT PRESENCE OF ULCERS  *URINARY PROBLEMS  *BOWEL PROBLEMS  UNUSUAL RASH Items with * indicate a potential emergency and should be followed up as soon as possible.  Feel free to call the clinic should you have any questions or concerns. The clinic phone number is [phone].  Please show the [facility] at check-in to the Emergency Department and triage nurse. Fluorouracil, 5-FU injection What is this medicine? FLUOROURACIL, 5-FU (flure oh YOOR a sil) is a chemotherapy drug. It slows the growth of cancer cells. This medicine is used to treat many types of cancer like breast cancer, colon or rectal cancer, pancreatic cancer, and stomach cancer. This medicine may be used for other purposes; ask your health care provider or pharmacist if you have questions. COMMON BRAND NAME(S): Adrucil What should I tell my health care provider before I take this medicine? They need to know if you have any of these conditions: -blood disorders -dihydropyrimidine dehydrogenase (DPD) deficiency -infection (especially a virus infection such as chickenpox, cold sores, or herpes) -kidney disease -liver disease -malnourished, poor nutrition -recent or ongoing radiation therapy -an  unusual or allergic reaction to fluorouracil, other chemotherapy, other medicines, foods, dyes, or preservatives -pregnant or trying to get pregnant -breast-feeding How should I use this medicine? This drug is given as an infusion or injection into a vein. It is administered in a hospital or clinic by a specially trained health care professional. Talk to your pediatrician regarding the use of this medicine in children. Special care may be needed. Overdosage: If you think you have taken too much of this medicine contact a poison control center or emergency room at once. NOTE: This medicine is only for you. Do not share this medicine with others. What if I miss a dose? It is important not to miss your dose. Call your doctor or health care professional if you are unable to keep an appointment. What may interact with this medicine? -allopurinol -cimetidine -dapsone -digoxin -hydroxyurea -leucovorin -levamisole -medicines for seizures like ethotoin, fosphenytoin, phenytoin -medicines to increase blood counts like filgrastim, pegfilgrastim, sargramostim -medicines that treat or prevent blood clots like warfarin, enoxaparin, and dalteparin -methotrexate -metronidazole -pyrimethamine -some other chemotherapy drugs like busulfan, cisplatin, estramustine, vinblastine -trimethoprim -trimetrexate -vaccines Talk to your doctor or health care professional before taking any of these medicines: -acetaminophen -aspirin -ibuprofen -ketoprofen -naproxen This list may not describe all possible interactions. Give your health care provider a list of all the medicines, herbs, non-prescription drugs, or dietary supplements you use. Also tell them if you smoke, drink alcohol, or use illegal drugs. Some items may interact with your medicine. What should I watch for while using this medicine? Visit your doctor for checks on your progress. This drug may make you feel generally unwell. This is not uncommon,  as chemotherapy can  affect healthy cells as well as cancer cells. Report any side effects. Continue your course of treatment even though you feel ill unless your doctor tells you to stop. In some cases, you may be given additional medicines to help with side effects. Follow all directions for their use. Call your doctor or health care professional for advice if you get a fever, chills or sore throat, or other symptoms of a cold or flu. Do not treat yourself. This drug decreases your body's ability to fight infections. Try to avoid being around people who are sick. This medicine may increase your risk to bruise or bleed. Call your doctor or health care professional if you notice any unusual bleeding. Be careful brushing and flossing your teeth or using a toothpick because you may get an infection or bleed more easily. If you have any dental work done, tell your dentist you are receiving this medicine. Avoid taking products that contain aspirin, acetaminophen, ibuprofen, naproxen, or ketoprofen unless instructed by your doctor. These medicines may hide a fever. Do not become pregnant while taking this medicine. Women should inform their doctor if they wish to become pregnant or think they might be pregnant. There is a potential for serious side effects to an unborn child. Talk to your health care professional or pharmacist for more information. Do not breast-feed an infant while taking this medicine. Men should inform their doctor if they wish to father a child. This medicine may lower sperm counts. Do not treat diarrhea with over the counter products. Contact your doctor if you have diarrhea that lasts more than 2 days or if it is severe and watery. This medicine can make you more sensitive to the sun. Keep out of the sun. If you cannot avoid being in the sun, wear protective clothing and use sunscreen. Do not use sun lamps or tanning beds/booths. What side effects may I notice from receiving this  medicine? Side effects that you should report to your doctor or health care professional as soon as possible: -allergic reactions like skin rash, itching or hives, swelling of the face, lips, or tongue -low blood counts - this medicine may decrease the number of white blood cells, red blood cells and platelets. You may be at increased risk for infections and bleeding. -signs of infection - fever or chills, cough, sore throat, pain or difficulty passing urine -signs of decreased platelets or bleeding - bruising, pinpoint red spots on the skin, black, tarry stools, blood in the urine -signs of decreased red blood cells - unusually weak or tired, fainting spells, lightheadedness -breathing problems -changes in vision -chest pain -mouth sores -nausea and vomiting -pain, swelling, redness at site where injected -pain, tingling, numbness in the hands or feet -redness, swelling, or sores on hands or feet -stomach pain -unusual bleeding Side effects that usually do not require medical attention (report to your doctor or health care professional if they continue or are bothersome): -changes in finger or toe nails -diarrhea -dry or itchy skin -hair loss -headache -loss of appetite -sensitivity of eyes to the light -stomach upset -unusually teary eyes This list may not describe all possible side effects. Call your doctor for medical advice about side effects. You may report side effects to FDA at 1-800-FDA-1088. Where should I keep my medicine? This drug is given in a hospital or clinic and will not be stored at home. NOTE: This sheet is a summary. It may not cover all possible information. If you have questions about this medicine,  talk to your doctor, pharmacist, or health care provider.  2018 Elsevier/Gold Standard (2007-11-27 13:53:16) Leucovorin injection What is this medicine? LEUCOVORIN (loo koe VOR in) is used to prevent or treat the harmful effects of some medicines. This medicine is  used to treat anemia caused by a low amount of folic acid in the body. It is also used with 5-fluorouracil (5-FU) to treat colon cancer. This medicine may be used for other purposes; ask your health care provider or pharmacist if you have questions. What should I tell my health care provider before I take this medicine? They need to know if you have any of these conditions: -anemia from low levels of vitamin B-12 in the blood -an unusual or allergic reaction to leucovorin, folic acid, other medicines, foods, dyes, or preservatives -pregnant or trying to get pregnant -breast-feeding How should I use this medicine? This medicine is for injection into a muscle or into a vein. It is given by a health care professional in a hospital or clinic setting. Talk to your pediatrician regarding the use of this medicine in children. Special care may be needed. Overdosage: If you think you have taken too much of this medicine contact a poison control center or emergency room at once. NOTE: This medicine is only for you. Do not share this medicine with others. What if I miss a dose? This does not apply. What may interact with this medicine? -capecitabine -fluorouracil -phenobarbital -phenytoin -primidone -trimethoprim-sulfamethoxazole This list may not describe all possible interactions. Give your health care provider a list of all the medicines, herbs, non-prescription drugs, or dietary supplements you use. Also tell them if you smoke, drink alcohol, or use illegal drugs. Some items may interact with your medicine. What should I watch for while using this medicine? Your condition will be monitored carefully while you are receiving this medicine. This medicine may increase the side effects of 5-fluorouracil, 5-FU. Tell your doctor or health care professional if you have diarrhea or mouth sores that do not get better or that get worse. What side effects may I notice from receiving this medicine? Side  effects that you should report to your doctor or health care professional as soon as possible: -allergic reactions like skin rash, itching or hives, swelling of the face, lips, or tongue -breathing problems -fever, infection -mouth sores -unusual bleeding or bruising -unusually weak or tired Side effects that usually do not require medical attention (report to your doctor or health care professional if they continue or are bothersome): -constipation or diarrhea -loss of appetite -nausea, vomiting This list may not describe all possible side effects. Call your doctor for medical advice about side effects. You may report side effects to FDA at 1-800-FDA-1088. Where should I keep my medicine? This drug is given in a hospital or clinic and will not be stored at home. NOTE: This sheet is a summary. It may not cover all possible information. If you have questions about this medicine, talk to your doctor, pharmacist, or health care provider.  2018 Elsevier/Gold Standard (2008-01-28 16:50:29) Oxaliplatin Injection What is this medicine? OXALIPLATIN (ox AL i PLA tin) is a chemotherapy drug. It targets fast dividing cells, like cancer cells, and causes these cells to die. This medicine is used to treat cancers of the colon and rectum, and many other cancers. This medicine may be used for other purposes; ask your health care provider or pharmacist if you have questions. COMMON BRAND NAME(S): Eloxatin What should I tell my health care provider before  I take this medicine? They need to know if you have any of these conditions: -kidney disease -an unusual or allergic reaction to oxaliplatin, other chemotherapy, other medicines, foods, dyes, or preservatives -pregnant or trying to get pregnant -breast-feeding How should I use this medicine? This drug is given as an infusion into a vein. It is administered in a hospital or clinic by a specially trained health care professional. Talk to your  pediatrician regarding the use of this medicine in children. Special care may be needed. Overdosage: If you think you have taken too much of this medicine contact a poison control center or emergency room at once. NOTE: This medicine is only for you. Do not share this medicine with others. What if I miss a dose? It is important not to miss a dose. Call your doctor or health care professional if you are unable to keep an appointment. What may interact with this medicine? -medicines to increase blood counts like filgrastim, pegfilgrastim, sargramostim -probenecid -some antibiotics like amikacin, gentamicin, neomycin, polymyxin B, streptomycin, tobramycin -zalcitabine Talk to your doctor or health care professional before taking any of these medicines: -acetaminophen -aspirin -ibuprofen -ketoprofen -naproxen This list may not describe all possible interactions. Give your health care provider a list of all the medicines, herbs, non-prescription drugs, or dietary supplements you use. Also tell them if you smoke, drink alcohol, or use illegal drugs. Some items may interact with your medicine. What should I watch for while using this medicine? Your condition will be monitored carefully while you are receiving this medicine. You will need important blood work done while you are taking this medicine. This medicine can make you more sensitive to cold. Do not drink cold drinks or use ice. Cover exposed skin before coming in contact with cold temperatures or cold objects. When out in cold weather wear warm clothing and cover your mouth and nose to warm the air that goes into your lungs. Tell your doctor if you get sensitive to the cold. This drug may make you feel generally unwell. This is not uncommon, as chemotherapy can affect healthy cells as well as cancer cells. Report any side effects. Continue your course of treatment even though you feel ill unless your doctor tells you to stop. In some cases, you  may be given additional medicines to help with side effects. Follow all directions for their use. Call your doctor or health care professional for advice if you get a fever, chills or sore throat, or other symptoms of a cold or flu. Do not treat yourself. This drug decreases your body's ability to fight infections. Try to avoid being around people who are sick. This medicine may increase your risk to bruise or bleed. Call your doctor or health care professional if you notice any unusual bleeding. Be careful brushing and flossing your teeth or using a toothpick because you may get an infection or bleed more easily. If you have any dental work done, tell your dentist you are receiving this medicine. Avoid taking products that contain aspirin, acetaminophen, ibuprofen, naproxen, or ketoprofen unless instructed by your doctor. These medicines may hide a fever. Do not become pregnant while taking this medicine. Women should inform their doctor if they wish to become pregnant or think they might be pregnant. There is a potential for serious side effects to an unborn child. Talk to your health care professional or pharmacist for more information. Do not breast-feed an infant while taking this medicine. Call your doctor or health care professional  if you get diarrhea. Do not treat yourself. What side effects may I notice from receiving this medicine? Side effects that you should report to your doctor or health care professional as soon as possible: -allergic reactions like skin rash, itching or hives, swelling of the face, lips, or tongue -low blood counts - This drug may decrease the number of white blood cells, red blood cells and platelets. You may be at increased risk for infections and bleeding. -signs of infection - fever or chills, cough, sore throat, pain or difficulty passing urine -signs of decreased platelets or bleeding - bruising, pinpoint red spots on the skin, black, tarry stools,  nosebleeds -signs of decreased red blood cells - unusually weak or tired, fainting spells, lightheadedness -breathing problems -chest pain, pressure -cough -diarrhea -jaw tightness -mouth sores -nausea and vomiting -pain, swelling, redness or irritation at the injection site -pain, tingling, numbness in the hands or feet -problems with balance, talking, walking -redness, blistering, peeling or loosening of the skin, including inside the mouth -trouble passing urine or change in the amount of urine Side effects that usually do not require medical attention (report to your doctor or health care professional if they continue or are bothersome): -changes in vision -constipation -hair loss -loss of appetite -metallic taste in the mouth or changes in taste -stomach pain This list may not describe all possible side effects. Call your doctor for medical advice about side effects. You may report side effects to FDA at 1-800-FDA-1088. Where should I keep my medicine? This drug is given in a hospital or clinic and will not be stored at home. NOTE: This sheet is a summary. It may not cover all possible information. If you have questions about this medicine, talk to your doctor, pharmacist, or health care provider.  2018 Elsevier/Gold Standard (2008-02-18 17:22:47)

## 2017-06-22 ENCOUNTER — Encounter: Payer: Self-pay | Admitting: *Deleted

## 2017-06-22 ENCOUNTER — Ambulatory Visit (HOSPITAL_BASED_OUTPATIENT_CLINIC_OR_DEPARTMENT_OTHER): Payer: Medicare Other

## 2017-06-22 ENCOUNTER — Encounter (HOSPITAL_COMMUNITY): Payer: Self-pay

## 2017-06-22 VITALS — BP 130/75 | HR 68 | Temp 97.8°F

## 2017-06-22 DIAGNOSIS — C779 Secondary and unspecified malignant neoplasm of lymph node, unspecified: Secondary | ICD-10-CM

## 2017-06-22 DIAGNOSIS — C187 Malignant neoplasm of sigmoid colon: Secondary | ICD-10-CM | POA: Diagnosis not present

## 2017-06-22 MED ORDER — HEPARIN SOD (PORK) LOCK FLUSH 100 UNIT/ML IV SOLN
500.0000 [IU] | Freq: Once | INTRAVENOUS | Status: AC | PRN
  Administered 2017-06-22: 500 [IU]
  Filled 2017-06-22: qty 5

## 2017-06-22 MED ORDER — SODIUM CHLORIDE 0.9% FLUSH
10.0000 mL | INTRAVENOUS | Status: DC | PRN
  Administered 2017-06-22: 10 mL
  Filled 2017-06-22: qty 10

## 2017-06-22 NOTE — Progress Notes (Signed)
Called patient to see how he was doing post chemotherapy.  Patient doing well with no nausea or vomiting.  Patient had hiccups yesterday but were relieved. Patient without complaints.

## 2017-07-03 ENCOUNTER — Other Ambulatory Visit: Payer: Self-pay | Admitting: *Deleted

## 2017-07-03 DIAGNOSIS — C187 Malignant neoplasm of sigmoid colon: Secondary | ICD-10-CM

## 2017-07-04 ENCOUNTER — Ambulatory Visit: Payer: Medicare Other

## 2017-07-04 ENCOUNTER — Encounter: Payer: Self-pay | Admitting: Hematology & Oncology

## 2017-07-04 ENCOUNTER — Other Ambulatory Visit (HOSPITAL_BASED_OUTPATIENT_CLINIC_OR_DEPARTMENT_OTHER): Payer: Medicare Other

## 2017-07-04 ENCOUNTER — Other Ambulatory Visit: Payer: Self-pay

## 2017-07-04 ENCOUNTER — Ambulatory Visit (HOSPITAL_BASED_OUTPATIENT_CLINIC_OR_DEPARTMENT_OTHER): Payer: Medicare Other | Admitting: Hematology & Oncology

## 2017-07-04 ENCOUNTER — Ambulatory Visit (HOSPITAL_BASED_OUTPATIENT_CLINIC_OR_DEPARTMENT_OTHER): Payer: Medicare Other

## 2017-07-04 VITALS — BP 136/75 | HR 77 | Temp 98.3°F | Resp 18 | Wt 163.0 lb

## 2017-07-04 DIAGNOSIS — C187 Malignant neoplasm of sigmoid colon: Secondary | ICD-10-CM

## 2017-07-04 DIAGNOSIS — Z5111 Encounter for antineoplastic chemotherapy: Secondary | ICD-10-CM | POA: Diagnosis present

## 2017-07-04 LAB — CBC WITH DIFFERENTIAL (CANCER CENTER ONLY)
BASO#: 0 10*3/uL (ref 0.0–0.2)
BASO%: 0.4 % (ref 0.0–2.0)
EOS ABS: 0.1 10*3/uL (ref 0.0–0.5)
EOS%: 2.2 % (ref 0.0–7.0)
HEMATOCRIT: 35.7 % — AB (ref 38.7–49.9)
HEMOGLOBIN: 12.3 g/dL — AB (ref 13.0–17.1)
LYMPH#: 1.3 10*3/uL (ref 0.9–3.3)
LYMPH%: 28.8 % (ref 14.0–48.0)
MCH: 30.3 pg (ref 28.0–33.4)
MCHC: 34.5 g/dL (ref 32.0–35.9)
MCV: 88 fL (ref 82–98)
MONO#: 0.5 10*3/uL (ref 0.1–0.9)
MONO%: 10.4 % (ref 0.0–13.0)
NEUT%: 58.2 % (ref 40.0–80.0)
NEUTROS ABS: 2.6 10*3/uL (ref 1.5–6.5)
Platelets: 109 10*3/uL — ABNORMAL LOW (ref 145–400)
RBC: 4.06 10*6/uL — ABNORMAL LOW (ref 4.20–5.70)
RDW: 12.8 % (ref 11.1–15.7)
WBC: 4.5 10*3/uL (ref 4.0–10.0)

## 2017-07-04 LAB — CMP (CANCER CENTER ONLY)
ALBUMIN: 3.3 g/dL (ref 3.3–5.5)
ALT(SGPT): 30 U/L (ref 10–47)
AST: 30 U/L (ref 11–38)
Alkaline Phosphatase: 86 U/L — ABNORMAL HIGH (ref 26–84)
BILIRUBIN TOTAL: 0.9 mg/dL (ref 0.20–1.60)
BUN, Bld: 18 mg/dL (ref 7–22)
CALCIUM: 9 mg/dL (ref 8.0–10.3)
CHLORIDE: 105 meq/L (ref 98–108)
CO2: 30 meq/L (ref 18–33)
Creat: 1.5 mg/dl — ABNORMAL HIGH (ref 0.6–1.2)
GLUCOSE: 107 mg/dL (ref 73–118)
POTASSIUM: 3.5 meq/L (ref 3.3–4.7)
Sodium: 143 mEq/L (ref 128–145)
Total Protein: 6.1 g/dL — ABNORMAL LOW (ref 6.4–8.1)

## 2017-07-04 MED ORDER — DEXAMETHASONE SODIUM PHOSPHATE 10 MG/ML IJ SOLN
INTRAMUSCULAR | Status: AC
  Filled 2017-07-04: qty 1

## 2017-07-04 MED ORDER — DEXAMETHASONE SODIUM PHOSPHATE 10 MG/ML IJ SOLN
10.0000 mg | Freq: Once | INTRAMUSCULAR | Status: AC
  Administered 2017-07-04: 10 mg via INTRAVENOUS

## 2017-07-04 MED ORDER — SODIUM CHLORIDE 0.9% FLUSH
10.0000 mL | Freq: Once | INTRAVENOUS | Status: AC
  Administered 2017-07-04: 10 mL
  Filled 2017-07-04: qty 10

## 2017-07-04 MED ORDER — SODIUM CHLORIDE 0.9% FLUSH
10.0000 mL | INTRAVENOUS | Status: DC | PRN
  Filled 2017-07-04: qty 10

## 2017-07-04 MED ORDER — PALONOSETRON HCL INJECTION 0.25 MG/5ML
0.2500 mg | Freq: Once | INTRAVENOUS | Status: AC
  Administered 2017-07-04: 0.25 mg via INTRAVENOUS

## 2017-07-04 MED ORDER — SODIUM CHLORIDE 0.9 % IV SOLN
2400.0000 mg/m2 | INTRAVENOUS | Status: DC
  Administered 2017-07-04: 4500 mg via INTRAVENOUS
  Filled 2017-07-04: qty 90

## 2017-07-04 MED ORDER — PALONOSETRON HCL INJECTION 0.25 MG/5ML
INTRAVENOUS | Status: AC
  Filled 2017-07-04: qty 5

## 2017-07-04 MED ORDER — OXALIPLATIN CHEMO INJECTION 100 MG/20ML
81.0000 mg/m2 | Freq: Once | INTRAVENOUS | Status: AC
  Administered 2017-07-04: 150 mg via INTRAVENOUS
  Filled 2017-07-04: qty 20

## 2017-07-04 MED ORDER — DEXTROSE 5 % IV SOLN
Freq: Once | INTRAVENOUS | Status: AC
  Administered 2017-07-04: 09:00:00 via INTRAVENOUS

## 2017-07-04 MED ORDER — LEUCOVORIN CALCIUM INJECTION 350 MG
400.0000 mg/m2 | Freq: Once | INTRAVENOUS | Status: AC
  Administered 2017-07-04: 752 mg via INTRAVENOUS
  Filled 2017-07-04: qty 37.6

## 2017-07-04 MED ORDER — HEPARIN SOD (PORK) LOCK FLUSH 100 UNIT/ML IV SOLN
500.0000 [IU] | Freq: Once | INTRAVENOUS | Status: DC | PRN
Start: 2017-07-04 — End: 2017-07-04
  Filled 2017-07-04: qty 5

## 2017-07-04 MED ORDER — FLUOROURACIL CHEMO INJECTION 2.5 GM/50ML
400.0000 mg/m2 | Freq: Once | INTRAVENOUS | Status: AC
  Administered 2017-07-04: 750 mg via INTRAVENOUS
  Filled 2017-07-04: qty 15

## 2017-07-04 NOTE — Progress Notes (Signed)
Hematology and Oncology Follow Up Visit  Jack Gray [id number] 1946/06/23 71 y.o. 07/04/2017   Principle Diagnosis:   Stage IIIB ([id number]) adenocarcinoma of the sigmoid colon  Current Therapy:    Status post cycle 1 of adjuvant FOLFOX     Interim History:  Jack Gray is back for follow-up.  He tolerated his first cycle of chemotherapy quite well.  He had a little bit of tingling in the fingertips.  This is better.  He had no mouth sores.  He had no cough or shortness of breath.  There is been no bleeding.  He has had no diarrhea.  He has had no nausea or vomiting.  He had a very nice Thanksgiving.  He has had no fever.  He has had no chills.  He has had no rashes.  He told me that he needs to have a dental appointment.  I told him that he needs antibiotics before his procedure.  I told him to let me know when he is going to have his procedure.  He also wanted to know if he could play golf.  I told him that he could play as long as it was not raining.  Overall, his performance status is ECOG 1.  Medications:  Current Outpatient Medications:  .  atorvastatin (LIPITOR) 20 MG tablet, Take 20 mg by mouth daily at 6 PM., Disp: , Rfl:  .  Cholecalciferol (VITAMIN D) 2000 units CAPS, Take 2,000 Units by mouth daily., Disp: , Rfl:  .  dexamethasone (DECADRON) 4 MG tablet, Take 2 tablets (8 mg total) daily by mouth. Start the day after chemotherapy for 2 days. Take with food., Disp: 30 tablet, Rfl: 1 .  Glucosamine-Chondroit-Vit C-Mn (GLUCOSAMINE 1500 COMPLEX PO), Take 1,500 mg by mouth 2 (two) times daily., Disp: , Rfl:  .  lidocaine-prilocaine (EMLA) cream, Applying to [facility] site 1 hour prior to chemotherapy appointment.  Place a plastic wrap on top of the cream to keep this against the skin, Disp: 30 g, Rfl: 4 .  LORazepam (ATIVAN) 0.5 MG tablet, Take 1 tablet (0.5 mg total) every 6 (six) hours as needed by mouth (Nausea or vomiting)., Disp: 30 tablet, Rfl: 0 .  Multiple Vitamin  (MULTIVITAMIN WITH MINERALS) TABS tablet, Take 1 tablet by mouth daily., Disp: , Rfl:  .  Multiple Vitamins-Minerals (PRESERVISION AREDS PO), Take 1 tablet by mouth 2 (two) times daily., Disp: , Rfl:  .  ondansetron (ZOFRAN) 8 MG tablet, Take 1 tablet (8 mg total) 2 (two) times daily as needed by mouth for refractory nausea / vomiting. Start on day 3 after chemotherapy., Disp: 30 tablet, Rfl: 1 .  prochlorperazine (COMPAZINE) 10 MG tablet, Take 1 tablet (10 mg total) every 6 (six) hours as needed by mouth (Nausea or vomiting)., Disp: 30 tablet, Rfl: 1  Allergies: No Known Allergies  Past Medical History, Surgical history, Social history, and Family History were reviewed and updated.  Review of Systems: Review of Systems  Constitutional: Negative for appetite change, fatigue, fever and unexpected weight change.  HENT:   Negative for lump/mass, mouth sores, sore throat and trouble swallowing.   Respiratory: Negative for cough, hemoptysis and shortness of breath.   Cardiovascular: Negative for leg swelling and palpitations.  Gastrointestinal: Negative for abdominal distention, abdominal pain, blood in stool, constipation, diarrhea, nausea and vomiting.  Genitourinary: Negative for bladder incontinence, dysuria, frequency and hematuria.   Musculoskeletal: Negative for arthralgias, back pain, gait problem and myalgias.  Skin: Negative for itching and rash.  Neurological: Negative for dizziness, extremity weakness, gait problem, headaches, numbness, seizures and speech difficulty.  Hematological: Does not bruise/bleed easily.  Psychiatric/Behavioral: Negative for depression and sleep disturbance. The patient is not nervous/anxious.     Physical Exam:  weight is 163 lb (73.9 kg). His oral temperature is 98.3 F (36.8 C). His blood pressure is 136/75 and his pulse is 77. His respiration is 18.   Wt Readings from Last 3 Encounters:  07/04/17 163 lb (73.9 kg)  06/20/17 158 lb 1.9 oz (71.7 kg)    06/18/17 162 lb (73.5 kg)    Physical Exam  Constitutional: He is oriented to person, place, and time.  HENT:  Head: Normocephalic and atraumatic.  Mouth/Throat: Oropharynx is clear and moist.  Eyes: EOM are normal. Pupils are equal, round, and reactive to light.  Neck: Normal range of motion.  Cardiovascular: Normal rate, regular rhythm and normal heart sounds.  Pulmonary/Chest: Effort normal and breath sounds normal.  Abdominal: Soft. Bowel sounds are normal.  Musculoskeletal: Normal range of motion. He exhibits no edema, tenderness or deformity.  Lymphadenopathy:    He has no cervical adenopathy.  Neurological: He is alert and oriented to person, place, and time.  Skin: Skin is warm and dry. No rash noted. No erythema.  Psychiatric: He has a normal mood and affect. His behavior is normal. Judgment and thought content normal.  Vitals reviewed.    Lab Results  Component Value Date   WBC 4.5 07/04/2017   HGB 12.3 (L) 07/04/2017   HCT 35.7 (L) 07/04/2017   MCV 88 07/04/2017   PLT 109 (L) 07/04/2017     Chemistry      Component Value Date/Time   NA 143 07/04/2017 0756   K 3.5 07/04/2017 0756   CL 105 07/04/2017 0756   CO2 30 07/04/2017 0756   BUN 18 07/04/2017 0756   CREATININE 1.5 (H) 07/04/2017 0756      Component Value Date/Time   CALCIUM 9.0 07/04/2017 0756   ALKPHOS 86 (H) 07/04/2017 0756   AST 30 07/04/2017 0756   ALT 30 07/04/2017 0756   BILITOT 0.90 07/04/2017 0756         Impression and Plan: Jack Gray is a 71 year old white male.  He has a stage IIIb adenocarcinoma of the sigmoid colon.  He had this resected.  Even though he had negative lymph nodes, he had to nodules which may have been lymph nodes.  I believe that he is a great candidate for adjuvant chemotherapy.  He will get 8 cycles of treatment.  I think this would be reasonable.  I will plan to get him back in 2 more weeks.   Volanda Napoleon, MD 11/28/20188:30 AM

## 2017-07-04 NOTE — Patient Instructions (Signed)
Implanted Port Home Guide An implanted port is a type of central line that is placed under the skin. Central lines are used to provide IV access when treatment or nutrition needs to be given through a person's veins. Implanted ports are used for long-term IV access. An implanted port may be placed because:  You need IV medicine that would be irritating to the small veins in your hands or arms.  You need long-term IV medicines, such as antibiotics.  You need IV nutrition for a long period.  You need frequent blood draws for lab tests.  You need dialysis.  Implanted ports are usually placed in the chest area, but they can also be placed in the upper arm, the abdomen, or the leg. An implanted port has two main parts:  Reservoir. The reservoir is round and will appear as a small, raised area under your skin. The reservoir is the part where a needle is inserted to give medicines or draw blood.  Catheter. The catheter is a thin, flexible tube that extends from the reservoir. The catheter is placed into a large vein. Medicine that is inserted into the reservoir goes into the catheter and then into the vein.  How will I care for my incision site? Do not get the incision site wet. Bathe or shower as directed by your health care provider. How is my port accessed? Special steps must be taken to access the port:  Before the port is accessed, a numbing cream can be placed on the skin. This helps numb the skin over the port site.  Your health care provider uses a sterile technique to access the port. ? Your health care provider must put on a mask and sterile gloves. ? The skin over your port is cleaned carefully with an antiseptic and allowed to dry. ? The port is gently pinched between sterile gloves, and a needle is inserted into the port.  Only "non-coring" port needles should be used to access the port. Once the port is accessed, a blood return should be checked. This helps ensure that the port  is in the vein and is not clogged.  If your port needs to remain accessed for a constant infusion, a clear (transparent) bandage will be placed over the needle site. The bandage and needle will need to be changed every week, or as directed by your health care provider.  Keep the bandage covering the needle clean and dry. Do not get it wet. Follow your health care provider's instructions on how to take a shower or bath while the port is accessed.  If your port does not need to stay accessed, no bandage is needed over the port.  What is flushing? Flushing helps keep the port from getting clogged. Follow your health care provider's instructions on how and when to flush the port. Ports are usually flushed with saline solution or a medicine called heparin. The need for flushing will depend on how the port is used.  If the port is used for intermittent medicines or blood draws, the port will need to be flushed: ? After medicines have been given. ? After blood has been drawn. ? As part of routine maintenance.  If a constant infusion is running, the port may not need to be flushed.  How long will my port stay implanted? The port can stay in for as long as your health care provider thinks it is needed. When it is time for the port to come out, surgery will be   done to remove it. The procedure is similar to the one performed when the port was put in. When should I seek immediate medical care? When you have an implanted port, you should seek immediate medical care if:  You notice a bad smell coming from the incision site.  You have swelling, redness, or drainage at the incision site.  You have more swelling or pain at the port site or the surrounding area.  You have a fever that is not controlled with medicine.  This information is not intended to replace advice given to you by your health care provider. Make sure you discuss any questions you have with your health care provider. Document  Released: 07/24/2005 Document Revised: 12/30/2015 Document Reviewed: 03/31/2013 Elsevier Interactive Patient Education  2017 [employer]  

## 2017-07-04 NOTE — Patient Instructions (Signed)
[facility] Discharge Instructions for Patients Receiving Chemotherapy  Today you received the following chemotherapy agents Oxaliplatin/Leucovorin/5 FU To help prevent nausea and vomiting after your treatment, we encourage you to take your nausea medication as prescribed.   If you develop nausea and vomiting that is not controlled by your nausea medication, call the clinic.   BELOW ARE SYMPTOMS THAT SHOULD BE REPORTED IMMEDIATELY:  *FEVER GREATER THAN 100.5 F  *CHILLS WITH OR WITHOUT FEVER  NAUSEA AND VOMITING THAT IS NOT CONTROLLED WITH YOUR NAUSEA MEDICATION  *UNUSUAL SHORTNESS OF BREATH  *UNUSUAL BRUISING OR BLEEDING  TENDERNESS IN MOUTH AND THROAT WITH OR WITHOUT PRESENCE OF ULCERS  *URINARY PROBLEMS  *BOWEL PROBLEMS  UNUSUAL RASH Items with * indicate a potential emergency and should be followed up as soon as possible.  Feel free to call the clinic should you have any questions or concerns. The clinic phone number is [phone].  Please show the [place] [facility] at check-in to the Emergency Department and triage nurse.

## 2017-07-06 ENCOUNTER — Ambulatory Visit (HOSPITAL_BASED_OUTPATIENT_CLINIC_OR_DEPARTMENT_OTHER): Payer: Medicare Other

## 2017-07-06 VITALS — BP 145/77 | HR 70 | Temp 98.0°F | Resp 18

## 2017-07-06 DIAGNOSIS — C187 Malignant neoplasm of sigmoid colon: Secondary | ICD-10-CM

## 2017-07-06 MED ORDER — HEPARIN SOD (PORK) LOCK FLUSH 100 UNIT/ML IV SOLN
500.0000 [IU] | Freq: Once | INTRAVENOUS | Status: AC | PRN
  Administered 2017-07-06: 500 [IU]
  Filled 2017-07-06: qty 5

## 2017-07-06 MED ORDER — SODIUM CHLORIDE 0.9% FLUSH
10.0000 mL | INTRAVENOUS | Status: DC | PRN
  Administered 2017-07-06: 10 mL
  Filled 2017-07-06: qty 10

## 2017-07-06 NOTE — Patient Instructions (Signed)
Implanted Port Home Guide An implanted port is a type of central line that is placed under the skin. Central lines are used to provide IV access when treatment or nutrition needs to be given through a person's veins. Implanted ports are used for long-term IV access. An implanted port may be placed because:  You need IV medicine that would be irritating to the small veins in your hands or arms.  You need long-term IV medicines, such as antibiotics.  You need IV nutrition for a long period.  You need frequent blood draws for lab tests.  You need dialysis.  Implanted ports are usually placed in the chest area, but they can also be placed in the upper arm, the abdomen, or the leg. An implanted port has two main parts:  Reservoir. The reservoir is round and will appear as a small, raised area under your skin. The reservoir is the part where a needle is inserted to give medicines or draw blood.  Catheter. The catheter is a thin, flexible tube that extends from the reservoir. The catheter is placed into a large vein. Medicine that is inserted into the reservoir goes into the catheter and then into the vein.  How will I care for my incision site? Do not get the incision site wet. Bathe or shower as directed by your health care provider. How is my port accessed? Special steps must be taken to access the port:  Before the port is accessed, a numbing cream can be placed on the skin. This helps numb the skin over the port site.  Your health care provider uses a sterile technique to access the port. ? Your health care provider must put on a mask and sterile gloves. ? The skin over your port is cleaned carefully with an antiseptic and allowed to dry. ? The port is gently pinched between sterile gloves, and a needle is inserted into the port.  Only "non-coring" port needles should be used to access the port. Once the port is accessed, a blood return should be checked. This helps ensure that the port  is in the vein and is not clogged.  If your port needs to remain accessed for a constant infusion, a clear (transparent) bandage will be placed over the needle site. The bandage and needle will need to be changed every week, or as directed by your health care provider.  Keep the bandage covering the needle clean and dry. Do not get it wet. Follow your health care provider's instructions on how to take a shower or bath while the port is accessed.  If your port does not need to stay accessed, no bandage is needed over the port.  What is flushing? Flushing helps keep the port from getting clogged. Follow your health care provider's instructions on how and when to flush the port. Ports are usually flushed with saline solution or a medicine called heparin. The need for flushing will depend on how the port is used.  If the port is used for intermittent medicines or blood draws, the port will need to be flushed: ? After medicines have been given. ? After blood has been drawn. ? As part of routine maintenance.  If a constant infusion is running, the port may not need to be flushed.  How long will my port stay implanted? The port can stay in for as long as your health care provider thinks it is needed. When it is time for the port to come out, surgery will be   done to remove it. The procedure is similar to the one performed when the port was put in. When should I seek immediate medical care? When you have an implanted port, you should seek immediate medical care if:  You notice a bad smell coming from the incision site.  You have swelling, redness, or drainage at the incision site.  You have more swelling or pain at the port site or the surrounding area.  You have a fever that is not controlled with medicine.  This information is not intended to replace advice given to you by your health care provider. Make sure you discuss any questions you have with your health care provider. Document  Released: 07/24/2005 Document Revised: 12/30/2015 Document Reviewed: 03/31/2013 Elsevier Interactive Patient Education  2017 [employer]  

## 2017-07-09 ENCOUNTER — Telehealth: Payer: Self-pay | Admitting: *Deleted

## 2017-07-09 NOTE — Telephone Encounter (Signed)
Patient c/o red spots to his skin on his hands and face. He also states he has dryness to the skin on his hands and face. Denies any other symptoms.  Reviewed with Dr Marin Olp. He would like patient to try Eucerin cream and to notify the office if the rash becomes worse, or spreads.   Patient is aware of instructions. Will call office if needed.

## 2017-07-18 ENCOUNTER — Ambulatory Visit (HOSPITAL_BASED_OUTPATIENT_CLINIC_OR_DEPARTMENT_OTHER): Payer: Medicare Other

## 2017-07-18 ENCOUNTER — Ambulatory Visit (HOSPITAL_BASED_OUTPATIENT_CLINIC_OR_DEPARTMENT_OTHER): Payer: Medicare Other | Admitting: Hematology & Oncology

## 2017-07-18 ENCOUNTER — Other Ambulatory Visit: Payer: Self-pay

## 2017-07-18 ENCOUNTER — Other Ambulatory Visit (HOSPITAL_BASED_OUTPATIENT_CLINIC_OR_DEPARTMENT_OTHER): Payer: Medicare Other

## 2017-07-18 ENCOUNTER — Other Ambulatory Visit: Payer: Self-pay | Admitting: *Deleted

## 2017-07-18 VITALS — BP 130/84 | HR 81 | Temp 97.8°F | Resp 20 | Wt 162.0 lb

## 2017-07-18 DIAGNOSIS — C187 Malignant neoplasm of sigmoid colon: Secondary | ICD-10-CM | POA: Diagnosis not present

## 2017-07-18 DIAGNOSIS — Z5111 Encounter for antineoplastic chemotherapy: Secondary | ICD-10-CM

## 2017-07-18 DIAGNOSIS — R251 Tremor, unspecified: Secondary | ICD-10-CM | POA: Diagnosis not present

## 2017-07-18 LAB — CBC WITH DIFFERENTIAL (CANCER CENTER ONLY)
BASO#: 0 10*3/uL (ref 0.0–0.2)
BASO%: 1 % (ref 0.0–2.0)
EOS%: 2.3 % (ref 0.0–7.0)
Eosinophils Absolute: 0.1 10*3/uL (ref 0.0–0.5)
HCT: 36 % — ABNORMAL LOW (ref 38.7–49.9)
HGB: 12.6 g/dL — ABNORMAL LOW (ref 13.0–17.1)
LYMPH#: 1.3 10*3/uL (ref 0.9–3.3)
LYMPH%: 34.4 % (ref 14.0–48.0)
MCH: 30.5 pg (ref 28.0–33.4)
MCHC: 35 g/dL (ref 32.0–35.9)
MCV: 87 fL (ref 82–98)
MONO#: 0.5 10*3/uL (ref 0.1–0.9)
MONO%: 13.8 % — ABNORMAL HIGH (ref 0.0–13.0)
NEUT#: 1.9 10*3/uL (ref 1.5–6.5)
NEUT%: 48.5 % (ref 40.0–80.0)
PLATELETS: 91 10*3/uL — AB (ref 145–400)
RBC: 4.13 10*6/uL — ABNORMAL LOW (ref 4.20–5.70)
RDW: 13.4 % (ref 11.1–15.7)
WBC: 3.9 10*3/uL — ABNORMAL LOW (ref 4.0–10.0)

## 2017-07-18 LAB — CMP (CANCER CENTER ONLY)
ALK PHOS: 84 U/L (ref 26–84)
ALT: 53 U/L — AB (ref 10–47)
AST: 46 U/L — AB (ref 11–38)
Albumin: 3.5 g/dL (ref 3.3–5.5)
BILIRUBIN TOTAL: 1.4 mg/dL (ref 0.20–1.60)
BUN: 16 mg/dL (ref 7–22)
CALCIUM: 9.5 mg/dL (ref 8.0–10.3)
CO2: 30 mEq/L (ref 18–33)
Chloride: 106 mEq/L (ref 98–108)
Creat: 1.3 mg/dl — ABNORMAL HIGH (ref 0.6–1.2)
GLUCOSE: 100 mg/dL (ref 73–118)
Potassium: 3.6 mEq/L (ref 3.3–4.7)
Sodium: 147 mEq/L — ABNORMAL HIGH (ref 128–145)
Total Protein: 6.5 g/dL (ref 6.4–8.1)

## 2017-07-18 LAB — CEA (IN HOUSE-CHCC): CEA (CHCC-IN HOUSE): 1.97 ng/mL (ref 0.00–5.00)

## 2017-07-18 MED ORDER — PALONOSETRON HCL INJECTION 0.25 MG/5ML
INTRAVENOUS | Status: AC
  Filled 2017-07-18: qty 5

## 2017-07-18 MED ORDER — AMOXICILLIN 500 MG PO TABS: ORAL_TABLET | ORAL | 0 refills | Status: DC

## 2017-07-18 MED ORDER — DEXTROSE 5 % IV SOLN
Freq: Once | INTRAVENOUS | Status: AC
  Administered 2017-07-18: 12:00:00 via INTRAVENOUS

## 2017-07-18 MED ORDER — HEPARIN SOD (PORK) LOCK FLUSH 100 UNIT/ML IV SOLN
500.0000 [IU] | Freq: Once | INTRAVENOUS | Status: DC | PRN
  Filled 2017-07-18: qty 5

## 2017-07-18 MED ORDER — DEXAMETHASONE SODIUM PHOSPHATE 10 MG/ML IJ SOLN
10.0000 mg | Freq: Once | INTRAMUSCULAR | Status: AC
  Administered 2017-07-18: 10 mg via INTRAVENOUS

## 2017-07-18 MED ORDER — PALONOSETRON HCL INJECTION 0.25 MG/5ML
0.2500 mg | Freq: Once | INTRAVENOUS | Status: AC
  Administered 2017-07-18: 0.25 mg via INTRAVENOUS

## 2017-07-18 MED ORDER — DEXAMETHASONE SODIUM PHOSPHATE 10 MG/ML IJ SOLN
INTRAMUSCULAR | Status: AC
  Filled 2017-07-18: qty 1

## 2017-07-18 MED ORDER — LEUCOVORIN CALCIUM INJECTION 350 MG
400.0000 mg/m2 | Freq: Once | INTRAVENOUS | Status: AC
  Administered 2017-07-18: 752 mg via INTRAVENOUS
  Filled 2017-07-18: qty 37.6

## 2017-07-18 MED ORDER — SODIUM CHLORIDE 0.9 % IV SOLN
2400.0000 mg/m2 | INTRAVENOUS | Status: DC
  Administered 2017-07-18: 4500 mg via INTRAVENOUS
  Filled 2017-07-18: qty 90

## 2017-07-18 MED ORDER — SODIUM CHLORIDE 0.9% FLUSH
10.0000 mL | INTRAVENOUS | Status: DC | PRN
  Filled 2017-07-18: qty 10

## 2017-07-18 MED ORDER — FLUOROURACIL CHEMO INJECTION 2.5 GM/50ML
400.0000 mg/m2 | Freq: Once | INTRAVENOUS | Status: AC
  Administered 2017-07-18: 750 mg via INTRAVENOUS
  Filled 2017-07-18: qty 15

## 2017-07-18 MED ORDER — OXALIPLATIN CHEMO INJECTION 100 MG/20ML
150.0000 mg | Freq: Once | INTRAVENOUS | Status: AC
  Administered 2017-07-18: 150 mg via INTRAVENOUS
  Filled 2017-07-18: qty 20

## 2017-07-18 NOTE — Patient Instructions (Signed)
[facility] Discharge Instructions for Patients Receiving Chemotherapy  Today you received the following chemotherapy agents Oxaliplatin, Leucovorin, 5FU  To help prevent nausea and vomiting after your treatment, we encourage you to take your nausea medication    If you develop nausea and vomiting that is not controlled by your nausea medication, call the clinic.   BELOW ARE SYMPTOMS THAT SHOULD BE REPORTED IMMEDIATELY:  *FEVER GREATER THAN 100.5 F  *CHILLS WITH OR WITHOUT FEVER  NAUSEA AND VOMITING THAT IS NOT CONTROLLED WITH YOUR NAUSEA MEDICATION  *UNUSUAL SHORTNESS OF BREATH  *UNUSUAL BRUISING OR BLEEDING  TENDERNESS IN MOUTH AND THROAT WITH OR WITHOUT PRESENCE OF ULCERS  *URINARY PROBLEMS  *BOWEL PROBLEMS  UNUSUAL RASH Items with * indicate a potential emergency and should be followed up as soon as possible.  Feel free to call the clinic should you have any questions or concerns. The clinic phone number is [phone].  Please show the [facility] at check-in to the Emergency Department and triage nurse.

## 2017-07-18 NOTE — Progress Notes (Signed)
OK to treat per Dr. Niel Hummer .

## 2017-07-18 NOTE — Progress Notes (Signed)
Hematology and Oncology Follow Up Visit  Jack Gray [id number] 1945/11/12 71 y.o. 07/18/2017   Principle Diagnosis:   Stage IIIB ([id number]) adenocarcinoma of the sigmoid colon  Current Therapy:    Status post cycle #2 of adjuvant FOLFOX     Interim History:  Jack Gray is back for follow-up.  So far, he has been doing pretty well.  He and his wife actually went to the beach on Friday.  They had to come back early because of the snowstorm.  Thankfully, they did not get Snowden at home.  He has a tremor in his thumbs.  This mostly is pronounced in the right thumb.  It is a fine type of tremor.  I do not know if this is a manifestation of neuropathy from his chemotherapy.  He has had no mouth sores.  He has had no diarrhea.  He has had a little bit of a rash.  Again I think this probably is from the 5-FU infusion.  He has had no fever.  He has had no leg swelling.  He has had no headache.   Overall, his performance status is ECOG 1.  Medications:  Current Outpatient Medications:  .  atorvastatin (LIPITOR) 20 MG tablet, Take 20 mg by mouth daily at 6 PM., Disp: , Rfl:  .  Cholecalciferol (VITAMIN D) 2000 units CAPS, Take 2,000 Units by mouth daily., Disp: , Rfl:  .  dexamethasone (DECADRON) 4 MG tablet, Take 2 tablets (8 mg total) daily by mouth. Start the day after chemotherapy for 2 days. Take with food., Disp: 30 tablet, Rfl: 1 .  lidocaine-prilocaine (EMLA) cream, Applying to [facility] site 1 hour prior to chemotherapy appointment.  Place a plastic wrap on top of the cream to keep this against the skin, Disp: 30 g, Rfl: 4 .  Multiple Vitamin (MULTIVITAMIN WITH MINERALS) TABS tablet, Take 1 tablet by mouth daily., Disp: , Rfl:  .  Multiple Vitamins-Minerals (PRESERVISION AREDS PO), Take 1 tablet by mouth 2 (two) times daily., Disp: , Rfl:  .  ondansetron (ZOFRAN) 8 MG tablet, Take 1 tablet (8 mg total) 2 (two) times daily as needed by mouth for refractory nausea / vomiting. Start on day 3  after chemotherapy., Disp: 30 tablet, Rfl: 1 .  amoxicillin (AMOXIL) 500 MG tablet, Take 4 pills one hour PRIOR to dental procedure., Disp: 4 tablet, Rfl: 0 .  LORazepam (ATIVAN) 0.5 MG tablet, Take 1 tablet (0.5 mg total) every 6 (six) hours as needed by mouth (Nausea or vomiting). (Patient not taking: Reported on 07/18/2017), Disp: 30 tablet, Rfl: 0 .  prochlorperazine (COMPAZINE) 10 MG tablet, Take 1 tablet (10 mg total) every 6 (six) hours as needed by mouth (Nausea or vomiting). (Patient not taking: Reported on 07/18/2017), Disp: 30 tablet, Rfl: 1  Allergies: No Known Allergies  Past Medical History, Surgical history, Social history, and Family History were reviewed and updated.  Review of Systems: Review of Systems  Constitutional: Negative for appetite change, fatigue, fever and unexpected weight change.  HENT:   Negative for lump/mass, mouth sores, sore throat and trouble swallowing.   Respiratory: Negative for cough, hemoptysis and shortness of breath.   Cardiovascular: Negative for leg swelling and palpitations.  Gastrointestinal: Negative for abdominal distention, abdominal pain, blood in stool, constipation, diarrhea, nausea and vomiting.  Genitourinary: Negative for bladder incontinence, dysuria, frequency and hematuria.   Musculoskeletal: Negative for arthralgias, back pain, gait problem and myalgias.  Skin: Negative for itching and rash.  Neurological: Negative  for dizziness, extremity weakness, gait problem, headaches, numbness, seizures and speech difficulty.  Hematological: Does not bruise/bleed easily.  Psychiatric/Behavioral: Negative for depression and sleep disturbance. The patient is not nervous/anxious.     Physical Exam:  weight is 162 lb (73.5 kg). His oral temperature is 97.8 F (36.6 C). His blood pressure is 130/84 and his pulse is 81. His respiration is 20 and oxygen saturation is 99%.   Wt Readings from Last 3 Encounters:  07/18/17 162 lb (73.5 kg)    07/04/17 163 lb (73.9 kg)  06/20/17 158 lb 1.9 oz (71.7 kg)    Physical Exam  Constitutional: He is oriented to person, place, and time.  HENT:  Head: Normocephalic and atraumatic.  Mouth/Throat: Oropharynx is clear and moist.  Eyes: EOM are normal. Pupils are equal, round, and reactive to light.  Neck: Normal range of motion.  Cardiovascular: Normal rate, regular rhythm and normal heart sounds.  Pulmonary/Chest: Effort normal and breath sounds normal.  Abdominal: Soft. Bowel sounds are normal.  Musculoskeletal: Normal range of motion. He exhibits no edema, tenderness or deformity.  Lymphadenopathy:    He has no cervical adenopathy.  Neurological: He is alert and oriented to person, place, and time.  Skin: Skin is warm and dry. No rash noted. No erythema.  Psychiatric: He has a normal mood and affect. His behavior is normal. Judgment and thought content normal.  Vitals reviewed.    Lab Results  Component Value Date   WBC 3.9 (L) 07/18/2017   HGB 12.6 (L) 07/18/2017   HCT 36.0 (L) 07/18/2017   MCV 87 07/18/2017   PLT 91 (L) 07/18/2017     Chemistry      Component Value Date/Time   NA 147 (H) 07/18/2017 0938   K 3.6 07/18/2017 0938   CL 106 07/18/2017 0938   CO2 30 07/18/2017 0938   BUN 16 07/18/2017 0938   CREATININE 1.3 (H) 07/18/2017 0938      Component Value Date/Time   CALCIUM 9.5 07/18/2017 0938   ALKPHOS 84 07/18/2017 0938   AST 46 (H) 07/18/2017 0938   ALT 53 (H) 07/18/2017 0938   BILITOT 1.40 07/18/2017 0938       Impression and Plan: Jack Gray is a 71 year old white male.  He has a stage IIIb adenocarcinoma of the sigmoid colon.  He had this resected.  Even though he had negative lymph nodes, he had to nodules which may have been lymph nodes.  We will go ahead with his third cycle of treatment today.  I will then give him an extra week off in between Christmas and New Year's.  I think this will help with his blood counts.  I am not sure why he has this  tremor in his thumbs.  Again this might be from the oxaliplatin and could be a manifestation of neuropathy.   Volanda Napoleon, MD 12/12/201811:24 AM

## 2017-07-20 ENCOUNTER — Ambulatory Visit (HOSPITAL_BASED_OUTPATIENT_CLINIC_OR_DEPARTMENT_OTHER): Payer: Medicare Other

## 2017-07-20 VITALS — BP 150/86 | HR 76 | Temp 98.0°F | Resp 16

## 2017-07-20 DIAGNOSIS — Z452 Encounter for adjustment and management of vascular access device: Secondary | ICD-10-CM

## 2017-07-20 DIAGNOSIS — C187 Malignant neoplasm of sigmoid colon: Secondary | ICD-10-CM | POA: Diagnosis not present

## 2017-07-20 MED ORDER — SODIUM CHLORIDE 0.9% FLUSH
10.0000 mL | INTRAVENOUS | Status: DC | PRN
  Administered 2017-07-20: 10 mL
  Filled 2017-07-20: qty 10

## 2017-07-20 MED ORDER — HEPARIN SOD (PORK) LOCK FLUSH 100 UNIT/ML IV SOLN
500.0000 [IU] | Freq: Once | INTRAVENOUS | Status: AC | PRN
  Administered 2017-07-20: 500 [IU]
  Filled 2017-07-20: qty 5

## 2017-07-20 NOTE — Patient Instructions (Signed)
Fluorouracil, 5-FU injection What is this medicine? FLUOROURACIL, 5-FU (flure oh YOOR a sil) is a chemotherapy drug. It slows the growth of cancer cells. This medicine is used to treat many types of cancer like breast cancer, colon or rectal cancer, pancreatic cancer, and stomach cancer. This medicine may be used for other purposes; ask your health care provider or pharmacist if you have questions. COMMON BRAND NAME(S): Adrucil What should I tell my health care provider before I take this medicine? They need to know if you have any of these conditions: -blood disorders -dihydropyrimidine dehydrogenase (DPD) deficiency -infection (especially a virus infection such as chickenpox, cold sores, or herpes) -kidney disease -liver disease -malnourished, poor nutrition -recent or ongoing radiation therapy -an unusual or allergic reaction to fluorouracil, other chemotherapy, other medicines, foods, dyes, or preservatives -pregnant or trying to get pregnant -breast-feeding How should I use this medicine? This drug is given as an infusion or injection into a vein. It is administered in a hospital or clinic by a specially trained health care professional. Talk to your pediatrician regarding the use of this medicine in children. Special care may be needed. Overdosage: If you think you have taken too much of this medicine contact a poison control center or emergency room at once. NOTE: This medicine is only for you. Do not share this medicine with others. What if I miss a dose? It is important not to miss your dose. Call your doctor or health care professional if you are unable to keep an appointment. What may interact with this medicine? -allopurinol -cimetidine -dapsone -digoxin -hydroxyurea -leucovorin -levamisole -medicines for seizures like ethotoin, fosphenytoin, phenytoin -medicines to increase blood counts like filgrastim, pegfilgrastim, sargramostim -medicines that treat or prevent blood  clots like warfarin, enoxaparin, and dalteparin -methotrexate -metronidazole -pyrimethamine -some other chemotherapy drugs like busulfan, cisplatin, estramustine, vinblastine -trimethoprim -trimetrexate -vaccines Talk to your doctor or health care professional before taking any of these medicines: -acetaminophen -aspirin -ibuprofen -ketoprofen -naproxen This list may not describe all possible interactions. Give your health care provider a list of all the medicines, herbs, non-prescription drugs, or dietary supplements you use. Also tell them if you smoke, drink alcohol, or use illegal drugs. Some items may interact with your medicine. What should I watch for while using this medicine? Visit your doctor for checks on your progress. This drug may make you feel generally unwell. This is not uncommon, as chemotherapy can affect healthy cells as well as cancer cells. Report any side effects. Continue your course of treatment even though you feel ill unless your doctor tells you to stop. In some cases, you may be given additional medicines to help with side effects. Follow all directions for their use. Call your doctor or health care professional for advice if you get a fever, chills or sore throat, or other symptoms of a cold or flu. Do not treat yourself. This drug decreases your body's ability to fight infections. Try to avoid being around people who are sick. This medicine may increase your risk to bruise or bleed. Call your doctor or health care professional if you notice any unusual bleeding. Be careful brushing and flossing your teeth or using a toothpick because you may get an infection or bleed more easily. If you have any dental work done, tell your dentist you are receiving this medicine. Avoid taking products that contain aspirin, acetaminophen, ibuprofen, naproxen, or ketoprofen unless instructed by your doctor. These medicines may hide a fever. Do not become pregnant while taking this  medicine. Women should inform their doctor if they wish to become pregnant or think they might be pregnant. There is a potential for serious side effects to an unborn child. Talk to your health care professional or pharmacist for more information. Do not breast-feed an infant while taking this medicine. Men should inform their doctor if they wish to father a child. This medicine may lower sperm counts. Do not treat diarrhea with over the counter products. Contact your doctor if you have diarrhea that lasts more than 2 days or if it is severe and watery. This medicine can make you more sensitive to the sun. Keep out of the sun. If you cannot avoid being in the sun, wear protective clothing and use sunscreen. Do not use sun lamps or tanning beds/booths. What side effects may I notice from receiving this medicine? Side effects that you should report to your doctor or health care professional as soon as possible: -allergic reactions like skin rash, itching or hives, swelling of the face, lips, or tongue -low blood counts - this medicine may decrease the number of white blood cells, red blood cells and platelets. You may be at increased risk for infections and bleeding. -signs of infection - fever or chills, cough, sore throat, pain or difficulty passing urine -signs of decreased platelets or bleeding - bruising, pinpoint red spots on the skin, black, tarry stools, blood in the urine -signs of decreased red blood cells - unusually weak or tired, fainting spells, lightheadedness -breathing problems -changes in vision -chest pain -mouth sores -nausea and vomiting -pain, swelling, redness at site where injected -pain, tingling, numbness in the hands or feet -redness, swelling, or sores on hands or feet -stomach pain -unusual bleeding Side effects that usually do not require medical attention (report to your doctor or health care professional if they continue or are bothersome): -changes in finger or  toe nails -diarrhea -dry or itchy skin -hair loss -headache -loss of appetite -sensitivity of eyes to the light -stomach upset -unusually teary eyes This list may not describe all possible side effects. Call your doctor for medical advice about side effects. You may report side effects to FDA at 1-800-FDA-1088. Where should I keep my medicine? This drug is given in a hospital or clinic and will not be stored at home. NOTE: This sheet is a summary. It may not cover all possible information. If you have questions about this medicine, talk to your doctor, pharmacist, or health care provider.  2018 Elsevier/Gold Standard (2007-11-27 13:53:16)

## 2017-07-26 DIAGNOSIS — E785 Hyperlipidemia, unspecified: Secondary | ICD-10-CM | POA: Diagnosis not present

## 2017-07-26 DIAGNOSIS — R7309 Other abnormal glucose: Secondary | ICD-10-CM | POA: Diagnosis not present

## 2017-07-26 DIAGNOSIS — C187 Malignant neoplasm of sigmoid colon: Secondary | ICD-10-CM | POA: Diagnosis not present

## 2017-07-26 DIAGNOSIS — R251 Tremor, unspecified: Secondary | ICD-10-CM | POA: Diagnosis not present

## 2017-07-26 DIAGNOSIS — Z86010 Personal history of colonic polyps: Secondary | ICD-10-CM | POA: Diagnosis not present

## 2017-07-26 DIAGNOSIS — H35369 Drusen (degenerative) of macula, unspecified eye: Secondary | ICD-10-CM | POA: Diagnosis not present

## 2017-07-26 DIAGNOSIS — C4481 Basal cell carcinoma of overlapping sites of skin: Secondary | ICD-10-CM | POA: Diagnosis not present

## 2017-08-01 ENCOUNTER — Ambulatory Visit: Payer: Medicare Other | Admitting: Hematology & Oncology

## 2017-08-01 ENCOUNTER — Ambulatory Visit: Payer: Medicare Other

## 2017-08-01 ENCOUNTER — Other Ambulatory Visit: Payer: Medicare Other

## 2017-08-08 ENCOUNTER — Ambulatory Visit: Payer: Medicare Other

## 2017-08-08 ENCOUNTER — Other Ambulatory Visit: Payer: Self-pay

## 2017-08-08 ENCOUNTER — Ambulatory Visit (HOSPITAL_BASED_OUTPATIENT_CLINIC_OR_DEPARTMENT_OTHER): Payer: Medicare Other | Admitting: Hematology & Oncology

## 2017-08-08 ENCOUNTER — Other Ambulatory Visit (HOSPITAL_BASED_OUTPATIENT_CLINIC_OR_DEPARTMENT_OTHER): Payer: Medicare Other

## 2017-08-08 ENCOUNTER — Ambulatory Visit (HOSPITAL_BASED_OUTPATIENT_CLINIC_OR_DEPARTMENT_OTHER): Payer: Medicare Other

## 2017-08-08 VITALS — BP 130/70 | HR 81 | Temp 98.1°F | Resp 17 | Wt 166.5 lb

## 2017-08-08 DIAGNOSIS — C187 Malignant neoplasm of sigmoid colon: Secondary | ICD-10-CM

## 2017-08-08 DIAGNOSIS — M25572 Pain in left ankle and joints of left foot: Secondary | ICD-10-CM

## 2017-08-08 DIAGNOSIS — Z5111 Encounter for antineoplastic chemotherapy: Secondary | ICD-10-CM | POA: Diagnosis present

## 2017-08-08 DIAGNOSIS — M25472 Effusion, left ankle: Secondary | ICD-10-CM | POA: Diagnosis not present

## 2017-08-08 LAB — CBC WITH DIFFERENTIAL (CANCER CENTER ONLY)
BASO#: 0 10*3/uL (ref 0.0–0.2)
BASO%: 0.6 % (ref 0.0–2.0)
EOS%: 2.2 % (ref 0.0–7.0)
Eosinophils Absolute: 0.1 10*3/uL (ref 0.0–0.5)
HCT: 33.9 % — ABNORMAL LOW (ref 38.7–49.9)
HGB: 12.1 g/dL — ABNORMAL LOW (ref 13.0–17.1)
LYMPH#: 1.4 10*3/uL (ref 0.9–3.3)
LYMPH%: 38.1 % (ref 14.0–48.0)
MCH: 31.5 pg (ref 28.0–33.4)
MCHC: 35.7 g/dL (ref 32.0–35.9)
MCV: 88 fL (ref 82–98)
MONO#: 1 10*3/uL — AB (ref 0.1–0.9)
MONO%: 27.1 % — ABNORMAL HIGH (ref 0.0–13.0)
NEUT#: 1.2 10*3/uL — ABNORMAL LOW (ref 1.5–6.5)
NEUT%: 32 % — AB (ref 40.0–80.0)
RBC: 3.84 10*6/uL — AB (ref 4.20–5.70)
RDW: 15.4 % (ref 11.1–15.7)
WBC: 3.6 10*3/uL — AB (ref 4.0–10.0)

## 2017-08-08 LAB — CMP (CANCER CENTER ONLY)
ALK PHOS: 86 U/L — AB (ref 26–84)
ALT: 64 U/L — AB (ref 10–47)
AST: 45 U/L — AB (ref 11–38)
Albumin: 3.4 g/dL (ref 3.3–5.5)
BUN: 15 mg/dL (ref 7–22)
CO2: 29 mEq/L (ref 18–33)
CREATININE: 1.1 mg/dL (ref 0.6–1.2)
Calcium: 9.2 mg/dL (ref 8.0–10.3)
Chloride: 105 mEq/L (ref 98–108)
GLUCOSE: 134 mg/dL — AB (ref 73–118)
Potassium: 3.7 mEq/L (ref 3.3–4.7)
Sodium: 141 mEq/L (ref 128–145)
TOTAL PROTEIN: 6 g/dL — AB (ref 6.4–8.1)
Total Bilirubin: 1.1 mg/dl (ref 0.20–1.60)

## 2017-08-08 LAB — CEA (IN HOUSE-CHCC): CEA (CHCC-In House): 1.88 ng/mL (ref 0.00–5.00)

## 2017-08-08 LAB — LACTATE DEHYDROGENASE: LDH: 200 U/L (ref 125–245)

## 2017-08-08 MED ORDER — LEUCOVORIN CALCIUM INJECTION 350 MG
400.0000 mg/m2 | Freq: Once | INTRAVENOUS | Status: AC
  Administered 2017-08-08: 752 mg via INTRAVENOUS
  Filled 2017-08-08: qty 37.6

## 2017-08-08 MED ORDER — PALONOSETRON HCL INJECTION 0.25 MG/5ML
0.2500 mg | Freq: Once | INTRAVENOUS | Status: AC
  Administered 2017-08-08: 0.25 mg via INTRAVENOUS

## 2017-08-08 MED ORDER — FLUOROURACIL CHEMO INJECTION 5 GM/100ML
2400.0000 mg/m2 | INTRAVENOUS | Status: DC
  Administered 2017-08-08: 4500 mg via INTRAVENOUS
  Filled 2017-08-08: qty 90

## 2017-08-08 MED ORDER — FLUOROURACIL CHEMO INJECTION 2.5 GM/50ML
400.0000 mg/m2 | Freq: Once | INTRAVENOUS | Status: AC
  Administered 2017-08-08: 750 mg via INTRAVENOUS
  Filled 2017-08-08: qty 15

## 2017-08-08 MED ORDER — DEXAMETHASONE SODIUM PHOSPHATE 10 MG/ML IJ SOLN
10.0000 mg | Freq: Once | INTRAMUSCULAR | Status: AC
  Administered 2017-08-08: 10 mg via INTRAVENOUS

## 2017-08-08 MED ORDER — DEXTROSE 5 % IV SOLN
Freq: Once | INTRAVENOUS | Status: AC
  Administered 2017-08-08: 10:00:00 via INTRAVENOUS

## 2017-08-08 MED ORDER — DEXAMETHASONE SODIUM PHOSPHATE 10 MG/ML IJ SOLN
INTRAMUSCULAR | Status: AC
  Filled 2017-08-08: qty 1

## 2017-08-08 MED ORDER — PALONOSETRON HCL INJECTION 0.25 MG/5ML
INTRAVENOUS | Status: AC
  Filled 2017-08-08: qty 5

## 2017-08-08 MED ORDER — OXALIPLATIN CHEMO INJECTION 100 MG/20ML
81.0000 mg/m2 | Freq: Once | INTRAVENOUS | Status: AC
  Administered 2017-08-08: 150 mg via INTRAVENOUS
  Filled 2017-08-08: qty 20

## 2017-08-08 NOTE — Progress Notes (Signed)
OK to treat with PLT and ANC counts today per Dr. Marin Olp

## 2017-08-08 NOTE — Progress Notes (Signed)
Hematology and Oncology Follow Up Visit  Jack Gray [id number] 01/09/1946 72 y.o. 08/08/2017   Principle Diagnosis:   Stage IIIB (T3N1cM0) adenocarcinoma of the sigmoid colon  Current Therapy:    Status post cycle #3 of adjuvant FOLFOX     Interim History:  Mr. Jack Gray is back for follow-up.  He had a very good Christmas and New Year's.  He has been eating quite well.  His only complaint has been some pain and swelling about the left ankle.  I think we have to worry about a thrombus.  I will see about getting a Doppler of his left leg.  He has had no issues with nausea or vomiting.  He has had no cough or shortness of breath.  He has had no rashes.  He has had no fever.  He has had no bleeding.  He has had no change in bowel or bladder habits.    Overall, his performance status is ECOG 1.  Medications:  Current Outpatient Medications:  .  b complex vitamins tablet, Take 1 tablet by mouth daily., Disp: , Rfl:  .  atorvastatin (LIPITOR) 20 MG tablet, Take 20 mg by mouth daily at 6 PM., Disp: , Rfl:  .  Cholecalciferol (VITAMIN D) 2000 units CAPS, Take 2,000 Units by mouth daily., Disp: , Rfl:  .  dexamethasone (DECADRON) 4 MG tablet, Take 2 tablets (8 mg total) daily by mouth. Start the day after chemotherapy for 2 days. Take with food., Disp: 30 tablet, Rfl: 1 .  lidocaine-prilocaine (EMLA) cream, Applying to [facility] site 1 hour prior to chemotherapy appointment.  Place a plastic wrap on top of the cream to keep this against the skin, Disp: 30 g, Rfl: 4 .  LORazepam (ATIVAN) 0.5 MG tablet, Take 1 tablet (0.5 mg total) every 6 (six) hours as needed by mouth (Nausea or vomiting). (Patient not taking: Reported on 07/18/2017), Disp: 30 tablet, Rfl: 0 .  Multiple Vitamins-Minerals (PRESERVISION AREDS PO), Take 1 tablet by mouth 2 (two) times daily., Disp: , Rfl:  .  ondansetron (ZOFRAN) 8 MG tablet, Take 1 tablet (8 mg total) 2 (two) times daily as needed by mouth for refractory nausea /  vomiting. Start on day 3 after chemotherapy., Disp: 30 tablet, Rfl: 1 .  prochlorperazine (COMPAZINE) 10 MG tablet, Take 1 tablet (10 mg total) every 6 (six) hours as needed by mouth (Nausea or vomiting). (Patient not taking: Reported on 07/18/2017), Disp: 30 tablet, Rfl: 1  Allergies: No Known Allergies  Past Medical History, Surgical history, Social history, and Family History were reviewed and updated.  Review of Systems: Review of Systems  Constitutional: Negative for appetite change, fatigue, fever and unexpected weight change.  HENT:   Negative for lump/mass, mouth sores, sore throat and trouble swallowing.   Respiratory: Negative for cough, hemoptysis and shortness of breath.   Cardiovascular: Negative for leg swelling and palpitations.  Gastrointestinal: Negative for abdominal distention, abdominal pain, blood in stool, constipation, diarrhea, nausea and vomiting.  Genitourinary: Negative for bladder incontinence, dysuria, frequency and hematuria.   Musculoskeletal: Negative for arthralgias, back pain, gait problem and myalgias.  Skin: Negative for itching and rash.  Neurological: Negative for dizziness, extremity weakness, gait problem, headaches, numbness, seizures and speech difficulty.  Hematological: Does not bruise/bleed easily.  Psychiatric/Behavioral: Negative for depression and sleep disturbance. The patient is not nervous/anxious.     Physical Exam:  weight is 166 lb 8 oz (75.5 kg). His oral temperature is 98.1 F (36.7 C). His  blood pressure is 130/70 and his pulse is 81. His respiration is 17 and oxygen saturation is 100%.   Wt Readings from Last 3 Encounters:  08/08/17 166 lb 8 oz (75.5 kg)  07/18/17 162 lb (73.5 kg)  07/04/17 163 lb (73.9 kg)    Physical Exam  Constitutional: He is oriented to person, place, and time.  HENT:  Head: Normocephalic and atraumatic.  Mouth/Throat: Oropharynx is clear and moist.  Eyes: EOM are normal. Pupils are equal, round, and  reactive to light.  Neck: Normal range of motion.  Cardiovascular: Normal rate, regular rhythm and normal heart sounds.  Pulmonary/Chest: Effort normal and breath sounds normal.  Abdominal: Soft. Bowel sounds are normal.  Musculoskeletal: Normal range of motion. He exhibits no edema, tenderness or deformity.  Lymphadenopathy:    He has no cervical adenopathy.  Neurological: He is alert and oriented to person, place, and time.  Skin: Skin is warm and dry. No rash noted. No erythema.  Psychiatric: He has a normal mood and affect. His behavior is normal. Judgment and thought content normal.  Vitals reviewed.    Lab Results  Component Value Date   WBC 3.6 (L) 08/08/2017   HGB 12.1 (L) 08/08/2017   HCT 33.9 (L) 08/08/2017   MCV 88 08/08/2017   PLT 94 Platelet count consistent in citrate (L) 08/08/2017     Chemistry      Component Value Date/Time   NA 141 08/08/2017 0802   K 3.7 08/08/2017 0802   CL 105 08/08/2017 0802   CO2 29 08/08/2017 0802   BUN 15 08/08/2017 0802   CREATININE 1.1 08/08/2017 0802      Component Value Date/Time   CALCIUM 9.2 08/08/2017 0802   ALKPHOS 86 (H) 08/08/2017 0802   AST 45 (H) 08/08/2017 0802   ALT 64 (H) 08/08/2017 0802   BILITOT 1.10 08/08/2017 0802       Impression and Plan: Mr. Jack Gray is a 72 year old white male.  He has a stage IIIb adenocarcinoma of the sigmoid colon.  He had this resected.  Even though he had negative lymph nodes, he had 2 nodules which may have been lymph nodes.  We will go ahead with his fourth cycle of treatment today.  Again, we will see about getting a Doppler test done.  We will plan to get him back in 2 more weeks.  Volanda Napoleon, MD 1/2/20199:50 AM

## 2017-08-08 NOTE — Addendum Note (Signed)
Addended by: Burney Gauze R on: 08/08/2017 10:20 AM   Modules accepted: Orders

## 2017-08-08 NOTE — Patient Instructions (Signed)
[facility] Discharge Instructions for Patients Receiving Chemotherapy  Today you received the following chemotherapy agents Oxaliplatin, Leucovorin, 5FU  To help prevent nausea and vomiting after your treatment, we encourage you to take your nausea medication, BUT NO ZOFRAN FOR 3 DAYS     If you develop nausea and vomiting that is not controlled by your nausea medication, call the clinic.   BELOW ARE SYMPTOMS THAT SHOULD BE REPORTED IMMEDIATELY:  *FEVER GREATER THAN 100.5 F  *CHILLS WITH OR WITHOUT FEVER  NAUSEA AND VOMITING THAT IS NOT CONTROLLED WITH YOUR NAUSEA MEDICATION  *UNUSUAL SHORTNESS OF BREATH  *UNUSUAL BRUISING OR BLEEDING  TENDERNESS IN MOUTH AND THROAT WITH OR WITHOUT PRESENCE OF ULCERS  *URINARY PROBLEMS  *BOWEL PROBLEMS  UNUSUAL RASH Items with * indicate a potential emergency and should be followed up as soon as possible.  Feel free to call the clinic should you have any questions or concerns. The clinic phone number is [phone].  Please show the [place] at check-in to the Emergency Department and triage nurse.

## 2017-08-10 ENCOUNTER — Other Ambulatory Visit: Payer: Self-pay

## 2017-08-10 ENCOUNTER — Ambulatory Visit (HOSPITAL_BASED_OUTPATIENT_CLINIC_OR_DEPARTMENT_OTHER)
Admission: RE | Admit: 2017-08-10 | Discharge: 2017-08-10 | Disposition: A | Discharge status: Home / Self Care | Payer: Medicare Other | Source: Ambulatory Visit | Attending: Hematology & Oncology | Admitting: Hematology & Oncology

## 2017-08-10 ENCOUNTER — Ambulatory Visit (HOSPITAL_BASED_OUTPATIENT_CLINIC_OR_DEPARTMENT_OTHER): Payer: Medicare Other

## 2017-08-10 VITALS — BP 132/79 | HR 74 | Temp 98.2°F | Resp 18

## 2017-08-10 DIAGNOSIS — C187 Malignant neoplasm of sigmoid colon: Secondary | ICD-10-CM | POA: Diagnosis not present

## 2017-08-10 DIAGNOSIS — M25572 Pain in left ankle and joints of left foot: Secondary | ICD-10-CM | POA: Diagnosis not present

## 2017-08-10 DIAGNOSIS — R600 Localized edema: Secondary | ICD-10-CM | POA: Diagnosis not present

## 2017-08-10 MED ORDER — HEPARIN SOD (PORK) LOCK FLUSH 100 UNIT/ML IV SOLN
500.0000 [IU] | Freq: Once | INTRAVENOUS | Status: AC | PRN
  Administered 2017-08-10: 500 [IU]
  Filled 2017-08-10: qty 5

## 2017-08-10 MED ORDER — SODIUM CHLORIDE 0.9% FLUSH
10.0000 mL | INTRAVENOUS | Status: DC | PRN
  Administered 2017-08-10: 10 mL
  Filled 2017-08-10: qty 10

## 2017-08-10 NOTE — Patient Instructions (Signed)
Implanted Port Home Guide An implanted port is a type of central line that is placed under the skin. Central lines are used to provide IV access when treatment or nutrition needs to be given through a person's veins. Implanted ports are used for long-term IV access. An implanted port may be placed because:  You need IV medicine that would be irritating to the small veins in your hands or arms.  You need long-term IV medicines, such as antibiotics.  You need IV nutrition for a long period.  You need frequent blood draws for lab tests.  You need dialysis.  Implanted ports are usually placed in the chest area, but they can also be placed in the upper arm, the abdomen, or the leg. An implanted port has two main parts:  Reservoir. The reservoir is round and will appear as a small, raised area under your skin. The reservoir is the part where a needle is inserted to give medicines or draw blood.  Catheter. The catheter is a thin, flexible tube that extends from the reservoir. The catheter is placed into a large vein. Medicine that is inserted into the reservoir goes into the catheter and then into the vein.  How will I care for my incision site? Do not get the incision site wet. Bathe or shower as directed by your health care provider. How is my port accessed? Special steps must be taken to access the port:  Before the port is accessed, a numbing cream can be placed on the skin. This helps numb the skin over the port site.  Your health care provider uses a sterile technique to access the port. ? Your health care provider must put on a mask and sterile gloves. ? The skin over your port is cleaned carefully with an antiseptic and allowed to dry. ? The port is gently pinched between sterile gloves, and a needle is inserted into the port.  Only "non-coring" port needles should be used to access the port. Once the port is accessed, a blood return should be checked. This helps ensure that the port  is in the vein and is not clogged.  If your port needs to remain accessed for a constant infusion, a clear (transparent) bandage will be placed over the needle site. The bandage and needle will need to be changed every week, or as directed by your health care provider.  Keep the bandage covering the needle clean and dry. Do not get it wet. Follow your health care provider's instructions on how to take a shower or bath while the port is accessed.  If your port does not need to stay accessed, no bandage is needed over the port.  What is flushing? Flushing helps keep the port from getting clogged. Follow your health care provider's instructions on how and when to flush the port. Ports are usually flushed with saline solution or a medicine called heparin. The need for flushing will depend on how the port is used.  If the port is used for intermittent medicines or blood draws, the port will need to be flushed: ? After medicines have been given. ? After blood has been drawn. ? As part of routine maintenance.  If a constant infusion is running, the port may not need to be flushed.  How long will my port stay implanted? The port can stay in for as long as your health care provider thinks it is needed. When it is time for the port to come out, surgery will be   done to remove it. The procedure is similar to the one performed when the port was put in. When should I seek immediate medical care? When you have an implanted port, you should seek immediate medical care if:  You notice a bad smell coming from the incision site.  You have swelling, redness, or drainage at the incision site.  You have more swelling or pain at the port site or the surrounding area.  You have a fever that is not controlled with medicine.  This information is not intended to replace advice given to you by your health care provider. Make sure you discuss any questions you have with your health care provider. Document  Released: 07/24/2005 Document Revised: 12/30/2015 Document Reviewed: 03/31/2013 Elsevier Interactive Patient Education  2017 [employer]  

## 2017-08-13 ENCOUNTER — Telehealth: Payer: Self-pay | Admitting: *Deleted

## 2017-08-13 NOTE — Telephone Encounter (Addendum)
Patient is aware of results  ----- Message from Volanda Napoleon, MD sent at 08/10/2017  4:58 PM EST ----- Call - there is NO blood clot in your legs!! Jack Gray

## 2017-08-22 ENCOUNTER — Inpatient Hospital Stay: Payer: Medicare Other

## 2017-08-22 ENCOUNTER — Other Ambulatory Visit: Payer: Self-pay

## 2017-08-22 ENCOUNTER — Inpatient Hospital Stay: Payer: Medicare Other | Attending: Hematology & Oncology | Admitting: Hematology & Oncology

## 2017-08-22 VITALS — BP 132/84 | HR 83 | Temp 98.1°F | Resp 20 | Wt 167.0 lb

## 2017-08-22 DIAGNOSIS — Z5111 Encounter for antineoplastic chemotherapy: Secondary | ICD-10-CM | POA: Diagnosis not present

## 2017-08-22 DIAGNOSIS — Z79899 Other long term (current) drug therapy: Secondary | ICD-10-CM | POA: Insufficient documentation

## 2017-08-22 DIAGNOSIS — R5383 Other fatigue: Secondary | ICD-10-CM | POA: Diagnosis not present

## 2017-08-22 DIAGNOSIS — J3489 Other specified disorders of nose and nasal sinuses: Secondary | ICD-10-CM | POA: Insufficient documentation

## 2017-08-22 DIAGNOSIS — R609 Edema, unspecified: Secondary | ICD-10-CM | POA: Insufficient documentation

## 2017-08-22 DIAGNOSIS — G629 Polyneuropathy, unspecified: Secondary | ICD-10-CM | POA: Insufficient documentation

## 2017-08-22 DIAGNOSIS — C187 Malignant neoplasm of sigmoid colon: Secondary | ICD-10-CM

## 2017-08-22 DIAGNOSIS — R140 Abdominal distension (gaseous): Secondary | ICD-10-CM | POA: Insufficient documentation

## 2017-08-22 DIAGNOSIS — Z95828 Presence of other vascular implants and grafts: Secondary | ICD-10-CM

## 2017-08-22 LAB — LACTATE DEHYDROGENASE: LDH: 207 U/L (ref 125–245)

## 2017-08-22 LAB — CMP (CANCER CENTER ONLY)
ALT: 41 U/L (ref 0–55)
AST: 48 U/L — ABNORMAL HIGH (ref 5–34)
Albumin: 3.5 g/dL (ref 3.5–5.0)
Alkaline Phosphatase: 84 U/L (ref 26–84)
Anion gap: 10 (ref 5–15)
BUN: 17 mg/dL (ref 7–22)
CHLORIDE: 106 mmol/L (ref 98–108)
CO2: 30 mmol/L (ref 18–33)
Calcium: 9.6 mg/dL (ref 8.0–10.3)
Creatinine: 1.2 mg/dL (ref 0.70–1.30)
Glucose, Bld: 107 mg/dL (ref 70–118)
POTASSIUM: 4.1 mmol/L (ref 3.3–4.7)
SODIUM: 146 mmol/L — AB (ref 128–145)
Total Bilirubin: 1 mg/dL (ref 0.2–1.2)
Total Protein: 6.3 g/dL — ABNORMAL LOW (ref 6.4–8.1)

## 2017-08-22 LAB — CBC WITH DIFFERENTIAL (CANCER CENTER ONLY)
Basophils Absolute: 0 10*3/uL (ref 0.0–0.1)
Basophils Relative: 0 %
EOS ABS: 0.1 10*3/uL (ref 0.0–0.5)
EOS PCT: 1 %
HCT: 34.2 % — ABNORMAL LOW (ref 38.7–49.9)
Hemoglobin: 11.8 g/dL — ABNORMAL LOW (ref 13.0–17.1)
LYMPHS ABS: 1.2 10*3/uL (ref 0.9–3.3)
Lymphocytes Relative: 36 %
MCH: 31 pg (ref 28.0–33.4)
MCHC: 34.5 g/dL (ref 32.0–35.9)
MCV: 89.8 fL (ref 82.0–98.0)
MONOS PCT: 14 %
Monocytes Absolute: 0.5 10*3/uL (ref 0.1–0.9)
Neutro Abs: 1.7 10*3/uL (ref 1.5–6.5)
Neutrophils Relative %: 49 %
PLATELETS: 78 10*3/uL — AB (ref 140–400)
RBC: 3.81 MIL/uL — AB (ref 4.20–5.70)
RDW: 15.7 % (ref 11.1–15.7)
WBC: 3.5 10*3/uL — AB (ref 4.0–10.3)

## 2017-08-22 LAB — CEA (IN HOUSE-CHCC): CEA (CHCC-In House): 2.27 ng/mL (ref 0.00–5.00)

## 2017-08-22 MED ORDER — FLUOROURACIL CHEMO INJECTION 2.5 GM/50ML
400.0000 mg/m2 | Freq: Once | INTRAVENOUS | Status: AC
  Administered 2017-08-22: 750 mg via INTRAVENOUS
  Filled 2017-08-22: qty 15

## 2017-08-22 MED ORDER — DEXAMETHASONE SODIUM PHOSPHATE 10 MG/ML IJ SOLN
10.0000 mg | Freq: Once | INTRAMUSCULAR | Status: AC
  Administered 2017-08-22: 10 mg via INTRAVENOUS

## 2017-08-22 MED ORDER — DEXAMETHASONE SODIUM PHOSPHATE 10 MG/ML IJ SOLN
INTRAMUSCULAR | Status: AC
  Filled 2017-08-22: qty 1

## 2017-08-22 MED ORDER — SODIUM CHLORIDE 0.9 % IV SOLN
2400.0000 mg/m2 | INTRAVENOUS | Status: DC
  Administered 2017-08-22: 4500 mg via INTRAVENOUS
  Filled 2017-08-22: qty 90

## 2017-08-22 MED ORDER — DEXTROSE 5 % IV SOLN
Freq: Once | INTRAVENOUS | Status: AC
  Administered 2017-08-22: 10:00:00 via INTRAVENOUS

## 2017-08-22 MED ORDER — PALONOSETRON HCL INJECTION 0.25 MG/5ML
0.2500 mg | Freq: Once | INTRAVENOUS | Status: AC
  Administered 2017-08-22: 0.25 mg via INTRAVENOUS

## 2017-08-22 MED ORDER — PALONOSETRON HCL INJECTION 0.25 MG/5ML
INTRAVENOUS | Status: AC
  Filled 2017-08-22: qty 5

## 2017-08-22 MED ORDER — OXALIPLATIN CHEMO INJECTION 100 MG/20ML
150.0000 mg | Freq: Once | INTRAVENOUS | Status: AC
  Administered 2017-08-22: 150 mg via INTRAVENOUS
  Filled 2017-08-22: qty 10

## 2017-08-22 MED ORDER — LEUCOVORIN CALCIUM INJECTION 350 MG
400.0000 mg/m2 | Freq: Once | INTRAVENOUS | Status: AC
  Administered 2017-08-22: 752 mg via INTRAVENOUS
  Filled 2017-08-22: qty 37.6

## 2017-08-22 NOTE — Progress Notes (Signed)
Hematology and Oncology Follow Up Visit  Jack Gray [id number] 1945-09-01 72 y.o. 08/22/2017   Principle Diagnosis:   Stage IIIB (T3N1cM0) adenocarcinoma of the sigmoid colon  Current Therapy:    Status post cycle #4 of adjuvant FOLFOX     Interim History:  Jack Gray is back for follow-up.  Everything is doing okay.  He and his wife were at the beach last weekend.  They had a very good time.  He does have some neuropathy.  He is still able to do what he wishes to do.  He has had no problems with diarrhea.  He has had no mouth sores.  He has had no cough or shortness of breath.  He does have a little bit more fatigue.  He wants to play golf down in [place] in March.  I think that that would be okay.  He has had no issues with nausea or vomiting.  He did have a little bit of leg swelling last time I saw him.  A Doppler of his left leg.  There is no thrombus.  Overall, his performance status is ECOG 1.  Medications:  Current Outpatient Medications:  .  atorvastatin (LIPITOR) 20 MG tablet, Take 20 mg by mouth daily at 6 PM., Disp: , Rfl:  .  b complex vitamins tablet, Take 1 tablet by mouth daily., Disp: , Rfl:  .  Cholecalciferol (VITAMIN D) 2000 units CAPS, Take 2,000 Units by mouth daily., Disp: , Rfl:  .  dexamethasone (DECADRON) 4 MG tablet, Take 2 tablets (8 mg total) daily by mouth. Start the day after chemotherapy for 2 days. Take with food., Disp: 30 tablet, Rfl: 1 .  lidocaine-prilocaine (EMLA) cream, Applying to [facility] site 1 hour prior to chemotherapy appointment.  Place a plastic wrap on top of the cream to keep this against the skin, Disp: 30 g, Rfl: 4 .  LORazepam (ATIVAN) 0.5 MG tablet, Take 1 tablet (0.5 mg total) every 6 (six) hours as needed by mouth (Nausea or vomiting)., Disp: 30 tablet, Rfl: 0 .  Multiple Vitamins-Minerals (PRESERVISION AREDS PO), Take 1 tablet by mouth 2 (two) times daily., Disp: , Rfl:  .  ondansetron (ZOFRAN) 8 MG tablet, Take 1 tablet (8 mg  total) 2 (two) times daily as needed by mouth for refractory nausea / vomiting. Start on day 3 after chemotherapy., Disp: 30 tablet, Rfl: 1 .  prochlorperazine (COMPAZINE) 10 MG tablet, Take 1 tablet (10 mg total) every 6 (six) hours as needed by mouth (Nausea or vomiting)., Disp: 30 tablet, Rfl: 1  Allergies: No Known Allergies  Past Medical History, Surgical history, Social history, and Family History were reviewed and updated.  Review of Systems: Review of Systems  Constitutional: Negative for appetite change, fatigue, fever and unexpected weight change.  HENT:   Negative for lump/mass, mouth sores, sore throat and trouble swallowing.   Respiratory: Negative for cough, hemoptysis and shortness of breath.   Cardiovascular: Negative for leg swelling and palpitations.  Gastrointestinal: Negative for abdominal distention, abdominal pain, blood in stool, constipation, diarrhea, nausea and vomiting.  Genitourinary: Negative for bladder incontinence, dysuria, frequency and hematuria.   Musculoskeletal: Negative for arthralgias, back pain, gait problem and myalgias.  Skin: Negative for itching and rash.  Neurological: Negative for dizziness, extremity weakness, gait problem, headaches, numbness, seizures and speech difficulty.  Hematological: Does not bruise/bleed easily.  Psychiatric/Behavioral: Negative for depression and sleep disturbance. The patient is not nervous/anxious.     Physical Exam:  weight is  167 lb 0.6 oz (75.8 kg). His oral temperature is 98.1 F (36.7 C). His blood pressure is 132/84 and his pulse is 83. His respiration is 20 and oxygen saturation is 98%.   Wt Readings from Last 3 Encounters:  08/22/17 167 lb 0.6 oz (75.8 kg)  08/08/17 166 lb 8 oz (75.5 kg)  07/18/17 162 lb (73.5 kg)    Physical Exam  Constitutional: He is oriented to person, place, and time.  HENT:  Head: Normocephalic and atraumatic.  Mouth/Throat: Oropharynx is clear and moist.  Eyes: EOM are  normal. Pupils are equal, round, and reactive to light.  Neck: Normal range of motion.  Cardiovascular: Normal rate, regular rhythm and normal heart sounds.  Pulmonary/Chest: Effort normal and breath sounds normal.  Abdominal: Soft. Bowel sounds are normal.  Musculoskeletal: Normal range of motion. He exhibits no edema, tenderness or deformity.  Lymphadenopathy:    He has no cervical adenopathy.  Neurological: He is alert and oriented to person, place, and time.  Skin: Skin is warm and dry. No rash noted. No erythema.  Psychiatric: He has a normal mood and affect. His behavior is normal. Judgment and thought content normal.  Vitals reviewed.    Lab Results  Component Value Date   WBC 3.6 (L) 08/08/2017   HGB 12.1 (L) 08/08/2017   HCT 34.2 (L) 08/22/2017   MCV 89.8 08/22/2017   PLT 94 Platelet count consistent in citrate (L) 08/08/2017     Chemistry      Component Value Date/Time   NA 146 (H) 08/22/2017 0814   NA 141 08/08/2017 0802   K 4.1 08/22/2017 0814   K 3.7 08/08/2017 0802   CL 106 08/22/2017 0814   CL 105 08/08/2017 0802   CO2 30 08/22/2017 0814   CO2 29 08/08/2017 0802   BUN 17 08/22/2017 0814   BUN 15 08/08/2017 0802   CREATININE 1.1 08/08/2017 0802      Component Value Date/Time   CALCIUM 9.6 08/22/2017 0814   CALCIUM 9.2 08/08/2017 0802   ALKPHOS 84 08/22/2017 0814   ALKPHOS 86 (H) 08/08/2017 0802   AST 48 (H) 08/22/2017 0814   ALT 41 08/22/2017 0814   ALT 64 (H) 08/08/2017 0802   BILITOT 1.0 08/22/2017 0814       Impression and Plan: Jack Gray is a 72 year old white male.  He has a stage IIIb adenocarcinoma of the sigmoid colon.  He had this resected.  Even though he had negative lymph nodes, he had 2 nodules which may have been lymph nodes.  We will try for 8 cycles of treatment.  I think this would be appropriate for his stage of disease.  His blood counts are trending downward.  I want to try to keep him on an every 2-week schedule.  I probably  will omit the oxaliplatin with his next cycle, depending on his blood counts.  I explained my surveillance protocol to them.  I told him that I would do a follow-up CT scan in about 4-6 weeks after treatment.  We would then follow-up with CT scan every 4 months for the first year and then every 6 months after that.  They understand this very well.  I will get him back in 2 more weeks.  Volanda Napoleon, MD 1/16/20199:27 AM

## 2017-08-22 NOTE — Progress Notes (Signed)
9:29 AM Ok to treat with platelet and WBC count per Dr. Marin Olp.

## 2017-08-22 NOTE — Patient Instructions (Signed)
Implanted Port Home Guide An implanted port is a type of central line that is placed under the skin. Central lines are used to provide IV access when treatment or nutrition needs to be given through a person's veins. Implanted ports are used for long-term IV access. An implanted port may be placed because:  You need IV medicine that would be irritating to the small veins in your hands or arms.  You need long-term IV medicines, such as antibiotics.  You need IV nutrition for a long period.  You need frequent blood draws for lab tests.  You need dialysis.  Implanted ports are usually placed in the chest area, but they can also be placed in the upper arm, the abdomen, or the leg. An implanted port has two main parts:  Reservoir. The reservoir is round and will appear as a small, raised area under your skin. The reservoir is the part where a needle is inserted to give medicines or draw blood.  Catheter. The catheter is a thin, flexible tube that extends from the reservoir. The catheter is placed into a large vein. Medicine that is inserted into the reservoir goes into the catheter and then into the vein.  How will I care for my incision site? Do not get the incision site wet. Bathe or shower as directed by your health care provider. How is my port accessed? Special steps must be taken to access the port:  Before the port is accessed, a numbing cream can be placed on the skin. This helps numb the skin over the port site.  Your health care provider uses a sterile technique to access the port. ? Your health care provider must put on a mask and sterile gloves. ? The skin over your port is cleaned carefully with an antiseptic and allowed to dry. ? The port is gently pinched between sterile gloves, and a needle is inserted into the port.  Only "non-coring" port needles should be used to access the port. Once the port is accessed, a blood return should be checked. This helps ensure that the port  is in the vein and is not clogged.  If your port needs to remain accessed for a constant infusion, a clear (transparent) bandage will be placed over the needle site. The bandage and needle will need to be changed every week, or as directed by your health care provider.  Keep the bandage covering the needle clean and dry. Do not get it wet. Follow your health care provider's instructions on how to take a shower or bath while the port is accessed.  If your port does not need to stay accessed, no bandage is needed over the port.  What is flushing? Flushing helps keep the port from getting clogged. Follow your health care provider's instructions on how and when to flush the port. Ports are usually flushed with saline solution or a medicine called heparin. The need for flushing will depend on how the port is used.  If the port is used for intermittent medicines or blood draws, the port will need to be flushed: ? After medicines have been given. ? After blood has been drawn. ? As part of routine maintenance.  If a constant infusion is running, the port may not need to be flushed.  How long will my port stay implanted? The port can stay in for as long as your health care provider thinks it is needed. When it is time for the port to come out, surgery will be   done to remove it. The procedure is similar to the one performed when the port was put in. When should I seek immediate medical care? When you have an implanted port, you should seek immediate medical care if:  You notice a bad smell coming from the incision site.  You have swelling, redness, or drainage at the incision site.  You have more swelling or pain at the port site or the surrounding area.  You have a fever that is not controlled with medicine.  This information is not intended to replace advice given to you by your health care provider. Make sure you discuss any questions you have with your health care provider. Document  Released: 07/24/2005 Document Revised: 12/30/2015 Document Reviewed: 03/31/2013 Elsevier Interactive Patient Education  2017 [employer]  

## 2017-08-22 NOTE — Patient Instructions (Signed)

## 2017-08-24 ENCOUNTER — Inpatient Hospital Stay: Payer: Medicare Other

## 2017-08-24 VITALS — BP 133/87 | HR 84 | Temp 98.6°F | Resp 18

## 2017-08-24 DIAGNOSIS — Z5111 Encounter for antineoplastic chemotherapy: Secondary | ICD-10-CM | POA: Diagnosis not present

## 2017-08-24 DIAGNOSIS — J3489 Other specified disorders of nose and nasal sinuses: Secondary | ICD-10-CM | POA: Diagnosis not present

## 2017-08-24 DIAGNOSIS — R140 Abdominal distension (gaseous): Secondary | ICD-10-CM | POA: Diagnosis not present

## 2017-08-24 DIAGNOSIS — C187 Malignant neoplasm of sigmoid colon: Secondary | ICD-10-CM | POA: Diagnosis not present

## 2017-08-24 DIAGNOSIS — R5383 Other fatigue: Secondary | ICD-10-CM | POA: Diagnosis not present

## 2017-08-24 DIAGNOSIS — G629 Polyneuropathy, unspecified: Secondary | ICD-10-CM | POA: Diagnosis not present

## 2017-08-24 MED ORDER — SODIUM CHLORIDE 0.9% FLUSH
10.0000 mL | INTRAVENOUS | Status: DC | PRN
  Administered 2017-08-24: 10 mL
  Filled 2017-08-24: qty 10

## 2017-08-24 MED ORDER — HEPARIN SOD (PORK) LOCK FLUSH 100 UNIT/ML IV SOLN
500.0000 [IU] | Freq: Once | INTRAVENOUS | Status: AC | PRN
  Administered 2017-08-24: 500 [IU]
  Filled 2017-08-24: qty 5

## 2017-08-24 MED ORDER — HEPARIN SOD (PORK) LOCK FLUSH 100 UNIT/ML IV SOLN
250.0000 [IU] | Freq: Once | INTRAVENOUS | Status: DC | PRN
  Filled 2017-08-24: qty 5

## 2017-08-28 ENCOUNTER — Telehealth: Payer: Self-pay | Admitting: *Deleted

## 2017-08-28 ENCOUNTER — Other Ambulatory Visit: Payer: Self-pay | Admitting: Family

## 2017-08-28 ENCOUNTER — Inpatient Hospital Stay: Payer: Medicare Other

## 2017-08-28 ENCOUNTER — Other Ambulatory Visit: Payer: Self-pay

## 2017-08-28 VITALS — BP 130/88 | HR 83 | Temp 98.3°F | Resp 19

## 2017-08-28 DIAGNOSIS — E860 Dehydration: Secondary | ICD-10-CM

## 2017-08-28 DIAGNOSIS — R140 Abdominal distension (gaseous): Secondary | ICD-10-CM | POA: Diagnosis not present

## 2017-08-28 DIAGNOSIS — R5383 Other fatigue: Secondary | ICD-10-CM | POA: Diagnosis not present

## 2017-08-28 DIAGNOSIS — J3489 Other specified disorders of nose and nasal sinuses: Secondary | ICD-10-CM | POA: Diagnosis not present

## 2017-08-28 DIAGNOSIS — C187 Malignant neoplasm of sigmoid colon: Secondary | ICD-10-CM | POA: Diagnosis not present

## 2017-08-28 DIAGNOSIS — Z5111 Encounter for antineoplastic chemotherapy: Secondary | ICD-10-CM | POA: Diagnosis not present

## 2017-08-28 DIAGNOSIS — G629 Polyneuropathy, unspecified: Secondary | ICD-10-CM | POA: Diagnosis not present

## 2017-08-28 LAB — CBC WITH DIFFERENTIAL (CANCER CENTER ONLY)
Basophils Absolute: 0 10*3/uL (ref 0.0–0.1)
Basophils Relative: 1 %
EOS ABS: 0.1 10*3/uL (ref 0.0–0.5)
EOS PCT: 3 %
HCT: 30.9 % — ABNORMAL LOW (ref 38.7–49.9)
Hemoglobin: 11 g/dL — ABNORMAL LOW (ref 13.0–17.1)
LYMPHS ABS: 0.9 10*3/uL (ref 0.9–3.3)
LYMPHS PCT: 49 %
MCH: 31.6 pg (ref 28.0–33.4)
MCHC: 35.6 g/dL (ref 32.0–35.9)
MCV: 88.8 fL (ref 82.0–98.0)
MONO ABS: 0.3 10*3/uL (ref 0.1–0.9)
Monocytes Relative: 17 %
Neutro Abs: 0.6 10*3/uL — ABNORMAL LOW (ref 1.5–6.5)
Neutrophils Relative %: 30 %
PLATELETS: 87 10*3/uL — AB (ref 140–400)
RBC: 3.48 MIL/uL — ABNORMAL LOW (ref 4.20–5.70)
RDW: 14.8 % (ref 11.1–15.7)
WBC Count: 1.9 10*3/uL — ABNORMAL LOW (ref 4.0–10.3)

## 2017-08-28 LAB — CMP (CANCER CENTER ONLY)
ALBUMIN: 3.2 g/dL — AB (ref 3.5–5.0)
ALK PHOS: 73 U/L (ref 26–84)
ALT: 34 U/L (ref 0–55)
AST: 40 U/L — ABNORMAL HIGH (ref 5–34)
Anion gap: 6 (ref 5–15)
BUN: 18 mg/dL (ref 7–22)
CO2: 29 mmol/L (ref 18–33)
CREATININE: 1 mg/dL (ref 0.70–1.30)
Calcium: 8.4 mg/dL (ref 8.0–10.3)
Chloride: 106 mmol/L (ref 98–108)
GLUCOSE: 110 mg/dL (ref 70–118)
POTASSIUM: 3.5 mmol/L (ref 3.3–4.7)
SODIUM: 141 mmol/L (ref 128–145)
TOTAL PROTEIN: 5.7 g/dL — AB (ref 6.4–8.1)
Total Bilirubin: 1.4 mg/dL — ABNORMAL HIGH (ref 0.2–1.2)

## 2017-08-28 MED ORDER — SODIUM CHLORIDE 0.9% FLUSH
10.0000 mL | INTRAVENOUS | Status: DC | PRN
  Administered 2017-08-28: 10 mL via INTRAVENOUS
  Filled 2017-08-28: qty 10

## 2017-08-28 MED ORDER — SODIUM CHLORIDE 0.9 % IV SOLN
Freq: Once | INTRAVENOUS | Status: AC
  Administered 2017-08-28: 12:00:00 via INTRAVENOUS

## 2017-08-28 MED ORDER — HEPARIN SOD (PORK) LOCK FLUSH 100 UNIT/ML IV SOLN
500.0000 [IU] | Freq: Once | INTRAVENOUS | Status: AC
  Administered 2017-08-28: 500 [IU] via INTRAVENOUS
  Filled 2017-08-28: qty 5

## 2017-08-28 NOTE — Telephone Encounter (Signed)
Patient is c/o feeling weak and dehydrated. He would like to come in for some IVF.  Per Laverna Peace NP, this is okay. Patient scheduled and patient aware of appointment.

## 2017-08-28 NOTE — Patient Instructions (Signed)
Dehydration, Adult Dehydration is when there is not enough fluid or water in your body. This happens when you lose more fluids than you take in. Dehydration can range from mild to very bad. It should be treated right away to keep it from getting very bad. Symptoms of mild dehydration may include:  Thirst.  Dry lips.  Slightly dry mouth.  Dry, warm skin.  Dizziness. Symptoms of moderate dehydration may include:  Very dry mouth.  Muscle cramps.  Dark pee (urine). Pee may be the color of tea.  Your body making less pee.  Your eyes making fewer tears.  Heartbeat that is uneven or faster than normal (palpitations).  Headache.  Light-headedness, especially when you stand up from sitting.  Fainting (syncope). Symptoms of very bad dehydration may include:  Changes in skin, such as: ? Cold and clammy skin. ? Blotchy (mottled) or pale skin. ? Skin that does not quickly return to normal after being lightly pinched and let go (poor skin turgor).  Changes in body fluids, such as: ? Feeling very thirsty. ? Your eyes making fewer tears. ? Not sweating when body temperature is high, such as in hot weather. ? Your body making very little pee.  Changes in vital signs, such as: ? Weak pulse. ? Pulse that is more than 100 beats a minute when you are sitting still. ? Fast breathing. ? Low blood pressure.  Other changes, such as: ? Sunken eyes. ? Cold hands and feet. ? Confusion. ? Lack of energy (lethargy). ? Trouble waking up from sleep. ? Short-term weight loss. ? Unconsciousness. Follow these instructions at home:  If told by your doctor, drink an ORS: ? Make an ORS by using instructions on the package. ? Start by drinking small amounts, about  cup (120 mL) every 5-10 minutes. ? Slowly drink more until you have had the amount that your doctor said to have.  Drink enough clear fluid to keep your pee clear or pale yellow. If you were told to drink an ORS, finish the ORS  first, then start slowly drinking clear fluids. Drink fluids such as: ? Water. Do not drink only water by itself. Doing that can make the salt (sodium) level in your body get too low (hyponatremia). ? Ice chips. ? Fruit juice that you have added water to (diluted). ? Low-calorie sports drinks.  Avoid: ? Alcohol. ? Drinks that have a lot of sugar. These include high-calorie sports drinks, fruit juice that does not have water added, and soda. ? Caffeine. ? Foods that are greasy or have a lot of fat or sugar.  Take over-the-counter and prescription medicines only as told by your doctor.  Do not take salt tablets. Doing that can make the salt level in your body get too high (hypernatremia).  Eat foods that have minerals (electrolytes). Examples include bananas, oranges, potatoes, tomatoes, and spinach.  Keep all follow-up visits as told by your doctor. This is important. Contact a doctor if:  You have belly (abdominal) pain that: ? Gets worse. ? Stays in one area (localizes).  You have a rash.  You have a stiff neck.  You get angry or annoyed more easily than normal (irritability).  You are more sleepy than normal.  You have a harder time waking up than normal.  You feel: ? Weak. ? Dizzy. ? Very thirsty.  You have peed (urinated) only a small amount of very dark pee during 6-8 hours. Get help right away if:  You have symptoms of   very bad dehydration.  You cannot drink fluids without throwing up (vomiting).  Your symptoms get worse with treatment.  You have a fever.  You have a very bad headache.  You are throwing up or having watery poop (diarrhea) and it: ? Gets worse. ? Does not go away.  You have blood or something green (bile) in your throw-up.  You have blood in your poop (stool). This may cause poop to look black and tarry.  You have not peed in 6-8 hours.  You pass out (faint).  Your heart rate when you are sitting still is more than 100 beats a  minute.  You have trouble breathing. This information is not intended to replace advice given to you by your health care provider. Make sure you discuss any questions you have with your health care provider. Document Released: 05/20/2009 Document Revised: 02/11/2016 Document Reviewed: 09/17/2015 Elsevier Interactive Patient Education  2018 [employer]  

## 2017-09-05 ENCOUNTER — Inpatient Hospital Stay (HOSPITAL_BASED_OUTPATIENT_CLINIC_OR_DEPARTMENT_OTHER): Payer: Medicare Other | Admitting: Hematology & Oncology

## 2017-09-05 ENCOUNTER — Telehealth: Payer: Self-pay | Admitting: *Deleted

## 2017-09-05 ENCOUNTER — Inpatient Hospital Stay: Payer: Medicare Other

## 2017-09-05 ENCOUNTER — Other Ambulatory Visit: Payer: Self-pay

## 2017-09-05 DIAGNOSIS — C187 Malignant neoplasm of sigmoid colon: Secondary | ICD-10-CM

## 2017-09-05 DIAGNOSIS — Z5111 Encounter for antineoplastic chemotherapy: Secondary | ICD-10-CM | POA: Diagnosis not present

## 2017-09-05 DIAGNOSIS — Z79899 Other long term (current) drug therapy: Secondary | ICD-10-CM | POA: Diagnosis not present

## 2017-09-05 DIAGNOSIS — R140 Abdominal distension (gaseous): Secondary | ICD-10-CM | POA: Diagnosis not present

## 2017-09-05 DIAGNOSIS — R609 Edema, unspecified: Secondary | ICD-10-CM

## 2017-09-05 DIAGNOSIS — R5383 Other fatigue: Secondary | ICD-10-CM

## 2017-09-05 DIAGNOSIS — J3489 Other specified disorders of nose and nasal sinuses: Secondary | ICD-10-CM | POA: Diagnosis not present

## 2017-09-05 DIAGNOSIS — G629 Polyneuropathy, unspecified: Secondary | ICD-10-CM

## 2017-09-05 LAB — CMP (CANCER CENTER ONLY)
ALT: 33 U/L (ref 0–55)
ANION GAP: 4 — AB (ref 5–15)
AST: 42 U/L — ABNORMAL HIGH (ref 5–34)
Albumin: 3.1 g/dL — ABNORMAL LOW (ref 3.5–5.0)
Alkaline Phosphatase: 85 U/L — ABNORMAL HIGH (ref 26–84)
BUN: 11 mg/dL (ref 7–22)
CO2: 29 mmol/L (ref 18–33)
Calcium: 9.2 mg/dL (ref 8.0–10.3)
Chloride: 110 mmol/L — ABNORMAL HIGH (ref 98–108)
Creatinine: 0.9 mg/dL (ref 0.70–1.30)
GLUCOSE: 126 mg/dL — AB (ref 73–118)
POTASSIUM: 3.2 mmol/L — AB (ref 3.3–4.7)
Sodium: 143 mmol/L (ref 128–145)
Total Bilirubin: 1 mg/dL (ref 0.2–1.2)
Total Protein: 6.2 g/dL — ABNORMAL LOW (ref 6.4–8.1)

## 2017-09-05 LAB — CBC WITH DIFFERENTIAL (CANCER CENTER ONLY)
BASOS ABS: 0 10*3/uL (ref 0.0–0.1)
Basophils Relative: 2 %
EOS PCT: 1 %
Eosinophils Absolute: 0 10*3/uL (ref 0.0–0.5)
HCT: 31.8 % — ABNORMAL LOW (ref 38.7–49.9)
Hemoglobin: 11 g/dL — ABNORMAL LOW (ref 13.0–17.1)
LYMPHS ABS: 0.9 10*3/uL (ref 0.9–3.3)
LYMPHS PCT: 45 %
MCH: 31.1 pg (ref 28.0–33.4)
MCHC: 34.6 g/dL (ref 32.0–35.9)
MCV: 89.8 fL (ref 82.0–98.0)
MONO ABS: 0.5 10*3/uL (ref 0.1–0.9)
Monocytes Relative: 26 %
Neutro Abs: 0.5 10*3/uL — CL (ref 1.5–6.5)
Neutrophils Relative %: 26 %
PLATELETS: 74 10*3/uL — AB (ref 140–400)
RBC: 3.54 MIL/uL — ABNORMAL LOW (ref 4.20–5.70)
RDW: 15.4 % (ref 11.1–15.7)
WBC Count: 2 10*3/uL — ABNORMAL LOW (ref 4.0–10.3)

## 2017-09-05 LAB — LACTATE DEHYDROGENASE: LDH: 194 U/L (ref 125–245)

## 2017-09-05 LAB — CEA (IN HOUSE-CHCC): CEA (CHCC-In House): 2.13 ng/mL (ref 0.00–5.00)

## 2017-09-05 MED ORDER — DEXTROSE 5 % IV SOLN
Freq: Once | INTRAVENOUS | Status: AC
  Administered 2017-09-05: 12:00:00 via INTRAVENOUS

## 2017-09-05 MED ORDER — LEUCOVORIN CALCIUM INJECTION 350 MG
400.0000 mg/m2 | Freq: Once | INTRAVENOUS | Status: AC
  Administered 2017-09-05: 752 mg via INTRAVENOUS
  Filled 2017-09-05: qty 37.6

## 2017-09-05 MED ORDER — SODIUM CHLORIDE 0.9% FLUSH
10.0000 mL | INTRAVENOUS | Status: DC | PRN
  Filled 2017-09-05: qty 10

## 2017-09-05 MED ORDER — HEPARIN SOD (PORK) LOCK FLUSH 100 UNIT/ML IV SOLN
500.0000 [IU] | Freq: Once | INTRAVENOUS | Status: DC | PRN
  Filled 2017-09-05: qty 5

## 2017-09-05 MED ORDER — DEXAMETHASONE SODIUM PHOSPHATE 10 MG/ML IJ SOLN
10.0000 mg | Freq: Once | INTRAMUSCULAR | Status: AC
  Administered 2017-09-05: 10 mg via INTRAVENOUS

## 2017-09-05 MED ORDER — DEXAMETHASONE SODIUM PHOSPHATE 10 MG/ML IJ SOLN
INTRAMUSCULAR | Status: AC
  Filled 2017-09-05: qty 1

## 2017-09-05 MED ORDER — FLUOROURACIL CHEMO INJECTION 2.5 GM/50ML
400.0000 mg/m2 | Freq: Once | INTRAVENOUS | Status: AC
  Administered 2017-09-05: 750 mg via INTRAVENOUS
  Filled 2017-09-05: qty 15

## 2017-09-05 MED ORDER — SODIUM CHLORIDE 0.9 % IV SOLN
2400.0000 mg/m2 | INTRAVENOUS | Status: DC
  Administered 2017-09-05: 4500 mg via INTRAVENOUS
  Filled 2017-09-05: qty 90

## 2017-09-05 NOTE — Patient Instructions (Signed)
[facility] Discharge Instructions for Patients Receiving Chemotherapy  Today you received the following chemotherapy agents Leucovorin, 5FU.  To help prevent nausea and vomiting after your treatment, we encourage you to take your nausea medication as perscribed by your MD.   If you develop nausea and vomiting that is not controlled by your nausea medication, call the clinic.   BELOW ARE SYMPTOMS THAT SHOULD BE REPORTED IMMEDIATELY:  *FEVER GREATER THAN 100.5 F  *CHILLS WITH OR WITHOUT FEVER  NAUSEA AND VOMITING THAT IS NOT CONTROLLED WITH YOUR NAUSEA MEDICATION  *UNUSUAL SHORTNESS OF BREATH  *UNUSUAL BRUISING OR BLEEDING  TENDERNESS IN MOUTH AND THROAT WITH OR WITHOUT PRESENCE OF ULCERS  *URINARY PROBLEMS  *BOWEL PROBLEMS  UNUSUAL RASH Items with * indicate a potential emergency and should be followed up as soon as possible.  Feel free to call the clinic should you have any questions or concerns. The clinic phone number is [phone].  Please show the [place] at check-in to the Emergency Department and triage nurse.

## 2017-09-05 NOTE — Telephone Encounter (Signed)
Critical Value ANC 0.5 Dr Marin Olp notified. No orders at this time

## 2017-09-05 NOTE — Progress Notes (Signed)
OK to treat with today's lab values per Dr. Ennever. 

## 2017-09-05 NOTE — Patient Instructions (Signed)

## 2017-09-05 NOTE — Progress Notes (Signed)
Hematology and Oncology Follow Up Visit  Jack Gray [id number] 11/07/1945 72 y.o. 09/05/2017   Principle Diagnosis:   Stage IIIB (T3N1cM0) adenocarcinoma of the sigmoid colon  Current Therapy:    Status post cycle #5 of adjuvant FOLFOX (oxaliplatin d/c'ed w/       cycle #6)     Interim History:  Jack Gray is back for follow-up.  I believe that he is starting to show the effects of oxaliplatin.  He is having some more in the way of neuropathy.  He has more tingling in the hands and feet.  He says he has more sensitive to the cold.  Now that we have this Arctic blast of cold weather, the neuropathy is a little bit worse.  He has more bloating.  He is having some loose stools.  He says that his rectal flatulence is very powerful.  We will go ahead and omit the oxaliplatin beginning with this sixth cycle.  I think this will help him.  His blood counts are little bit on the lower side.  He is had no mucositis.  He has had no blurred vision.  He has had some rhinorrhea which probably is from the 5-FU.  His last CEA level was normal at 2.27.  He does seem to be eating okay.  He has had some nausea but no vomiting.  He has had no leg swelling.  He has had no rashes.  Overall, his performance status is ECOG 1.  Medications:  Current Outpatient Medications:  .  atorvastatin (LIPITOR) 20 MG tablet, Take 20 mg by mouth daily at 6 PM., Disp: , Rfl:  .  b complex vitamins tablet, Take 1 tablet by mouth daily., Disp: , Rfl:  .  Cholecalciferol (VITAMIN D) 2000 units CAPS, Take 2,000 Units by mouth daily., Disp: , Rfl:  .  dexamethasone (DECADRON) 4 MG tablet, Take 2 tablets (8 mg total) daily by mouth. Start the day after chemotherapy for 2 days. Take with food., Disp: 30 tablet, Rfl: 1 .  lidocaine-prilocaine (EMLA) cream, Applying to [facility] site 1 hour prior to chemotherapy appointment.  Place a plastic wrap on top of the cream to keep this against the skin, Disp: 30 g, Rfl: 4 .  LORazepam  (ATIVAN) 0.5 MG tablet, Take 1 tablet (0.5 mg total) every 6 (six) hours as needed by mouth (Nausea or vomiting)., Disp: 30 tablet, Rfl: 0 .  Multiple Vitamins-Minerals (PRESERVISION AREDS PO), Take 1 tablet by mouth 2 (two) times daily., Disp: , Rfl:  .  ondansetron (ZOFRAN) 8 MG tablet, Take 1 tablet (8 mg total) 2 (two) times daily as needed by mouth for refractory nausea / vomiting. Start on day 3 after chemotherapy., Disp: 30 tablet, Rfl: 1 .  prochlorperazine (COMPAZINE) 10 MG tablet, Take 1 tablet (10 mg total) every 6 (six) hours as needed by mouth (Nausea or vomiting)., Disp: 30 tablet, Rfl: 1  Allergies: No Known Allergies  Past Medical History, Surgical history, Social history, and Family History were reviewed and updated.  Review of Systems: Review of Systems  Constitutional: Negative for appetite change, fatigue, fever and unexpected weight change.  HENT:   Negative for lump/mass, mouth sores, sore throat and trouble swallowing.   Respiratory: Negative for cough, hemoptysis and shortness of breath.   Cardiovascular: Negative for leg swelling and palpitations.  Gastrointestinal: Negative for abdominal distention, abdominal pain, blood in stool, constipation, diarrhea, nausea and vomiting.  Genitourinary: Negative for bladder incontinence, dysuria, frequency and hematuria.  Musculoskeletal: Negative for arthralgias, back pain, gait problem and myalgias.  Skin: Negative for itching and rash.  Neurological: Negative for dizziness, extremity weakness, gait problem, headaches, numbness, seizures and speech difficulty.  Hematological: Does not bruise/bleed easily.  Psychiatric/Behavioral: Negative for depression and sleep disturbance. The patient is not nervous/anxious.     Physical Exam:  vitals were not taken for this visit.   Wt Readings from Last 3 Encounters:  09/05/17 167 lb (75.8 kg)  08/22/17 167 lb 0.6 oz (75.8 kg)  08/08/17 166 lb 8 oz (75.5 kg)    Physical Exam    Constitutional: He is oriented to person, place, and time.  HENT:  Head: Normocephalic and atraumatic.  Mouth/Throat: Oropharynx is clear and moist.  Eyes: EOM are normal. Pupils are equal, round, and reactive to light.  Neck: Normal range of motion.  Cardiovascular: Normal rate, regular rhythm and normal heart sounds.  Pulmonary/Chest: Effort normal and breath sounds normal.  Abdominal: Soft. Bowel sounds are normal.  Musculoskeletal: Normal range of motion. He exhibits no edema, tenderness or deformity.  Lymphadenopathy:    He has no cervical adenopathy.  Neurological: He is alert and oriented to person, place, and time.  Skin: Skin is warm and dry. No rash noted. No erythema.  Psychiatric: He has a normal mood and affect. His behavior is normal. Judgment and thought content normal.  Vitals reviewed.    Lab Results  Component Value Date   WBC 2.0 (L) 09/05/2017   HGB 12.1 (L) 08/08/2017   HCT 31.8 (L) 09/05/2017   MCV 89.8 09/05/2017   PLT 74 (L) 09/05/2017     Chemistry      Component Value Date/Time   NA 143 09/05/2017 1045   NA 141 08/08/2017 0802   K 3.2 (L) 09/05/2017 1045   K 3.7 08/08/2017 0802   CL 110 (H) 09/05/2017 1045   CL 105 08/08/2017 0802   CO2 29 09/05/2017 1045   CO2 29 08/08/2017 0802   BUN 11 09/05/2017 1045   BUN 15 08/08/2017 0802   CREATININE 1.1 08/08/2017 0802      Component Value Date/Time   CALCIUM 9.2 09/05/2017 1045   CALCIUM 9.2 08/08/2017 0802   ALKPHOS 85 (H) 09/05/2017 1045   ALKPHOS 86 (H) 08/08/2017 0802   AST 42 (H) 09/05/2017 1045   ALT 33 09/05/2017 1045   ALT 64 (H) 08/08/2017 0802   BILITOT 1.0 09/05/2017 1045       Impression and Plan: Jack Gray is a 72 year old white male.  He has a stage IIIb adenocarcinoma of the sigmoid colon.  He had this resected.  Even though he had negative lymph nodes, he had 2 nodules which may have been lymph nodes.  This will be cycle 6/8.  Again, we will omit the oxaliplatin.  I  realize that his blood counts are a little bit low.  I think that we can continue to push ahead with treatment.  I really want to get done in February.  We will have him come back in 2 weeks for his seventh cycle of treatment.   Volanda Napoleon, MD 1/30/201911:43 AM

## 2017-09-07 ENCOUNTER — Inpatient Hospital Stay: Payer: Medicare Other | Attending: Hematology & Oncology

## 2017-09-07 VITALS — BP 126/77 | HR 71 | Temp 97.9°F | Resp 17

## 2017-09-07 DIAGNOSIS — Z5111 Encounter for antineoplastic chemotherapy: Secondary | ICD-10-CM | POA: Insufficient documentation

## 2017-09-07 DIAGNOSIS — G629 Polyneuropathy, unspecified: Secondary | ICD-10-CM | POA: Diagnosis not present

## 2017-09-07 DIAGNOSIS — C187 Malignant neoplasm of sigmoid colon: Secondary | ICD-10-CM

## 2017-09-07 DIAGNOSIS — Z79899 Other long term (current) drug therapy: Secondary | ICD-10-CM | POA: Diagnosis not present

## 2017-09-07 MED ORDER — SODIUM CHLORIDE 0.9% FLUSH
10.0000 mL | INTRAVENOUS | Status: DC | PRN
  Administered 2017-09-07: 10 mL
  Filled 2017-09-07: qty 10

## 2017-09-07 MED ORDER — HEPARIN SOD (PORK) LOCK FLUSH 100 UNIT/ML IV SOLN
500.0000 [IU] | Freq: Once | INTRAVENOUS | Status: AC | PRN
  Administered 2017-09-07: 500 [IU]
  Filled 2017-09-07: qty 5

## 2017-09-19 ENCOUNTER — Inpatient Hospital Stay: Payer: Medicare Other

## 2017-09-19 ENCOUNTER — Inpatient Hospital Stay (HOSPITAL_BASED_OUTPATIENT_CLINIC_OR_DEPARTMENT_OTHER): Payer: Medicare Other | Admitting: Hematology & Oncology

## 2017-09-19 ENCOUNTER — Encounter: Payer: Self-pay | Admitting: Hematology & Oncology

## 2017-09-19 ENCOUNTER — Other Ambulatory Visit: Payer: Self-pay

## 2017-09-19 VITALS — BP 126/7 | HR 90 | Temp 98.1°F | Resp 16 | Wt 166.0 lb

## 2017-09-19 DIAGNOSIS — G629 Polyneuropathy, unspecified: Secondary | ICD-10-CM | POA: Diagnosis not present

## 2017-09-19 DIAGNOSIS — C187 Malignant neoplasm of sigmoid colon: Secondary | ICD-10-CM

## 2017-09-19 DIAGNOSIS — Z79899 Other long term (current) drug therapy: Secondary | ICD-10-CM | POA: Diagnosis not present

## 2017-09-19 DIAGNOSIS — Z5111 Encounter for antineoplastic chemotherapy: Secondary | ICD-10-CM

## 2017-09-19 LAB — CMP (CANCER CENTER ONLY)
ALBUMIN: 3.3 g/dL — AB (ref 3.5–5.0)
ALT: 34 U/L (ref 10–47)
ANION GAP: 8 (ref 5–15)
AST: 41 U/L — ABNORMAL HIGH (ref 11–38)
Alkaline Phosphatase: 82 U/L (ref 26–84)
BILIRUBIN TOTAL: 1.3 mg/dL (ref 0.2–1.6)
BUN: 13 mg/dL (ref 7–22)
CHLORIDE: 107 mmol/L (ref 98–108)
CO2: 29 mmol/L (ref 18–33)
Calcium: 9.3 mg/dL (ref 8.0–10.3)
Creatinine: 1.1 mg/dL (ref 0.60–1.20)
GLUCOSE: 116 mg/dL (ref 73–118)
Potassium: 3.3 mmol/L (ref 3.3–4.7)
Sodium: 144 mmol/L (ref 128–145)
Total Protein: 6.4 g/dL (ref 6.4–8.1)

## 2017-09-19 LAB — CBC WITH DIFFERENTIAL (CANCER CENTER ONLY)
BASOS ABS: 0 10*3/uL (ref 0.0–0.1)
BASOS PCT: 1 %
EOS PCT: 1 %
Eosinophils Absolute: 0 10*3/uL (ref 0.0–0.5)
HEMATOCRIT: 33.5 % — AB (ref 38.7–49.9)
Hemoglobin: 11.6 g/dL — ABNORMAL LOW (ref 13.0–17.1)
Lymphocytes Relative: 32 %
Lymphs Abs: 1.3 10*3/uL (ref 0.9–3.3)
MCH: 31.7 pg (ref 28.0–33.4)
MCHC: 34.6 g/dL (ref 32.0–35.9)
MCV: 91.5 fL (ref 82.0–98.0)
MONO ABS: 0.5 10*3/uL (ref 0.1–0.9)
MONOS PCT: 13 %
Neutro Abs: 2 10*3/uL (ref 1.5–6.5)
Neutrophils Relative %: 53 %
PLATELETS: 90 10*3/uL — AB (ref 145–400)
RBC: 3.66 MIL/uL — ABNORMAL LOW (ref 4.20–5.70)
RDW: 16 % — AB (ref 11.1–15.7)
WBC Count: 3.9 10*3/uL — ABNORMAL LOW (ref 4.0–10.0)

## 2017-09-19 LAB — CEA (IN HOUSE-CHCC): CEA (CHCC-In House): 2.33 ng/mL (ref 0.00–5.00)

## 2017-09-19 MED ORDER — HEPARIN SOD (PORK) LOCK FLUSH 100 UNIT/ML IV SOLN
500.0000 [IU] | Freq: Once | INTRAVENOUS | Status: DC | PRN
  Filled 2017-09-19: qty 5

## 2017-09-19 MED ORDER — FLUOROURACIL CHEMO INJECTION 2.5 GM/50ML
400.0000 mg/m2 | Freq: Once | INTRAVENOUS | Status: AC
  Administered 2017-09-19: 750 mg via INTRAVENOUS
  Filled 2017-09-19: qty 15

## 2017-09-19 MED ORDER — LEUCOVORIN CALCIUM INJECTION 350 MG
400.0000 mg/m2 | Freq: Once | INTRAVENOUS | Status: AC
  Administered 2017-09-19: 752 mg via INTRAVENOUS
  Filled 2017-09-19: qty 37.6

## 2017-09-19 MED ORDER — SODIUM CHLORIDE 0.9% FLUSH
10.0000 mL | INTRAVENOUS | Status: DC | PRN
  Filled 2017-09-19: qty 10

## 2017-09-19 MED ORDER — FLUOROURACIL CHEMO INJECTION 5 GM/100ML
2400.0000 mg/m2 | INTRAVENOUS | Status: DC
  Administered 2017-09-19: 4500 mg via INTRAVENOUS
  Filled 2017-09-19: qty 90

## 2017-09-19 MED ORDER — DEXTROSE 5 % IV SOLN
Freq: Once | INTRAVENOUS | Status: AC
  Administered 2017-09-19: 12:00:00 via INTRAVENOUS

## 2017-09-19 MED ORDER — DEXAMETHASONE SODIUM PHOSPHATE 10 MG/ML IJ SOLN
10.0000 mg | Freq: Once | INTRAMUSCULAR | Status: AC
  Administered 2017-09-19: 10 mg via INTRAVENOUS

## 2017-09-19 MED ORDER — HOT PACK MISC ONCOLOGY
1.0000 | Freq: Once | Status: DC | PRN
  Filled 2017-09-19: qty 1

## 2017-09-19 MED ORDER — DEXAMETHASONE SODIUM PHOSPHATE 10 MG/ML IJ SOLN
INTRAMUSCULAR | Status: AC
  Filled 2017-09-19: qty 1

## 2017-09-19 MED ORDER — COLD PACK MISC ONCOLOGY
1.0000 | Freq: Once | Status: DC | PRN
  Filled 2017-09-19: qty 1

## 2017-09-19 NOTE — Progress Notes (Signed)
Hematology and Oncology Follow Up Visit  Jack Gray [id number] February 17, 1946 72 y.o. 09/19/2017   Principle Diagnosis:   Stage IIIB (T3N1cM0) adenocarcinoma of the sigmoid colon  Current Therapy:    Status post cycle #6 of adjuvant FOLFOX (oxaliplatin d/c'ed w/  cycle #6)     Interim History:  Jack Gray is back for follow-up.  He is doing well.  He did better when we stopped the oxaliplatin.  He could eat a little bit better.  He did not have as much in the way of tingling in the hands or feet.  His last CEA level was 2.13.  He has lost a little bit of hair.  Hopefully, this will improve now that the oxaliplatin has been discontinued.  He has had no problems with cough or shortness of breath.  He has had no leg swelling.  He has had no mouth sores.  Overall, his performance status is ECOG 1.   Medications:  Current Outpatient Medications:  .  atorvastatin (LIPITOR) 20 MG tablet, Take 20 mg by mouth daily at 6 PM., Disp: , Rfl:  .  b complex vitamins tablet, Take 1 tablet by mouth daily., Disp: , Rfl:  .  Cholecalciferol (VITAMIN D) 2000 units CAPS, Take 2,000 Units by mouth daily., Disp: , Rfl:  .  dexamethasone (DECADRON) 4 MG tablet, Take 2 tablets (8 mg total) daily by mouth. Start the day after chemotherapy for 2 days. Take with food., Disp: 30 tablet, Rfl: 1 .  lidocaine-prilocaine (EMLA) cream, Applying to [facility] site 1 hour prior to chemotherapy appointment.  Place a plastic wrap on top of the cream to keep this against the skin, Disp: 30 g, Rfl: 4 .  LORazepam (ATIVAN) 0.5 MG tablet, Take 1 tablet (0.5 mg total) every 6 (six) hours as needed by mouth (Nausea or vomiting)., Disp: 30 tablet, Rfl: 0 .  Multiple Vitamins-Minerals (PRESERVISION AREDS PO), Take 1 tablet by mouth 2 (two) times daily., Disp: , Rfl:  .  omeprazole (PRILOSEC) 20 MG capsule, Take 20 mg by mouth daily., Disp: , Rfl:  .  ondansetron (ZOFRAN) 8 MG tablet, Take 1 tablet (8 mg total) 2 (two) times daily as  needed by mouth for refractory nausea / vomiting. Start on day 3 after chemotherapy., Disp: 30 tablet, Rfl: 1 .  prochlorperazine (COMPAZINE) 10 MG tablet, Take 1 tablet (10 mg total) every 6 (six) hours as needed by mouth (Nausea or vomiting)., Disp: 30 tablet, Rfl: 1  Allergies: No Known Allergies  Past Medical History, Surgical history, Social history, and Family History were reviewed and updated.  Review of Systems: Review of Systems  Constitutional: Negative for appetite change, fatigue, fever and unexpected weight change.  HENT:   Negative for lump/mass, mouth sores, sore throat and trouble swallowing.   Respiratory: Negative for cough, hemoptysis and shortness of breath.   Cardiovascular: Negative for leg swelling and palpitations.  Gastrointestinal: Negative for abdominal distention, abdominal pain, blood in stool, constipation, diarrhea, nausea and vomiting.  Genitourinary: Negative for bladder incontinence, dysuria, frequency and hematuria.   Musculoskeletal: Negative for arthralgias, back pain, gait problem and myalgias.  Skin: Negative for itching and rash.  Neurological: Negative for dizziness, extremity weakness, gait problem, headaches, numbness, seizures and speech difficulty.  Hematological: Does not bruise/bleed easily.  Psychiatric/Behavioral: Negative for depression and sleep disturbance. The patient is not nervous/anxious.     Physical Exam:  weight is 166 lb (75.3 kg). His oral temperature is 98.1 F (36.7 C). His blood  pressure is 126/7 (abnormal) and his pulse is 90. His respiration is 16 and oxygen saturation is 95%.   Wt Readings from Last 3 Encounters:  09/19/17 166 lb (75.3 kg)  09/05/17 167 lb (75.8 kg)  08/22/17 167 lb 0.6 oz (75.8 kg)    Physical Exam  Constitutional: He is oriented to person, place, and time.  HENT:  Head: Normocephalic and atraumatic.  Mouth/Throat: Oropharynx is clear and moist.  Eyes: EOM are normal. Pupils are equal, round, and  reactive to light.  Neck: Normal range of motion.  Cardiovascular: Normal rate, regular rhythm and normal heart sounds.  Pulmonary/Chest: Effort normal and breath sounds normal.  Abdominal: Soft. Bowel sounds are normal.  Musculoskeletal: Normal range of motion. He exhibits no edema, tenderness or deformity.  Lymphadenopathy:    He has no cervical adenopathy.  Neurological: He is alert and oriented to person, place, and time.  Skin: Skin is warm and dry. No rash noted. No erythema.  Psychiatric: He has a normal mood and affect. His behavior is normal. Judgment and thought content normal.  Vitals reviewed.    Lab Results  Component Value Date   WBC 3.9 (L) 09/19/2017   HGB 12.1 (L) 08/08/2017   HCT 33.5 (L) 09/19/2017   MCV 91.5 09/19/2017   PLT 90 (L) 09/19/2017     Chemistry      Component Value Date/Time   NA 144 09/19/2017 0935   NA 141 08/08/2017 0802   K 3.3 09/19/2017 0935   K 3.7 08/08/2017 0802   CL 107 09/19/2017 0935   CL 105 08/08/2017 0802   CO2 29 09/19/2017 0935   CO2 29 08/08/2017 0802   BUN 13 09/19/2017 0935   BUN 15 08/08/2017 0802   CREATININE 1.10 09/19/2017 0935   CREATININE 1.1 08/08/2017 0802      Component Value Date/Time   CALCIUM 9.3 09/19/2017 0935   CALCIUM 9.2 08/08/2017 0802   ALKPHOS 82 09/19/2017 0935   ALKPHOS 86 (H) 08/08/2017 0802   AST 41 (H) 09/19/2017 0935   ALT 34 09/19/2017 0935   ALT 64 (H) 08/08/2017 0802   BILITOT 1.3 09/19/2017 0935       Impression and Plan: Jack Gray is a 72 year old white male.  He has a stage IIIb adenocarcinoma of the sigmoid colon.  He had this resected.  Even though he had negative lymph nodes, he had 2 nodules which may have been lymph nodes.  This will be cycle 7/8.  Again, we will omit the oxaliplatin.  His blood count is better.  His platelet count is better.  After his eighth cycle, we will do a baseline CT scan on him.  I will get him back in 2 weeks for his eighth and final cycle of  treatment.    Volanda Napoleon, MD 2/13/201911:52 AM

## 2017-09-19 NOTE — Patient Instructions (Signed)
[facility] Discharge Instructions for Patients Receiving Chemotherapy  Today you received the following chemotherapy agents Leucovorin, Fluoracil.  To help prevent nausea and vomiting after your treatment, we encourage you to take your nausea medication as prscribed by your MD.   If you develop nausea and vomiting that is not controlled by your nausea medication, call the clinic.   BELOW ARE SYMPTOMS THAT SHOULD BE REPORTED IMMEDIATELY:  *FEVER GREATER THAN 100.5 F  *CHILLS WITH OR WITHOUT FEVER  NAUSEA AND VOMITING THAT IS NOT CONTROLLED WITH YOUR NAUSEA MEDICATION  *UNUSUAL SHORTNESS OF BREATH  *UNUSUAL BRUISING OR BLEEDING  TENDERNESS IN MOUTH AND THROAT WITH OR WITHOUT PRESENCE OF ULCERS  *URINARY PROBLEMS  *BOWEL PROBLEMS  UNUSUAL RASH Items with * indicate a potential emergency and should be followed up as soon as possible.  Feel free to call the clinic should you have any questions or concerns. The clinic phone number is [phone].  Please show the [facility] at check-in to the Emergency Department and triage nurse.

## 2017-09-19 NOTE — Progress Notes (Signed)
OK to treat with today's lab results per Dr. Marin Olp.

## 2017-09-21 ENCOUNTER — Inpatient Hospital Stay: Payer: Medicare Other

## 2017-09-21 VITALS — BP 119/73 | HR 95 | Temp 98.8°F | Resp 18

## 2017-09-21 DIAGNOSIS — Z5111 Encounter for antineoplastic chemotherapy: Secondary | ICD-10-CM | POA: Diagnosis not present

## 2017-09-21 DIAGNOSIS — G629 Polyneuropathy, unspecified: Secondary | ICD-10-CM | POA: Diagnosis not present

## 2017-09-21 DIAGNOSIS — C187 Malignant neoplasm of sigmoid colon: Secondary | ICD-10-CM

## 2017-09-21 DIAGNOSIS — Z79899 Other long term (current) drug therapy: Secondary | ICD-10-CM | POA: Diagnosis not present

## 2017-09-21 MED ORDER — SODIUM CHLORIDE 0.9% FLUSH
10.0000 mL | INTRAVENOUS | Status: DC | PRN
  Administered 2017-09-21: 10 mL
  Filled 2017-09-21: qty 10

## 2017-09-21 MED ORDER — HEPARIN SOD (PORK) LOCK FLUSH 100 UNIT/ML IV SOLN
500.0000 [IU] | Freq: Once | INTRAVENOUS | Status: AC | PRN
  Administered 2017-09-21: 500 [IU]
  Filled 2017-09-21: qty 5

## 2017-10-03 ENCOUNTER — Inpatient Hospital Stay: Payer: Medicare Other

## 2017-10-03 ENCOUNTER — Inpatient Hospital Stay (HOSPITAL_BASED_OUTPATIENT_CLINIC_OR_DEPARTMENT_OTHER): Payer: Medicare Other | Admitting: Hematology & Oncology

## 2017-10-03 DIAGNOSIS — C187 Malignant neoplasm of sigmoid colon: Secondary | ICD-10-CM

## 2017-10-03 DIAGNOSIS — Z5111 Encounter for antineoplastic chemotherapy: Secondary | ICD-10-CM | POA: Diagnosis not present

## 2017-10-03 DIAGNOSIS — G629 Polyneuropathy, unspecified: Secondary | ICD-10-CM

## 2017-10-03 DIAGNOSIS — Z79899 Other long term (current) drug therapy: Secondary | ICD-10-CM

## 2017-10-03 LAB — CMP (CANCER CENTER ONLY)
ALBUMIN: 3.4 g/dL — AB (ref 3.5–5.0)
ALT: 32 U/L (ref 10–47)
ANION GAP: 6 (ref 5–15)
AST: 40 U/L — ABNORMAL HIGH (ref 11–38)
Alkaline Phosphatase: 83 U/L (ref 26–84)
BILIRUBIN TOTAL: 1.4 mg/dL (ref 0.2–1.6)
BUN: 13 mg/dL (ref 7–22)
CALCIUM: 9.4 mg/dL (ref 8.0–10.3)
CO2: 28 mmol/L (ref 18–33)
CREATININE: 1.2 mg/dL (ref 0.60–1.20)
Chloride: 109 mmol/L — ABNORMAL HIGH (ref 98–108)
GLUCOSE: 126 mg/dL — AB (ref 73–118)
Potassium: 3.4 mmol/L (ref 3.3–4.7)
Sodium: 143 mmol/L (ref 128–145)
TOTAL PROTEIN: 6.4 g/dL (ref 6.4–8.1)

## 2017-10-03 LAB — CBC WITH DIFFERENTIAL (CANCER CENTER ONLY)
BASOS PCT: 1 %
Basophils Absolute: 0 10*3/uL (ref 0.0–0.1)
EOS ABS: 0.1 10*3/uL (ref 0.0–0.5)
EOS PCT: 1 %
HCT: 32.5 % — ABNORMAL LOW (ref 38.7–49.9)
Hemoglobin: 11.3 g/dL — ABNORMAL LOW (ref 13.0–17.1)
LYMPHS ABS: 1.4 10*3/uL (ref 0.9–3.3)
Lymphocytes Relative: 29 %
MCH: 32.3 pg (ref 28.0–33.4)
MCHC: 34.8 g/dL (ref 32.0–35.9)
MCV: 92.9 fL (ref 82.0–98.0)
MONOS PCT: 14 %
Monocytes Absolute: 0.7 10*3/uL (ref 0.1–0.9)
Neutro Abs: 2.7 10*3/uL (ref 1.5–6.5)
Neutrophils Relative %: 55 %
PLATELETS: 77 10*3/uL — AB (ref 145–400)
RBC: 3.5 MIL/uL — ABNORMAL LOW (ref 4.20–5.70)
RDW: 15.9 % — ABNORMAL HIGH (ref 11.1–15.7)
WBC: 4.8 10*3/uL (ref 4.0–10.0)

## 2017-10-03 LAB — CEA (IN HOUSE-CHCC): CEA (CHCC-In House): 2.2 ng/mL (ref 0.00–5.00)

## 2017-10-03 LAB — LACTATE DEHYDROGENASE: LDH: 217 U/L (ref 125–245)

## 2017-10-03 MED ORDER — SODIUM CHLORIDE 0.9 % IV SOLN
2400.0000 mg/m2 | INTRAVENOUS | Status: DC
  Administered 2017-10-03: 4500 mg via INTRAVENOUS
  Filled 2017-10-03: qty 90

## 2017-10-03 MED ORDER — DEXAMETHASONE SODIUM PHOSPHATE 10 MG/ML IJ SOLN
10.0000 mg | Freq: Once | INTRAMUSCULAR | Status: AC
  Administered 2017-10-03: 10 mg via INTRAVENOUS

## 2017-10-03 MED ORDER — FLUOROURACIL CHEMO INJECTION 2.5 GM/50ML
400.0000 mg/m2 | Freq: Once | INTRAVENOUS | Status: AC
  Administered 2017-10-03: 750 mg via INTRAVENOUS
  Filled 2017-10-03: qty 15

## 2017-10-03 MED ORDER — DEXTROSE 5 % IV SOLN
400.0000 mg/m2 | Freq: Once | INTRAVENOUS | Status: AC
  Administered 2017-10-03: 752 mg via INTRAVENOUS
  Filled 2017-10-03: qty 37.6

## 2017-10-03 MED ORDER — DEXAMETHASONE SODIUM PHOSPHATE 10 MG/ML IJ SOLN
INTRAMUSCULAR | Status: AC
Start: 2017-10-03 — End: ?
  Filled 2017-10-03: qty 1

## 2017-10-03 NOTE — Progress Notes (Signed)
Hematology and Oncology Follow Up Visit  CHADLEY DZIEDZIC [id number] 09-21-1945 72 y.o. 10/03/2017   Principle Diagnosis:   Stage IIIB (T3N1cM0) adenocarcinoma of the sigmoid colon  Current Therapy:    Status post cycle #7 of adjuvant FOLFOX (oxaliplatin d/c'ed w/  cycle #6)     Interim History:  Mr. Victory is back for follow-up.  He is doing okay.  He still has issues with neuropathy.  We dropped the oxaliplatin.  He has had no problems with nausea or vomiting.  He has had no cough.  Is had some fatigue.  He is not playing golf right now.  He has had no bleeding.  He has had no diarrhea.  He has had no rashes.  He has had no leg swelling.  His last CEA was 2.33.  He is still doing work.  Overall, his performance status is ECOG 1.   Medications:  Current Outpatient Medications:  .  atorvastatin (LIPITOR) 20 MG tablet, Take 20 mg by mouth daily at 6 PM., Disp: , Rfl:  .  b complex vitamins tablet, Take 1 tablet by mouth daily., Disp: , Rfl:  .  Cholecalciferol (VITAMIN D) 2000 units CAPS, Take 2,000 Units by mouth daily., Disp: , Rfl:  .  dexamethasone (DECADRON) 4 MG tablet, Take 2 tablets (8 mg total) daily by mouth. Start the day after chemotherapy for 2 days. Take with food., Disp: 30 tablet, Rfl: 1 .  lidocaine-prilocaine (EMLA) cream, Applying to [facility] site 1 hour prior to chemotherapy appointment.  Place a plastic wrap on top of the cream to keep this against the skin, Disp: 30 g, Rfl: 4 .  LORazepam (ATIVAN) 0.5 MG tablet, Take 1 tablet (0.5 mg total) every 6 (six) hours as needed by mouth (Nausea or vomiting)., Disp: 30 tablet, Rfl: 0 .  Multiple Vitamins-Minerals (PRESERVISION AREDS PO), Take 1 tablet by mouth 2 (two) times daily., Disp: , Rfl:  .  omeprazole (PRILOSEC) 20 MG capsule, Take 20 mg by mouth daily., Disp: , Rfl:  .  ondansetron (ZOFRAN) 8 MG tablet, Take 1 tablet (8 mg total) 2 (two) times daily as needed by mouth for refractory nausea / vomiting. Start on day 3  after chemotherapy., Disp: 30 tablet, Rfl: 1 .  prochlorperazine (COMPAZINE) 10 MG tablet, Take 1 tablet (10 mg total) every 6 (six) hours as needed by mouth (Nausea or vomiting)., Disp: 30 tablet, Rfl: 1  Allergies: No Known Allergies  Past Medical History, Surgical history, Social history, and Family History were reviewed and updated.  Review of Systems: Review of Systems  Constitutional: Negative for appetite change, fatigue, fever and unexpected weight change.  HENT:   Negative for lump/mass, mouth sores, sore throat and trouble swallowing.   Respiratory: Negative for cough, hemoptysis and shortness of breath.   Cardiovascular: Negative for leg swelling and palpitations.  Gastrointestinal: Negative for abdominal distention, abdominal pain, blood in stool, constipation, diarrhea, nausea and vomiting.  Genitourinary: Negative for bladder incontinence, dysuria, frequency and hematuria.   Musculoskeletal: Negative for arthralgias, back pain, gait problem and myalgias.  Skin: Negative for itching and rash.  Neurological: Negative for dizziness, extremity weakness, gait problem, headaches, numbness, seizures and speech difficulty.  Hematological: Does not bruise/bleed easily.  Psychiatric/Behavioral: Negative for depression and sleep disturbance. The patient is not nervous/anxious.     Physical Exam:  vitals were not taken for this visit.   Wt Readings from Last 3 Encounters:  10/03/17 168 lb 4 oz (76.3 kg)  09/19/17 166  lb (75.3 kg)  09/05/17 167 lb (75.8 kg)    Physical Exam  Constitutional: He is oriented to person, place, and time.  HENT:  Head: Normocephalic and atraumatic.  Mouth/Throat: Oropharynx is clear and moist.  Eyes: EOM are normal. Pupils are equal, round, and reactive to light.  Neck: Normal range of motion.  Cardiovascular: Normal rate, regular rhythm and normal heart sounds.  Pulmonary/Chest: Effort normal and breath sounds normal.  Abdominal: Soft. Bowel  sounds are normal.  Musculoskeletal: Normal range of motion. He exhibits no edema, tenderness or deformity.  Lymphadenopathy:    He has no cervical adenopathy.  Neurological: He is alert and oriented to person, place, and time.  Skin: Skin is warm and dry. No rash noted. No erythema.  Psychiatric: He has a normal mood and affect. His behavior is normal. Judgment and thought content normal.  Vitals reviewed.    Lab Results  Component Value Date   WBC 4.8 10/03/2017   HGB 12.1 (L) 08/08/2017   HCT 32.5 (L) 10/03/2017   MCV 92.9 10/03/2017   PLT 77 (L) 10/03/2017     Chemistry      Component Value Date/Time   NA 143 10/03/2017 1040   NA 141 08/08/2017 0802   K 3.4 10/03/2017 1040   K 3.7 08/08/2017 0802   CL 109 (H) 10/03/2017 1040   CL 105 08/08/2017 0802   CO2 28 10/03/2017 1040   CO2 29 08/08/2017 0802   BUN 13 10/03/2017 1040   BUN 15 08/08/2017 0802   CREATININE 1.20 10/03/2017 1040   CREATININE 1.1 08/08/2017 0802      Component Value Date/Time   CALCIUM 9.4 10/03/2017 1040   CALCIUM 9.2 08/08/2017 0802   ALKPHOS 83 10/03/2017 1040   ALKPHOS 86 (H) 08/08/2017 0802   AST 40 (H) 10/03/2017 1040   ALT 32 10/03/2017 1040   ALT 64 (H) 08/08/2017 0802   BILITOT 1.4 10/03/2017 1040       Impression and Plan: Mr. Lybbert is a 72 year old white male.  He has a stage IIIb adenocarcinoma of the sigmoid colon.  He had this resected.  Even though he had negative lymph nodes, he had 2 nodules which may have been lymph nodes.  This will be his final cycle of chemotherapy.  He really has done nicely.  We will get him back here in 6 weeks.  I will do a CT scan we see him back.  Our surveillance plan will be to see him every 3 months.  We will do labs every 3 months.  Her next scan will be in 6 months.  He really has done nicely.  He has really persevered through treatment.  I would think that the neuropathy will improve.   Volanda Napoleon, MD 2/27/201911:21 AM

## 2017-10-03 NOTE — Progress Notes (Signed)
Ok to treat with PLT count today per MD Marin Olp

## 2017-10-03 NOTE — Patient Instructions (Signed)
[facility] Discharge Instructions for Patients Receiving Chemotherapy  Today you received the following chemotherapy agents Leucovorin, Fluoracil.  To help prevent nausea and vomiting after your treatment, we encourage you to take your nausea medication as prscribed by your MD.   If you develop nausea and vomiting that is not controlled by your nausea medication, call the clinic.   BELOW ARE SYMPTOMS THAT SHOULD BE REPORTED IMMEDIATELY:  *FEVER GREATER THAN 100.5 F  *CHILLS WITH OR WITHOUT FEVER  NAUSEA AND VOMITING THAT IS NOT CONTROLLED WITH YOUR NAUSEA MEDICATION  *UNUSUAL SHORTNESS OF BREATH  *UNUSUAL BRUISING OR BLEEDING  TENDERNESS IN MOUTH AND THROAT WITH OR WITHOUT PRESENCE OF ULCERS  *URINARY PROBLEMS  *BOWEL PROBLEMS  UNUSUAL RASH Items with * indicate a potential emergency and should be followed up as soon as possible.  Feel free to call the clinic should you have any questions or concerns. The clinic phone number is [phone].  Please show the [facility] at check-in to the Emergency Department and triage nurse.

## 2017-10-05 ENCOUNTER — Inpatient Hospital Stay: Payer: Medicare Other | Attending: Hematology & Oncology

## 2017-10-05 VITALS — BP 108/66 | HR 85 | Temp 98.2°F | Resp 18

## 2017-10-05 DIAGNOSIS — C187 Malignant neoplasm of sigmoid colon: Secondary | ICD-10-CM | POA: Insufficient documentation

## 2017-10-05 MED ORDER — SODIUM CHLORIDE 0.9% FLUSH
10.0000 mL | INTRAVENOUS | Status: DC | PRN
  Administered 2017-10-05: 10 mL
  Filled 2017-10-05: qty 10

## 2017-10-05 MED ORDER — HEPARIN SOD (PORK) LOCK FLUSH 100 UNIT/ML IV SOLN
500.0000 [IU] | Freq: Once | INTRAVENOUS | Status: AC | PRN
  Administered 2017-10-05: 500 [IU]
  Filled 2017-10-05: qty 5

## 2017-11-13 ENCOUNTER — Ambulatory Visit (HOSPITAL_BASED_OUTPATIENT_CLINIC_OR_DEPARTMENT_OTHER)
Admission: RE | Admit: 2017-11-13 | Discharge: 2017-11-13 | Disposition: A | Discharge status: Home / Self Care | Payer: Medicare Other | Source: Ambulatory Visit | Attending: Hematology & Oncology | Admitting: Hematology & Oncology

## 2017-11-13 ENCOUNTER — Other Ambulatory Visit: Payer: Self-pay

## 2017-11-13 ENCOUNTER — Inpatient Hospital Stay: Payer: Medicare Other

## 2017-11-13 ENCOUNTER — Other Ambulatory Visit: Payer: Medicare Other

## 2017-11-13 ENCOUNTER — Encounter: Payer: Self-pay | Admitting: Hematology & Oncology

## 2017-11-13 ENCOUNTER — Inpatient Hospital Stay: Payer: Medicare Other | Attending: Hematology & Oncology | Admitting: Hematology & Oncology

## 2017-11-13 VITALS — Wt 169.0 lb

## 2017-11-13 DIAGNOSIS — Z79899 Other long term (current) drug therapy: Secondary | ICD-10-CM

## 2017-11-13 DIAGNOSIS — I2510 Atherosclerotic heart disease of native coronary artery without angina pectoris: Secondary | ICD-10-CM | POA: Diagnosis not present

## 2017-11-13 DIAGNOSIS — K319 Disease of stomach and duodenum, unspecified: Secondary | ICD-10-CM | POA: Diagnosis not present

## 2017-11-13 DIAGNOSIS — Z95828 Presence of other vascular implants and grafts: Secondary | ICD-10-CM

## 2017-11-13 DIAGNOSIS — C187 Malignant neoplasm of sigmoid colon: Secondary | ICD-10-CM | POA: Insufficient documentation

## 2017-11-13 DIAGNOSIS — G629 Polyneuropathy, unspecified: Secondary | ICD-10-CM

## 2017-11-13 DIAGNOSIS — N200 Calculus of kidney: Secondary | ICD-10-CM | POA: Insufficient documentation

## 2017-11-13 DIAGNOSIS — K317 Polyp of stomach and duodenum: Secondary | ICD-10-CM

## 2017-11-13 DIAGNOSIS — Z9221 Personal history of antineoplastic chemotherapy: Secondary | ICD-10-CM

## 2017-11-13 LAB — CBC WITH DIFFERENTIAL (CANCER CENTER ONLY)
BASOS ABS: 0 10*3/uL (ref 0.0–0.1)
Basophils Relative: 0 %
Eosinophils Absolute: 0.1 10*3/uL (ref 0.0–0.5)
Eosinophils Relative: 2 %
HCT: 34.7 % — ABNORMAL LOW (ref 38.7–49.9)
HEMOGLOBIN: 11.9 g/dL — AB (ref 13.0–17.1)
LYMPHS PCT: 23 %
Lymphs Abs: 1.4 10*3/uL (ref 0.9–3.3)
MCH: 32.7 pg (ref 28.0–33.4)
MCHC: 34.3 g/dL (ref 32.0–35.9)
MCV: 95.3 fL (ref 82.0–98.0)
Monocytes Absolute: 0.8 10*3/uL (ref 0.1–0.9)
Monocytes Relative: 13 %
NEUTROS PCT: 62 %
Neutro Abs: 3.7 10*3/uL (ref 1.5–6.5)
Platelet Count: 101 10*3/uL — ABNORMAL LOW (ref 145–400)
RBC: 3.64 MIL/uL — AB (ref 4.20–5.70)
RDW: 12.9 % (ref 11.1–15.7)
WBC: 6 10*3/uL (ref 4.0–10.0)

## 2017-11-13 LAB — CMP (CANCER CENTER ONLY)
ALBUMIN: 3.7 g/dL (ref 3.5–5.0)
ALT: 24 U/L (ref 10–47)
ANION GAP: 4 — AB (ref 5–15)
AST: 31 U/L (ref 11–38)
Alkaline Phosphatase: 96 U/L — ABNORMAL HIGH (ref 26–84)
BUN: 15 mg/dL (ref 7–22)
CALCIUM: 9.1 mg/dL (ref 8.0–10.3)
CO2: 30 mmol/L (ref 18–33)
CREATININE: 1.3 mg/dL — AB (ref 0.60–1.20)
Chloride: 108 mmol/L (ref 98–108)
Glucose, Bld: 105 mg/dL (ref 73–118)
Potassium: 4 mmol/L (ref 3.3–4.7)
Sodium: 142 mmol/L (ref 128–145)
TOTAL PROTEIN: 6.5 g/dL (ref 6.4–8.1)
Total Bilirubin: 1 mg/dL (ref 0.2–1.6)

## 2017-11-13 LAB — CEA (IN HOUSE-CHCC): CEA (CHCC-IN HOUSE): 1.76 ng/mL (ref 0.00–5.00)

## 2017-11-13 MED ORDER — SODIUM CHLORIDE 0.9% FLUSH
10.0000 mL | INTRAVENOUS | Status: DC | PRN
  Administered 2017-11-13: 10 mL via INTRAVENOUS
  Filled 2017-11-13: qty 10

## 2017-11-13 MED ORDER — HEPARIN SOD (PORK) LOCK FLUSH 100 UNIT/ML IV SOLN
500.0000 [IU] | Freq: Once | INTRAVENOUS | Status: AC
  Administered 2017-11-13: 500 [IU] via INTRAVENOUS
  Filled 2017-11-13: qty 5

## 2017-11-13 MED ORDER — IOPAMIDOL (ISOVUE-300) INJECTION 61%
100.0000 mL | Freq: Once | INTRAVENOUS | Status: AC | PRN
  Administered 2017-11-13: 100 mL via INTRAVENOUS

## 2017-11-13 NOTE — Progress Notes (Signed)
Hematology and Oncology Follow Up Visit  Jack Gray [id number] 01/03/1946 72 y.o. 11/13/2017   Principle Diagnosis:   Stage IIIB (T3N1cM0) adenocarcinoma of the sigmoid colon  Current Therapy:    Status post cycle #8 of adjuvant FOLFOX (oxaliplatin d/c'ed w/  cycle #6) - completed on 10/03/2017     Interim History:  Jack Gray is back for follow-up.  He is still dealing with some neuropathy.  We discontinue the oxaliplatin after cycle 6.  Hopefully, the neuropathy will improve.  We did go ahead and get a CT scan for restaging.  The CT scan was done today.  There is no evidence of recurrent or metastatic colon cancer.  However, there is reported to be a 2 cm polyp in the descending duodenum.  I am not sure whether this really means.  Given this, we will have to get him back to gastroenterology so that they can do an upper endoscopy on him.  He actually played golf.  He enjoyed this.  He does feel tired.  I told him that it will take 8 months for him to recover from his protocol treatment.  His last CEA level back in February was 2.2.  He has had a better appetite.  He has had no nausea or vomiting.  He has had no bleeding.  He has had no leg swelling.  He has had no mouth sores.  He is worried about a couple spots on his lower back.  I looked at these.  They look like keratoses.  He does see a dermatologist.  Overall, his performance status is ECOG 1.   Medications:  Current Outpatient Medications:  .  atorvastatin (LIPITOR) 20 MG tablet, Take 20 mg by mouth daily at 6 PM., Disp: , Rfl:  .  b complex vitamins tablet, Take 1 tablet by mouth daily., Disp: , Rfl:  .  Cholecalciferol (VITAMIN D) 2000 units CAPS, Take 2,000 Units by mouth daily., Disp: , Rfl:  .  dexamethasone (DECADRON) 4 MG tablet, Take 2 tablets (8 mg total) daily by mouth. Start the day after chemotherapy for 2 days. Take with food., Disp: 30 tablet, Rfl: 1 .  lidocaine-prilocaine (EMLA) cream, Applying to [facility] site 1  hour prior to chemotherapy appointment.  Place a plastic wrap on top of the cream to keep this against the skin, Disp: 30 g, Rfl: 4 .  LORazepam (ATIVAN) 0.5 MG tablet, Take 1 tablet (0.5 mg total) every 6 (six) hours as needed by mouth (Nausea or vomiting)., Disp: 30 tablet, Rfl: 0 .  Multiple Vitamins-Minerals (PRESERVISION AREDS PO), Take 1 tablet by mouth 2 (two) times daily., Disp: , Rfl:  .  omeprazole (PRILOSEC) 20 MG capsule, Take 20 mg by mouth daily., Disp: , Rfl:  .  ondansetron (ZOFRAN) 8 MG tablet, Take 1 tablet (8 mg total) 2 (two) times daily as needed by mouth for refractory nausea / vomiting. Start on day 3 after chemotherapy., Disp: 30 tablet, Rfl: 1 .  prochlorperazine (COMPAZINE) 10 MG tablet, Take 1 tablet (10 mg total) every 6 (six) hours as needed by mouth (Nausea or vomiting)., Disp: 30 tablet, Rfl: 1  Allergies: No Known Allergies  Past Medical History, Surgical history, Social history, and Family History were reviewed and updated.  Review of Systems: Review of Systems  Constitutional: Negative for appetite change, fatigue, fever and unexpected weight change.  HENT:   Negative for lump/mass, mouth sores, sore throat and trouble swallowing.   Respiratory: Negative for cough, hemoptysis and  shortness of breath.   Cardiovascular: Negative for leg swelling and palpitations.  Gastrointestinal: Negative for abdominal distention, abdominal pain, blood in stool, constipation, diarrhea, nausea and vomiting.  Genitourinary: Negative for bladder incontinence, dysuria, frequency and hematuria.   Musculoskeletal: Negative for arthralgias, back pain, gait problem and myalgias.  Skin: Negative for itching and rash.  Neurological: Negative for dizziness, extremity weakness, gait problem, headaches, numbness, seizures and speech difficulty.  Hematological: Does not bruise/bleed easily.  Psychiatric/Behavioral: Negative for depression and sleep disturbance. The patient is not  nervous/anxious.     Physical Exam:  weight is 169 lb (76.7 kg).   Wt Readings from Last 3 Encounters:  11/13/17 169 lb (76.7 kg)  10/03/17 168 lb 4 oz (76.3 kg)  09/19/17 166 lb (75.3 kg)    Physical Exam  Constitutional: He is oriented to person, place, and time.  HENT:  Head: Normocephalic and atraumatic.  Mouth/Throat: Oropharynx is clear and moist.  Eyes: Pupils are equal, round, and reactive to light. EOM are normal.  Neck: Normal range of motion.  Cardiovascular: Normal rate, regular rhythm and normal heart sounds.  Pulmonary/Chest: Effort normal and breath sounds normal.  Abdominal: Soft. Bowel sounds are normal.  Musculoskeletal: Normal range of motion. He exhibits no edema, tenderness or deformity.  Lymphadenopathy:    He has no cervical adenopathy.  Neurological: He is alert and oriented to person, place, and time.  Skin: Skin is warm and dry. No rash noted. No erythema.  Psychiatric: He has a normal mood and affect. His behavior is normal. Judgment and thought content normal.  Vitals reviewed.    Lab Results  Component Value Date   WBC 6.0 11/13/2017   HGB 12.1 (L) 08/08/2017   HCT 34.7 (L) 11/13/2017   MCV 95.3 11/13/2017   PLT 101 (L) 11/13/2017     Chemistry      Component Value Date/Time   NA 142 11/13/2017 0905   NA 141 08/08/2017 0802   K 4.0 11/13/2017 0905   K 3.7 08/08/2017 0802   CL 108 11/13/2017 0905   CL 105 08/08/2017 0802   CO2 30 11/13/2017 0905   CO2 29 08/08/2017 0802   BUN 15 11/13/2017 0905   BUN 15 08/08/2017 0802   CREATININE 1.30 (H) 11/13/2017 0905   CREATININE 1.1 08/08/2017 0802      Component Value Date/Time   CALCIUM 9.1 11/13/2017 0905   CALCIUM 9.2 08/08/2017 0802   ALKPHOS 96 (H) 11/13/2017 0905   ALKPHOS 86 (H) 08/08/2017 0802   AST 31 11/13/2017 0905   ALT 24 11/13/2017 0905   ALT 64 (H) 08/08/2017 0802   BILITOT 1.0 11/13/2017 0905       Impression and Plan: Jack Gray is a 72 year old white male.  He has  a stage IIIb adenocarcinoma of the sigmoid colon.  He had this resected.  Even though he had negative lymph nodes, he had 2 nodules which may have been lymph nodes.  Everything looks good from my point of view.  We will see what Dr. Oletta Lamas of gastroenterology can do about this descending duodenum polyp.  I will repeat his scans in 3 months.  If the next set of scans looks good, then we will hopefully pull the scans out to every 4 months.  I think that the risk of recurrence for him will be less than 15%.  He still has his Port-A-Cath in.  I would favor keeping this in for right now.  I spent about 30 minutes  with he and his wife.  Over 50% of the time was spent face-to-face going over his lab results and his CT scan results.   Volanda Napoleon, MD 4/9/201911:26 AM

## 2017-11-13 NOTE — Patient Instructions (Signed)

## 2017-11-14 ENCOUNTER — Telehealth: Payer: Self-pay | Admitting: *Deleted

## 2017-11-14 NOTE — Telephone Encounter (Addendum)
Pt aware of results.  ----- Message from Volanda Napoleon, MD sent at 11/13/2017  2:44 PM EDT ----- Call - the tumor marker is ok!!  Laurey Arrow

## 2017-11-19 DIAGNOSIS — C187 Malignant neoplasm of sigmoid colon: Secondary | ICD-10-CM | POA: Diagnosis not present

## 2017-11-19 DIAGNOSIS — R933 Abnormal findings on diagnostic imaging of other parts of digestive tract: Secondary | ICD-10-CM | POA: Diagnosis not present

## 2017-11-19 DIAGNOSIS — Z8619 Personal history of other infectious and parasitic diseases: Secondary | ICD-10-CM | POA: Diagnosis not present

## 2017-11-22 DIAGNOSIS — K319 Disease of stomach and duodenum, unspecified: Secondary | ICD-10-CM | POA: Diagnosis not present

## 2017-11-22 DIAGNOSIS — R933 Abnormal findings on diagnostic imaging of other parts of digestive tract: Secondary | ICD-10-CM | POA: Diagnosis not present

## 2017-11-22 DIAGNOSIS — K5710 Diverticulosis of small intestine without perforation or abscess without bleeding: Secondary | ICD-10-CM | POA: Diagnosis not present

## 2017-11-27 DIAGNOSIS — K319 Disease of stomach and duodenum, unspecified: Secondary | ICD-10-CM | POA: Diagnosis not present

## 2017-12-03 DIAGNOSIS — C187 Malignant neoplasm of sigmoid colon: Secondary | ICD-10-CM | POA: Diagnosis not present

## 2017-12-05 DIAGNOSIS — Z452 Encounter for adjustment and management of vascular access device: Secondary | ICD-10-CM | POA: Diagnosis not present

## 2017-12-11 DIAGNOSIS — L812 Freckles: Secondary | ICD-10-CM | POA: Diagnosis not present

## 2017-12-11 DIAGNOSIS — D225 Melanocytic nevi of trunk: Secondary | ICD-10-CM | POA: Diagnosis not present

## 2017-12-11 DIAGNOSIS — L821 Other seborrheic keratosis: Secondary | ICD-10-CM | POA: Diagnosis not present

## 2017-12-18 DIAGNOSIS — H2513 Age-related nuclear cataract, bilateral: Secondary | ICD-10-CM | POA: Diagnosis not present

## 2017-12-25 ENCOUNTER — Other Ambulatory Visit: Payer: Medicare Other

## 2018-01-29 DIAGNOSIS — Z125 Encounter for screening for malignant neoplasm of prostate: Secondary | ICD-10-CM | POA: Diagnosis not present

## 2018-01-29 DIAGNOSIS — C187 Malignant neoplasm of sigmoid colon: Secondary | ICD-10-CM | POA: Diagnosis not present

## 2018-01-29 DIAGNOSIS — E785 Hyperlipidemia, unspecified: Secondary | ICD-10-CM | POA: Diagnosis not present

## 2018-01-29 DIAGNOSIS — Z0000 Encounter for general adult medical examination without abnormal findings: Secondary | ICD-10-CM | POA: Diagnosis not present

## 2018-01-29 DIAGNOSIS — H35369 Drusen (degenerative) of macula, unspecified eye: Secondary | ICD-10-CM | POA: Diagnosis not present

## 2018-01-29 DIAGNOSIS — Z86010 Personal history of colonic polyps: Secondary | ICD-10-CM | POA: Diagnosis not present

## 2018-01-29 DIAGNOSIS — C4481 Basal cell carcinoma of overlapping sites of skin: Secondary | ICD-10-CM | POA: Diagnosis not present

## 2018-01-29 DIAGNOSIS — R7309 Other abnormal glucose: Secondary | ICD-10-CM | POA: Diagnosis not present

## 2018-01-29 DIAGNOSIS — R251 Tremor, unspecified: Secondary | ICD-10-CM | POA: Diagnosis not present

## 2018-01-29 DIAGNOSIS — Z1159 Encounter for screening for other viral diseases: Secondary | ICD-10-CM | POA: Diagnosis not present

## 2018-01-29 DIAGNOSIS — G620 Drug-induced polyneuropathy: Secondary | ICD-10-CM | POA: Diagnosis not present

## 2018-02-12 ENCOUNTER — Inpatient Hospital Stay: Payer: Medicare Other | Attending: Hematology & Oncology | Admitting: Hematology & Oncology

## 2018-02-12 ENCOUNTER — Encounter (HOSPITAL_BASED_OUTPATIENT_CLINIC_OR_DEPARTMENT_OTHER): Payer: Self-pay

## 2018-02-12 ENCOUNTER — Ambulatory Visit (HOSPITAL_BASED_OUTPATIENT_CLINIC_OR_DEPARTMENT_OTHER)
Admission: RE | Admit: 2018-02-12 | Discharge: 2018-02-12 | Disposition: A | Discharge status: Home / Self Care | Payer: Medicare Other | Source: Ambulatory Visit | Attending: Hematology & Oncology | Admitting: Hematology & Oncology

## 2018-02-12 ENCOUNTER — Inpatient Hospital Stay: Payer: Medicare Other

## 2018-02-12 ENCOUNTER — Telehealth: Payer: Self-pay | Admitting: *Deleted

## 2018-02-12 ENCOUNTER — Encounter: Payer: Self-pay | Admitting: Hematology & Oncology

## 2018-02-12 ENCOUNTER — Other Ambulatory Visit: Payer: Self-pay

## 2018-02-12 ENCOUNTER — Other Ambulatory Visit: Payer: Medicare Other

## 2018-02-12 VITALS — BP 148/88 | HR 72 | Temp 97.9°F | Resp 16

## 2018-02-12 DIAGNOSIS — K429 Umbilical hernia without obstruction or gangrene: Secondary | ICD-10-CM | POA: Insufficient documentation

## 2018-02-12 DIAGNOSIS — G629 Polyneuropathy, unspecified: Secondary | ICD-10-CM | POA: Insufficient documentation

## 2018-02-12 DIAGNOSIS — C187 Malignant neoplasm of sigmoid colon: Secondary | ICD-10-CM

## 2018-02-12 DIAGNOSIS — R911 Solitary pulmonary nodule: Secondary | ICD-10-CM | POA: Insufficient documentation

## 2018-02-12 DIAGNOSIS — K319 Disease of stomach and duodenum, unspecified: Secondary | ICD-10-CM | POA: Diagnosis not present

## 2018-02-12 DIAGNOSIS — N200 Calculus of kidney: Secondary | ICD-10-CM | POA: Insufficient documentation

## 2018-02-12 DIAGNOSIS — M549 Dorsalgia, unspecified: Secondary | ICD-10-CM | POA: Diagnosis not present

## 2018-02-12 DIAGNOSIS — Z79899 Other long term (current) drug therapy: Secondary | ICD-10-CM | POA: Diagnosis not present

## 2018-02-12 DIAGNOSIS — Z9221 Personal history of antineoplastic chemotherapy: Secondary | ICD-10-CM | POA: Insufficient documentation

## 2018-02-12 DIAGNOSIS — C189 Malignant neoplasm of colon, unspecified: Secondary | ICD-10-CM | POA: Diagnosis not present

## 2018-02-12 DIAGNOSIS — I2510 Atherosclerotic heart disease of native coronary artery without angina pectoris: Secondary | ICD-10-CM | POA: Insufficient documentation

## 2018-02-12 DIAGNOSIS — N400 Enlarged prostate without lower urinary tract symptoms: Secondary | ICD-10-CM | POA: Insufficient documentation

## 2018-02-12 LAB — CMP (CANCER CENTER ONLY)
ALK PHOS: 76 U/L (ref 26–84)
ALT: 30 U/L (ref 10–47)
ANION GAP: 10 (ref 5–15)
AST: 37 U/L (ref 11–38)
Albumin: 3.8 g/dL (ref 3.5–5.0)
BILIRUBIN TOTAL: 1.1 mg/dL (ref 0.2–1.6)
BUN: 15 mg/dL (ref 7–22)
CO2: 31 mmol/L (ref 18–33)
Calcium: 9.5 mg/dL (ref 8.0–10.3)
Chloride: 104 mmol/L (ref 98–108)
Creatinine: 1.1 mg/dL (ref 0.60–1.20)
Glucose, Bld: 103 mg/dL (ref 73–118)
POTASSIUM: 4.9 mmol/L — AB (ref 3.3–4.7)
Sodium: 145 mmol/L (ref 128–145)
TOTAL PROTEIN: 6.6 g/dL (ref 6.4–8.1)

## 2018-02-12 LAB — CBC WITH DIFFERENTIAL (CANCER CENTER ONLY)
Basophils Absolute: 0 10*3/uL (ref 0.0–0.1)
Basophils Relative: 0 %
EOS PCT: 2 %
Eosinophils Absolute: 0.1 10*3/uL (ref 0.0–0.5)
HEMATOCRIT: 39.6 % (ref 38.7–49.9)
Hemoglobin: 13.7 g/dL (ref 13.0–17.1)
Lymphocytes Relative: 27 %
Lymphs Abs: 1.7 10*3/uL (ref 0.9–3.3)
MCH: 30.9 pg (ref 28.0–33.4)
MCHC: 34.6 g/dL (ref 32.0–35.9)
MCV: 89.4 fL (ref 82.0–98.0)
MONO ABS: 0.7 10*3/uL (ref 0.1–0.9)
MONOS PCT: 11 %
NEUTROS ABS: 3.8 10*3/uL (ref 1.5–6.5)
Neutrophils Relative %: 60 %
Platelet Count: 116 10*3/uL — ABNORMAL LOW (ref 145–400)
RBC: 4.43 MIL/uL (ref 4.20–5.70)
RDW: 12.5 % (ref 11.1–15.7)
WBC Count: 6.3 10*3/uL (ref 4.0–10.0)

## 2018-02-12 LAB — CEA (IN HOUSE-CHCC): CEA (CHCC-In House): 1.27 ng/mL (ref 0.00–5.00)

## 2018-02-12 MED ORDER — IOPAMIDOL (ISOVUE-300) INJECTION 61%
100.0000 mL | Freq: Once | INTRAVENOUS | Status: AC | PRN
  Administered 2018-02-12: 100 mL via INTRAVENOUS

## 2018-02-12 NOTE — Progress Notes (Deleted)
Hematology and Oncology Follow Up Visit  Jack Gray [id number] March 09, 1946 72 y.o. 02/12/2018   Principle Diagnosis:   Stage IIIB (T3N1cM0) adenocarcinoma of the sigmoid colon  Current Therapy:    Status post cycle #8 of adjuvant FOLFOX (oxaliplatin d/c'ed w/  cycle #6) - completed on 10/03/2017     Interim History:  Jack Gray is back for follow-up.we did go ahead and get a CT scan on him today.  This was done as our surveillance program for him.  The CT scan did not show any evidence of recurrent colon cancer.  However, the radiologist noted that there was a new 3 mm nodule in the left lower lung.  As such, we now are obligated to follow-up with this.  He is still dealing with neuropathy.  He says it might be getting a little bit better.  I we will try him on some vitamin B6.  He has 50 mg tablets at home.  I told him to take 5 or 6 of these tablets.  He has had no problems with nausea or vomiting.  He has had no bleeding.  Is had no change in bowel or bladder habits.  There is been no cough or shortness of breath.  His CEA level today was 1.27.   Overall, his performance status is ECOG 1.   Medications:  Current Outpatient Medications:  .  atorvastatin (LIPITOR) 20 MG tablet, Take 20 mg by mouth daily at 6 PM., Disp: , Rfl:  .  b complex vitamins tablet, Take 1 tablet by mouth daily., Disp: , Rfl:  .  Cholecalciferol (VITAMIN D) 2000 units CAPS, Take 2,000 Units by mouth daily., Disp: , Rfl:  .  Multiple Vitamins-Minerals (PRESERVISION AREDS PO), Take 1 tablet by mouth 2 (two) times daily., Disp: , Rfl:   Allergies: No Known Allergies  Past Medical History, Surgical history, Social history, and Family History were reviewed and updated.  Review of Systems: Review of Systems  Constitutional: Negative for appetite change, fatigue, fever and unexpected weight change.  HENT:   Negative for lump/mass, mouth sores, sore throat and trouble swallowing.   Respiratory: Negative for  cough, hemoptysis and shortness of breath.   Cardiovascular: Negative for leg swelling and palpitations.  Gastrointestinal: Negative for abdominal distention, abdominal pain, blood in stool, constipation, diarrhea, nausea and vomiting.  Genitourinary: Negative for bladder incontinence, dysuria, frequency and hematuria.   Musculoskeletal: Negative for arthralgias, back pain, gait problem and myalgias.  Skin: Negative for itching and rash.  Neurological: Negative for dizziness, extremity weakness, gait problem, headaches, numbness, seizures and speech difficulty.  Hematological: Does not bruise/bleed easily.  Psychiatric/Behavioral: Negative for depression and sleep disturbance. The patient is not nervous/anxious.     Physical Exam:  oral temperature is 97.9 F (36.6 C). His blood pressure is 148/88 (abnormal) and his pulse is 72. His respiration is 16.   Wt Readings from Last 3 Encounters:  11/13/17 169 lb (76.7 kg)  10/03/17 168 lb 4 oz (76.3 kg)  09/19/17 166 lb (75.3 kg)    Physical Exam  Constitutional: He is oriented to person, place, and time.  HENT:  Head: Normocephalic and atraumatic.  Mouth/Throat: Oropharynx is clear and moist.  Eyes: Pupils are equal, round, and reactive to light. EOM are normal.  Neck: Normal range of motion.  Cardiovascular: Normal rate, regular rhythm and normal heart sounds.  Pulmonary/Chest: Effort normal and breath sounds normal.  Abdominal: Soft. Bowel sounds are normal.  Musculoskeletal: Normal range of motion. He  exhibits no edema, tenderness or deformity.  Lymphadenopathy:    He has no cervical adenopathy.  Neurological: He is alert and oriented to person, place, and time.  Skin: Skin is warm and dry. No rash noted. No erythema.  Psychiatric: He has a normal mood and affect. His behavior is normal. Judgment and thought content normal.  Vitals reviewed.    Lab Results  Component Value Date   WBC 6.3 02/12/2018   HGB 13.7 02/12/2018    HCT 39.6 02/12/2018   MCV 89.4 02/12/2018   PLT 116 (L) 02/12/2018     Chemistry      Component Value Date/Time   NA 145 02/12/2018 0901   NA 141 08/08/2017 0802   K 4.9 (H) 02/12/2018 0901   K 3.7 08/08/2017 0802   CL 104 02/12/2018 0901   CL 105 08/08/2017 0802   CO2 31 02/12/2018 0901   CO2 29 08/08/2017 0802   BUN 15 02/12/2018 0901   BUN 15 08/08/2017 0802   CREATININE 1.10 02/12/2018 0901   CREATININE 1.1 08/08/2017 0802      Component Value Date/Time   CALCIUM 9.5 02/12/2018 0901   CALCIUM 9.2 08/08/2017 0802   ALKPHOS 76 02/12/2018 0901   ALKPHOS 86 (H) 08/08/2017 0802   AST 37 02/12/2018 0901   ALT 30 02/12/2018 0901   ALT 64 (H) 08/08/2017 0802   BILITOT 1.1 02/12/2018 0901       Impression and Plan: Jack Gray is a 72 year old white male.  He has a stage IIIb adenocarcinoma of the sigmoid colon.  He had this resected.  Even though he had negative lymph nodes, he had 2 nodules which may have been lymph nodes.  From the CT scan, which are went over with he and his wife, I do not see any problems with his colon cancer.  I told him that if his colon cancer came back it would come back to his liver and not his lung.  A 3 mm nodule is totally nondescript.    Hopefully, the neuropathy will continue to improve slowly.  I told him to make sure that he wears sunscreen.  He has wife are going to the beach.  She is having a lot of back problems right now.  I will see him back in 3 months.  If this lung nodule is better, then we can probably move his scans out to every 4 months.  I spent 35 minutes with him and his wife.  This was all spent face-to-face with him.  I counseled them and talk to them about the CAT scan.  I answered their questions.  Volanda Napoleon, MD 7/9/20192:06 PM

## 2018-02-12 NOTE — Telephone Encounter (Addendum)
Patient is aware of results  ----- Message from Volanda Napoleon, MD sent at 02/12/2018 12:34 PM EDT ----- Call - CEA is normal.  It is only 1.27.  Jack Gray

## 2018-02-13 NOTE — Progress Notes (Signed)
Hematology and Oncology Follow Up Visit  ASAHD CAN [id number] Feb 16, 1946 72 y.o. 02/13/2018   Principle Diagnosis:   Stage IIIB (T3N1cM0) adenocarcinoma of the sigmoid colon  Current Therapy:    Status post cycle #8 of adjuvant FOLFOX (oxaliplatin d/c'ed w/  cycle #6) - completed on 10/03/2017     Interim History:  Mr. Delage is back for follow-up.  He is still dealing with some neuropathy.  He was told about vitamin B6.  I think this is a good idea.  He has vitamin B6 at home.  We will put him on 250 mg a day.  We did go ahead and get a CT scan on him.  The CT scan did not show any recurrent colon cancer.  However, the radiologist, commented that there is a new 3 mm nodule in the left lower lung.  Because this, we have to do another CT scan on him sooner than I would like.  He never has smoked.  His CEA is 1.27.  He has had no problems with nausea or vomiting.  He is eating well.  His Port-A-Cath is now well.  He and his wife are planning to go to the beach on Thursday.  She is having a lot of problems with her back.  She sees a Restaurant manager, fast food.  He has had no change in his medications.  Overall, his performance status is ECOG 1.     Medications:  Current Outpatient Medications:  .  atorvastatin (LIPITOR) 20 MG tablet, Take 20 mg by mouth daily at 6 PM., Disp: , Rfl:  .  b complex vitamins tablet, Take 1 tablet by mouth daily., Disp: , Rfl:  .  Cholecalciferol (VITAMIN D) 2000 units CAPS, Take 2,000 Units by mouth daily., Disp: , Rfl:  .  Multiple Vitamins-Minerals (PRESERVISION AREDS PO), Take 1 tablet by mouth 2 (two) times daily., Disp: , Rfl:   Allergies: No Known Allergies  Past Medical History, Surgical history, Social history, and Family History were reviewed and updated.  Review of Systems: Review of Systems  Constitutional: Negative for appetite change, fatigue, fever and unexpected weight change.  HENT:   Negative for lump/mass, mouth sores, sore throat and trouble  swallowing.   Respiratory: Negative for cough, hemoptysis and shortness of breath.   Cardiovascular: Negative for leg swelling and palpitations.  Gastrointestinal: Negative for abdominal distention, abdominal pain, blood in stool, constipation, diarrhea, nausea and vomiting.  Genitourinary: Negative for bladder incontinence, dysuria, frequency and hematuria.   Musculoskeletal: Negative for arthralgias, back pain, gait problem and myalgias.  Skin: Negative for itching and rash.  Neurological: Negative for dizziness, extremity weakness, gait problem, headaches, numbness, seizures and speech difficulty.  Hematological: Does not bruise/bleed easily.  Psychiatric/Behavioral: Negative for depression and sleep disturbance. The patient is not nervous/anxious.     Physical Exam:  oral temperature is 97.9 F (36.6 C). His blood pressure is 148/88 (abnormal) and his pulse is 72. His respiration is 16.   Wt Readings from Last 3 Encounters:  11/13/17 169 lb (76.7 kg)  10/03/17 168 lb 4 oz (76.3 kg)  09/19/17 166 lb (75.3 kg)    Physical Exam  Constitutional: He is oriented to person, place, and time.  HENT:  Head: Normocephalic and atraumatic.  Mouth/Throat: Oropharynx is clear and moist.  Eyes: Pupils are equal, round, and reactive to light. EOM are normal.  Neck: Normal range of motion.  Cardiovascular: Normal rate, regular rhythm and normal heart sounds.  Pulmonary/Chest: Effort normal and breath sounds  normal.  Abdominal: Soft. Bowel sounds are normal.  Musculoskeletal: Normal range of motion. He exhibits no edema, tenderness or deformity.  Lymphadenopathy:    He has no cervical adenopathy.  Neurological: He is alert and oriented to person, place, and time.  Skin: Skin is warm and dry. No rash noted. No erythema.  Psychiatric: He has a normal mood and affect. His behavior is normal. Judgment and thought content normal.  Vitals reviewed.    Lab Results  Component Value Date   WBC  6.3 02/12/2018   HGB 13.7 02/12/2018   HCT 39.6 02/12/2018   MCV 89.4 02/12/2018   PLT 116 (L) 02/12/2018     Chemistry      Component Value Date/Time   NA 145 02/12/2018 0901   NA 141 08/08/2017 0802   K 4.9 (H) 02/12/2018 0901   K 3.7 08/08/2017 0802   CL 104 02/12/2018 0901   CL 105 08/08/2017 0802   CO2 31 02/12/2018 0901   CO2 29 08/08/2017 0802   BUN 15 02/12/2018 0901   BUN 15 08/08/2017 0802   CREATININE 1.10 02/12/2018 0901   CREATININE 1.1 08/08/2017 0802      Component Value Date/Time   CALCIUM 9.5 02/12/2018 0901   CALCIUM 9.2 08/08/2017 0802   ALKPHOS 76 02/12/2018 0901   ALKPHOS 86 (H) 08/08/2017 0802   AST 37 02/12/2018 0901   ALT 30 02/12/2018 0901   ALT 64 (H) 08/08/2017 0802   BILITOT 1.1 02/12/2018 0901       Impression and Plan: Mr. Cuffe is a 72 year old white male.  He has a stage IIIb adenocarcinoma of the sigmoid colon.  He had this resected.  Even though he had negative lymph nodes, he had 2 nodules which may have been lymph nodes.  We are still in surveillance mode.  I will set him up with a CT scan in 3 months.  We have to follow-up on this nodule in the left lung.  I told Mr. Spatafore that his colon cancer will go to his liver before he would go to his lung.  I will see him back in 3 months.  We will do the CT scan same day that I see him.  Volanda Napoleon, MD 7/10/20198:11 AM

## 2018-04-15 DIAGNOSIS — Z85038 Personal history of other malignant neoplasm of large intestine: Secondary | ICD-10-CM | POA: Diagnosis not present

## 2018-04-15 DIAGNOSIS — K5710 Diverticulosis of small intestine without perforation or abscess without bleeding: Secondary | ICD-10-CM | POA: Diagnosis not present

## 2018-04-23 DIAGNOSIS — D490 Neoplasm of unspecified behavior of digestive system: Secondary | ICD-10-CM | POA: Diagnosis not present

## 2018-04-23 DIAGNOSIS — D124 Benign neoplasm of descending colon: Secondary | ICD-10-CM | POA: Diagnosis not present

## 2018-04-23 DIAGNOSIS — K640 First degree hemorrhoids: Secondary | ICD-10-CM | POA: Diagnosis not present

## 2018-04-23 DIAGNOSIS — Z85038 Personal history of other malignant neoplasm of large intestine: Secondary | ICD-10-CM | POA: Diagnosis not present

## 2018-04-30 DIAGNOSIS — D490 Neoplasm of unspecified behavior of digestive system: Secondary | ICD-10-CM | POA: Diagnosis not present

## 2018-04-30 DIAGNOSIS — D124 Benign neoplasm of descending colon: Secondary | ICD-10-CM | POA: Diagnosis not present

## 2018-05-14 ENCOUNTER — Ambulatory Visit (HOSPITAL_BASED_OUTPATIENT_CLINIC_OR_DEPARTMENT_OTHER)
Admission: RE | Admit: 2018-05-14 | Discharge: 2018-05-14 | Disposition: A | Discharge status: Home / Self Care | Payer: Medicare Other | Source: Ambulatory Visit | Attending: Hematology & Oncology | Admitting: Hematology & Oncology

## 2018-05-14 ENCOUNTER — Encounter (HOSPITAL_BASED_OUTPATIENT_CLINIC_OR_DEPARTMENT_OTHER): Payer: Self-pay

## 2018-05-14 ENCOUNTER — Inpatient Hospital Stay: Payer: Medicare Other | Attending: Hematology & Oncology

## 2018-05-14 DIAGNOSIS — R918 Other nonspecific abnormal finding of lung field: Secondary | ICD-10-CM | POA: Diagnosis not present

## 2018-05-14 DIAGNOSIS — C187 Malignant neoplasm of sigmoid colon: Secondary | ICD-10-CM

## 2018-05-14 DIAGNOSIS — C189 Malignant neoplasm of colon, unspecified: Secondary | ICD-10-CM | POA: Diagnosis not present

## 2018-05-14 DIAGNOSIS — R911 Solitary pulmonary nodule: Secondary | ICD-10-CM | POA: Insufficient documentation

## 2018-05-14 DIAGNOSIS — Z79899 Other long term (current) drug therapy: Secondary | ICD-10-CM | POA: Diagnosis not present

## 2018-05-14 DIAGNOSIS — Z9221 Personal history of antineoplastic chemotherapy: Secondary | ICD-10-CM | POA: Insufficient documentation

## 2018-05-14 DIAGNOSIS — I700 Atherosclerosis of aorta: Secondary | ICD-10-CM | POA: Insufficient documentation

## 2018-05-14 LAB — CEA (IN HOUSE-CHCC): CEA (CHCC-In House): 1.4 ng/mL (ref 0.00–5.00)

## 2018-05-14 LAB — CMP (CANCER CENTER ONLY)
ALBUMIN: 4.1 g/dL (ref 3.5–5.0)
ALK PHOS: 76 U/L (ref 26–84)
ALT: 30 U/L (ref 10–47)
AST: 40 U/L — AB (ref 11–38)
Anion gap: 1 — ABNORMAL LOW (ref 5–15)
BILIRUBIN TOTAL: 1.3 mg/dL (ref 0.2–1.6)
BUN: 18 mg/dL (ref 7–22)
CALCIUM: 10 mg/dL (ref 8.0–10.3)
CO2: 31 mmol/L (ref 18–33)
CREATININE: 1.3 mg/dL — AB (ref 0.60–1.20)
Chloride: 108 mmol/L (ref 98–108)
Glucose, Bld: 110 mg/dL (ref 73–118)
Potassium: 4.8 mmol/L — ABNORMAL HIGH (ref 3.3–4.7)
Sodium: 140 mmol/L (ref 128–145)
Total Protein: 7 g/dL (ref 6.4–8.1)

## 2018-05-14 LAB — CBC WITH DIFFERENTIAL (CANCER CENTER ONLY)
BASOS ABS: 0 10*3/uL (ref 0.0–0.1)
Basophils Relative: 0 %
Eosinophils Absolute: 0.1 10*3/uL (ref 0.0–0.5)
Eosinophils Relative: 2 %
HEMATOCRIT: 40.4 % (ref 38.7–49.9)
Hemoglobin: 13.9 g/dL (ref 13.0–17.1)
Lymphocytes Relative: 27 %
Lymphs Abs: 1.6 10*3/uL (ref 0.9–3.3)
MCH: 31.4 pg (ref 28.0–33.4)
MCHC: 34.4 g/dL (ref 32.0–35.9)
MCV: 91.2 fL (ref 82.0–98.0)
MONO ABS: 0.6 10*3/uL (ref 0.1–0.9)
Monocytes Relative: 10 %
NEUTROS ABS: 3.6 10*3/uL (ref 1.5–6.5)
Neutrophils Relative %: 61 %
Platelet Count: 136 10*3/uL — ABNORMAL LOW (ref 145–400)
RBC: 4.43 MIL/uL (ref 4.20–5.70)
RDW: 12.8 % (ref 11.1–15.7)
WBC Count: 5.8 10*3/uL (ref 4.0–10.0)

## 2018-05-14 LAB — LACTATE DEHYDROGENASE: LDH: 182 U/L (ref 98–192)

## 2018-05-14 MED ORDER — IOPAMIDOL (ISOVUE-300) INJECTION 61%
100.0000 mL | Freq: Once | INTRAVENOUS | Status: AC | PRN
  Administered 2018-05-14: 100 mL via INTRAVENOUS

## 2018-05-15 ENCOUNTER — Other Ambulatory Visit: Payer: Self-pay

## 2018-05-15 ENCOUNTER — Inpatient Hospital Stay (HOSPITAL_BASED_OUTPATIENT_CLINIC_OR_DEPARTMENT_OTHER): Payer: Medicare Other | Admitting: Hematology & Oncology

## 2018-05-15 ENCOUNTER — Encounter: Payer: Self-pay | Admitting: Hematology & Oncology

## 2018-05-15 ENCOUNTER — Other Ambulatory Visit: Payer: Medicare Other

## 2018-05-15 DIAGNOSIS — Z9221 Personal history of antineoplastic chemotherapy: Secondary | ICD-10-CM

## 2018-05-15 DIAGNOSIS — Z79899 Other long term (current) drug therapy: Secondary | ICD-10-CM | POA: Diagnosis not present

## 2018-05-15 DIAGNOSIS — R918 Other nonspecific abnormal finding of lung field: Secondary | ICD-10-CM

## 2018-05-15 DIAGNOSIS — C187 Malignant neoplasm of sigmoid colon: Secondary | ICD-10-CM | POA: Diagnosis not present

## 2018-05-15 NOTE — Progress Notes (Signed)
Hematology and Oncology Follow Up Visit  STRATTON VILLWOCK [id number] [birth year] 72 y.o. 05/15/2018   Principle Diagnosis:   Stage IIIB (T3N1cM0) adenocarcinoma of the sigmoid colon  Current Therapy:    Status post cycle #8 of adjuvant FOLFOX (oxaliplatin d/c'ed w/  cycle #6) - completed on 10/03/2017     Interim History:  Mr. Langhorst is back for follow-up.  He had a good summer.  He has been more active.  He looks fantastic.  He is been playing some golf.  We did go ahead and do his CT scan for surveillance.  Surprisingly, there is a nodule in the left lower lung which is grown from 3 mm to 7 mm.  The radiologist feels this is "suspicious" for malignancy.  His CEA is normal.  The CT scan does not show anything else that would be suggestive of recurrent disease.  I would find it very hard to believe that he has recurrence in his lung and not in his liver.  As such, this nodule, if it is larger will likely be a primary non-small cell lung cancer.  He has never smoked.  I do not think there is any occupational exposures.  He is had no cough.  Overall, his performance status is ECOG 1.     Medications:  Current Outpatient Medications:  .  atorvastatin (LIPITOR) 20 MG tablet, Take 20 mg by mouth daily at 6 PM., Disp: , Rfl:  .  b complex vitamins tablet, Take 1 tablet by mouth daily., Disp: , Rfl:  .  Cholecalciferol (VITAMIN D) 2000 units CAPS, Take 2,000 Units by mouth daily., Disp: , Rfl:  .  Multiple Vitamins-Minerals (PRESERVISION AREDS PO), Take 1 tablet by mouth 2 (two) times daily., Disp: , Rfl:   Allergies: No Known Allergies  Past Medical History, Surgical history, Social history, and Family History were reviewed and updated.  Review of Systems: Review of Systems  Constitutional: Negative for appetite change, fatigue, fever and unexpected weight change.  HENT:   Negative for lump/mass, mouth sores, sore throat and trouble swallowing.   Respiratory: Negative for cough,  hemoptysis and shortness of breath.   Cardiovascular: Negative for leg swelling and palpitations.  Gastrointestinal: Negative for abdominal distention, abdominal pain, blood in stool, constipation, diarrhea, nausea and vomiting.  Genitourinary: Negative for bladder incontinence, dysuria, frequency and hematuria.   Musculoskeletal: Negative for arthralgias, back pain, gait problem and myalgias.  Skin: Negative for itching and rash.  Neurological: Negative for dizziness, extremity weakness, gait problem, headaches, numbness, seizures and speech difficulty.  Hematological: Does not bruise/bleed easily.  Psychiatric/Behavioral: Negative for depression and sleep disturbance. The patient is not nervous/anxious.     Physical Exam:  weight is 166 lb (75.3 kg). His oral temperature is 98.1 F (36.7 C). His blood pressure is 131/81 and his pulse is 62. His respiration is 20 and oxygen saturation is 100%.   Wt Readings from Last 3 Encounters:  05/15/18 166 lb (75.3 kg)  11/13/17 169 lb (76.7 kg)  10/03/17 168 lb 4 oz (76.3 kg)    Physical Exam  Constitutional: He is oriented to person, place, and time.  HENT:  Head: Normocephalic and atraumatic.  Mouth/Throat: Oropharynx is clear and moist.  Eyes: Pupils are equal, round, and reactive to light. EOM are normal.  Neck: Normal range of motion.  Cardiovascular: Normal rate, regular rhythm and normal heart sounds.  Pulmonary/Chest: Effort normal and breath sounds normal.  Abdominal: Soft. Bowel sounds are normal.  Musculoskeletal: Normal range  of motion. He exhibits no edema, tenderness or deformity.  Lymphadenopathy:    He has no cervical adenopathy.  Neurological: He is alert and oriented to person, place, and time.  Skin: Skin is warm and dry. No rash noted. No erythema.  Psychiatric: He has a normal mood and affect. His behavior is normal. Judgment and thought content normal.  Vitals reviewed.    Lab Results  Component Value Date   WBC  5.8 05/14/2018   HGB 13.9 05/14/2018   HCT 40.4 05/14/2018   MCV 91.2 05/14/2018   PLT 136 (L) 05/14/2018     Chemistry      Component Value Date/Time   NA 140 05/14/2018 0810   NA 141 08/08/2017 0802   K 4.8 (H) 05/14/2018 0810   K 3.7 08/08/2017 0802   CL 108 05/14/2018 0810   CL 105 08/08/2017 0802   CO2 31 05/14/2018 0810   CO2 29 08/08/2017 0802   BUN 18 05/14/2018 0810   BUN 15 08/08/2017 0802   CREATININE 1.30 (H) 05/14/2018 0810   CREATININE 1.1 08/08/2017 0802      Component Value Date/Time   CALCIUM 10.0 05/14/2018 0810   CALCIUM 9.2 08/08/2017 0802   ALKPHOS 76 05/14/2018 0810   ALKPHOS 86 (H) 08/08/2017 0802   AST 40 (H) 05/14/2018 0810   ALT 30 05/14/2018 0810   ALT 64 (H) 08/08/2017 0802   BILITOT 1.3 05/14/2018 0810       Impression and Plan: Mr. Goldammer is a 72 year old white male.  He has a stage IIIb adenocarcinoma of the sigmoid colon.  He had this resected.  Even though he had negative lymph nodes, he had 2 nodules which may have been lymph nodes.  Now we have to worry about this left lower lung nodule.  If the next CT scan shows that it is larger, I would get him straight to surgery to see about resecting it.  I want to have a CT scan done in 6 weeks.  I spent a good 35 minutes with Mr. Ksiazek and his wife.  I reviewed the scan results.  I went over his labs.  All the time spent face-to-face.  This is a somewhat complicated situation right now.  Volanda Napoleon, MD 10/9/201911:51 AM

## 2018-05-22 DIAGNOSIS — Z23 Encounter for immunization: Secondary | ICD-10-CM | POA: Diagnosis not present

## 2018-06-06 DIAGNOSIS — R935 Abnormal findings on diagnostic imaging of other abdominal regions, including retroperitoneum: Secondary | ICD-10-CM | POA: Diagnosis not present

## 2018-06-06 DIAGNOSIS — K3189 Other diseases of stomach and duodenum: Secondary | ICD-10-CM | POA: Diagnosis not present

## 2018-06-06 DIAGNOSIS — K5710 Diverticulosis of small intestine without perforation or abscess without bleeding: Secondary | ICD-10-CM | POA: Diagnosis not present

## 2018-06-25 DIAGNOSIS — C187 Malignant neoplasm of sigmoid colon: Secondary | ICD-10-CM | POA: Diagnosis not present

## 2018-06-25 DIAGNOSIS — Z86010 Personal history of colonic polyps: Secondary | ICD-10-CM | POA: Diagnosis not present

## 2018-06-25 DIAGNOSIS — K2970 Gastritis, unspecified, without bleeding: Secondary | ICD-10-CM | POA: Diagnosis not present

## 2018-06-25 DIAGNOSIS — K5710 Diverticulosis of small intestine without perforation or abscess without bleeding: Secondary | ICD-10-CM | POA: Diagnosis not present

## 2018-06-26 ENCOUNTER — Other Ambulatory Visit: Payer: Self-pay | Admitting: *Deleted

## 2018-06-26 DIAGNOSIS — C187 Malignant neoplasm of sigmoid colon: Secondary | ICD-10-CM

## 2018-06-27 ENCOUNTER — Ambulatory Visit (HOSPITAL_BASED_OUTPATIENT_CLINIC_OR_DEPARTMENT_OTHER)
Admission: RE | Admit: 2018-06-27 | Discharge: 2018-06-27 | Disposition: A | Discharge status: Home / Self Care | Payer: Medicare Other | Source: Ambulatory Visit | Attending: Hematology & Oncology | Admitting: Hematology & Oncology

## 2018-06-27 ENCOUNTER — Telehealth: Payer: Self-pay | Admitting: Hematology & Oncology

## 2018-06-27 ENCOUNTER — Inpatient Hospital Stay: Payer: Medicare Other | Attending: Hematology & Oncology | Admitting: Hematology & Oncology

## 2018-06-27 ENCOUNTER — Inpatient Hospital Stay: Payer: Medicare Other

## 2018-06-27 ENCOUNTER — Encounter: Payer: Self-pay | Admitting: Hematology & Oncology

## 2018-06-27 ENCOUNTER — Other Ambulatory Visit: Payer: Self-pay

## 2018-06-27 VITALS — BP 138/87 | HR 69 | Temp 98.7°F | Resp 18 | Wt 164.0 lb

## 2018-06-27 DIAGNOSIS — C187 Malignant neoplasm of sigmoid colon: Secondary | ICD-10-CM

## 2018-06-27 DIAGNOSIS — R918 Other nonspecific abnormal finding of lung field: Secondary | ICD-10-CM

## 2018-06-27 DIAGNOSIS — I700 Atherosclerosis of aorta: Secondary | ICD-10-CM | POA: Diagnosis not present

## 2018-06-27 DIAGNOSIS — N200 Calculus of kidney: Secondary | ICD-10-CM | POA: Diagnosis not present

## 2018-06-27 DIAGNOSIS — Z79899 Other long term (current) drug therapy: Secondary | ICD-10-CM | POA: Diagnosis not present

## 2018-06-27 DIAGNOSIS — I2510 Atherosclerotic heart disease of native coronary artery without angina pectoris: Secondary | ICD-10-CM | POA: Insufficient documentation

## 2018-06-27 DIAGNOSIS — R911 Solitary pulmonary nodule: Secondary | ICD-10-CM | POA: Diagnosis not present

## 2018-06-27 DIAGNOSIS — Z85038 Personal history of other malignant neoplasm of large intestine: Secondary | ICD-10-CM | POA: Insufficient documentation

## 2018-06-27 LAB — CBC WITH DIFFERENTIAL (CANCER CENTER ONLY)
Abs Immature Granulocytes: 0.02 10*3/uL (ref 0.00–0.07)
BASOS ABS: 0 10*3/uL (ref 0.0–0.1)
Basophils Relative: 0 %
EOS ABS: 0.1 10*3/uL (ref 0.0–0.5)
EOS PCT: 2 %
HCT: 41.4 % (ref 39.0–52.0)
Hemoglobin: 13.9 g/dL (ref 13.0–17.0)
Immature Granulocytes: 0 %
Lymphocytes Relative: 25 %
Lymphs Abs: 1.5 10*3/uL (ref 0.7–4.0)
MCH: 30.3 pg (ref 26.0–34.0)
MCHC: 33.6 g/dL (ref 30.0–36.0)
MCV: 90.4 fL (ref 80.0–100.0)
Monocytes Absolute: 0.6 10*3/uL (ref 0.1–1.0)
Monocytes Relative: 10 %
Neutro Abs: 3.8 10*3/uL (ref 1.7–7.7)
Neutrophils Relative %: 63 %
Platelet Count: 140 10*3/uL — ABNORMAL LOW (ref 150–400)
RBC: 4.58 MIL/uL (ref 4.22–5.81)
RDW: 11.9 % (ref 11.5–15.5)
WBC: 6.1 10*3/uL (ref 4.0–10.5)
nRBC: 0 % (ref 0.0–0.2)

## 2018-06-27 LAB — CMP (CANCER CENTER ONLY)
ALT: 28 U/L (ref 10–47)
AST: 37 U/L (ref 11–38)
Albumin: 4.2 g/dL (ref 3.5–5.0)
Alkaline Phosphatase: 69 U/L (ref 26–84)
Anion gap: 3 — ABNORMAL LOW (ref 5–15)
BUN: 20 mg/dL (ref 7–22)
CHLORIDE: 105 mmol/L (ref 98–108)
CO2: 31 mmol/L (ref 18–33)
CREATININE: 1.3 mg/dL — AB (ref 0.60–1.20)
Calcium: 9.8 mg/dL (ref 8.0–10.3)
Glucose, Bld: 115 mg/dL (ref 73–118)
Potassium: 4.7 mmol/L (ref 3.3–4.7)
SODIUM: 139 mmol/L (ref 128–145)
Total Bilirubin: 1.2 mg/dL (ref 0.2–1.6)
Total Protein: 7.1 g/dL (ref 6.4–8.1)

## 2018-06-27 LAB — LACTATE DEHYDROGENASE: LDH: 163 U/L (ref 98–192)

## 2018-06-27 LAB — CEA (IN HOUSE-CHCC): CEA (CHCC-IN HOUSE): 1.38 ng/mL (ref 0.00–5.00)

## 2018-06-27 NOTE — Progress Notes (Addendum)
Hematology and Oncology Follow Up Visit  Jack Gray [id number] 02-09-1946 72 y.o. 06/27/2018   Principle Diagnosis:   Stage IIIB (T3N1cM0) adenocarcinoma of the sigmoid colon -- BRAF (+)  Current Therapy:    Status post cycle #8 of adjuvant FOLFOX (oxaliplatin d/c'ed w/  cycle #6) - completed on 10/03/2017     Interim History:  Jack Gray is back for follow-up.  Unfortunately, I think we have a new problem.  He had a follow-up chest CT scan done today.  The left lower lobe nodule that was seen on his last CT scan is larger.  And now measures 1.1 cm.  It is in the medial basilar left lower lobe.  His last CEA was only 1.4.  He is never smoked.  He does have quite a bit of secondhand exposure when he was growing up.  I think that this tumor clearly needs to come out.  I do not know if this is metastasis from his sigmoid cancer.  Again I would think this would be a little unusual given that this was in the sigmoid: And that the liver is doing fine.  I will refer him to Dr. Merilynn Finland of thoracic surgery and we will see if Dr. Koleen Nimrod can do a VATS procedure to remove this nodule in a couple weeks.  I do not know if a PET scan would really make a difference right now.  I do not think that it would.  Again he has a normal CEA level.  His CT scan looks fine everywhere else.  He feels great.  He has been out playing golf.  He has had a good appetite.  There is been no weight loss.  Overall, his performance status is ECOG 0.   Medications:  Current Outpatient Medications:  .  omeprazole (PRILOSEC) 20 MG capsule, Take 20 mg by mouth daily., Disp: , Rfl:  .  atorvastatin (LIPITOR) 20 MG tablet, Take 20 mg by mouth daily at 6 PM., Disp: , Rfl:  .  b complex vitamins tablet, Take 1 tablet by mouth daily., Disp: , Rfl:  .  Cholecalciferol (VITAMIN D) 2000 units CAPS, Take 2,000 Units by mouth daily., Disp: , Rfl:  .  Multiple Vitamins-Minerals (PRESERVISION AREDS PO), Take 1 tablet  by mouth 2 (two) times daily., Disp: , Rfl:   Allergies: No Known Allergies  Past Medical History, Surgical history, Social history, and Family History were reviewed and updated.  Review of Systems: Review of Systems  Constitutional: Negative for appetite change, fatigue, fever and unexpected weight change.  HENT:   Negative for lump/mass, mouth sores, sore throat and trouble swallowing.   Respiratory: Negative for cough, hemoptysis and shortness of breath.   Cardiovascular: Negative for leg swelling and palpitations.  Gastrointestinal: Negative for abdominal distention, abdominal pain, blood in stool, constipation, diarrhea, nausea and vomiting.  Genitourinary: Negative for bladder incontinence, dysuria, frequency and hematuria.   Musculoskeletal: Negative for arthralgias, back pain, gait problem and myalgias.  Skin: Negative for itching and rash.  Neurological: Negative for dizziness, extremity weakness, gait problem, headaches, numbness, seizures and speech difficulty.  Hematological: Does not bruise/bleed easily.  Psychiatric/Behavioral: Negative for depression and sleep disturbance. The patient is not nervous/anxious.     Physical Exam:  weight is 164 lb (74.4 kg). His oral temperature is 98.7 F (37.1 C). His blood pressure is 138/87 and his pulse is 69. His respiration is 18 and oxygen saturation is 100%.   Wt Readings from Last 3 Encounters:  06/27/18 164 lb (74.4 kg)  05/15/18 166 lb (75.3 kg)  11/13/17 169 lb (76.7 kg)    Physical Exam  Constitutional: He is oriented to person, place, and time.  HENT:  Head: Normocephalic and atraumatic.  Mouth/Throat: Oropharynx is clear and moist.  Eyes: Pupils are equal, round, and reactive to light. EOM are normal.  Neck: Normal range of motion.  Cardiovascular: Normal rate, regular rhythm and normal heart sounds.  Pulmonary/Chest: Effort normal and breath sounds normal.  Abdominal: Soft. Bowel sounds are normal.    Musculoskeletal: Normal range of motion. He exhibits no edema, tenderness or deformity.  Lymphadenopathy:    He has no cervical adenopathy.  Neurological: He is alert and oriented to person, place, and time.  Skin: Skin is warm and dry. No rash noted. No erythema.  Psychiatric: He has a normal mood and affect. His behavior is normal. Judgment and thought content normal.  Vitals reviewed.    Lab Results  Component Value Date   WBC 6.1 06/27/2018   HGB 13.9 06/27/2018   HCT 41.4 06/27/2018   MCV 90.4 06/27/2018   PLT 140 (L) 06/27/2018     Chemistry      Component Value Date/Time   NA 139 06/27/2018 0915   NA 141 08/08/2017 0802   K 4.7 06/27/2018 0915   K 3.7 08/08/2017 0802   CL 105 06/27/2018 0915   CL 105 08/08/2017 0802   CO2 31 06/27/2018 0915   CO2 29 08/08/2017 0802   BUN 20 06/27/2018 0915   BUN 15 08/08/2017 0802   CREATININE 1.30 (H) 06/27/2018 0915   CREATININE 1.1 08/08/2017 0802      Component Value Date/Time   CALCIUM 9.8 06/27/2018 0915   CALCIUM 9.2 08/08/2017 0802   ALKPHOS 69 06/27/2018 0915   ALKPHOS 86 (H) 08/08/2017 0802   AST 37 06/27/2018 0915   ALT 28 06/27/2018 0915   ALT 64 (H) 08/08/2017 0802   BILITOT 1.2 06/27/2018 0915       Impression and Plan: Jack Gray is a 72 year old white male.  He has a stage IIIb adenocarcinoma of the sigmoid colon.  He had this resected.  Even though he had negative lymph nodes, he had 2 nodules which may have been lymph nodes.  We will have to see what we find with surgery.  I cannot imagine that Dr. Roxan Hockey would not operate on him.  Once we know what is going on, then we will be able to proceed accordingly.  If this is a primary lung cancer, I would not think that he would need any type of adjuvant therapy.  If this is metastatic colon cancer, we will have to see what the molecular profile shows and let that determine how he would be treated.  Given that his initial tumor was BRAF positive, then it is  possible that if this is metastatic colon cancer that this will also be BRAF positive and if so, we can use targeted therapy and not chemotherapy.  I am just very disappointed that we are dealing with this problem right now.  I just never would have thought that he would have any issues after he completed his adjuvant therapy.  I will see him back after New Year's.  I suspect that his surgery will be in mid or early December.    Volanda Napoleon, MD 11/21/201910:14 AM    ADDENDUM: I spoke with Dr. Merilynn Finland today.  He agrees that surgery is necessary.  He also  thought that a PET scan might be helpful.  I will see about getting the PET scan set up.  This, however, we will not determine surgery from being done.  Dr. Roxan Hockey will try to get Jack Gray in next week and hopefully be able to set up surgery for early December.  Lattie Haw, MD

## 2018-06-27 NOTE — Telephone Encounter (Signed)
sch 08/16/18 appt at 0800 per 11/21 los

## 2018-07-02 ENCOUNTER — Telehealth: Payer: Self-pay | Admitting: Hematology & Oncology

## 2018-07-02 NOTE — Telephone Encounter (Signed)
Received call from patient inquiring about his surgery appt. Informed pt that Dr Marin Olp has spoken with Dr Marcello Moores and his office will contact pt to schedule surgery in early Dec.

## 2018-07-08 ENCOUNTER — Telehealth: Payer: Self-pay | Admitting: Hematology & Oncology

## 2018-07-08 ENCOUNTER — Other Ambulatory Visit: Payer: Self-pay | Admitting: Family

## 2018-07-08 DIAGNOSIS — C187 Malignant neoplasm of sigmoid colon: Secondary | ICD-10-CM

## 2018-07-08 DIAGNOSIS — R911 Solitary pulmonary nodule: Secondary | ICD-10-CM

## 2018-07-08 NOTE — Telephone Encounter (Signed)
Appt for PET scheduled and patient has been notified of date/time/location per 12/2 staff message

## 2018-07-16 ENCOUNTER — Ambulatory Visit (HOSPITAL_COMMUNITY)
Admission: RE | Admit: 2018-07-16 | Discharge: 2018-07-16 | Disposition: A | Discharge status: Home / Self Care | Payer: Medicare Other | Source: Ambulatory Visit | Attending: Family | Admitting: Family

## 2018-07-16 DIAGNOSIS — R911 Solitary pulmonary nodule: Secondary | ICD-10-CM | POA: Diagnosis not present

## 2018-07-16 DIAGNOSIS — C187 Malignant neoplasm of sigmoid colon: Secondary | ICD-10-CM | POA: Diagnosis not present

## 2018-07-16 LAB — GLUCOSE, CAPILLARY: GLUCOSE-CAPILLARY: 119 mg/dL — AB (ref 70–99)

## 2018-07-16 MED ORDER — FLUDEOXYGLUCOSE F - 18 (FDG) INJECTION
8.1800 | Freq: Once | INTRAVENOUS | Status: AC | PRN
  Administered 2018-07-16: 8.18 via INTRAVENOUS

## 2018-07-17 ENCOUNTER — Telehealth: Payer: Self-pay | Admitting: *Deleted

## 2018-07-17 ENCOUNTER — Other Ambulatory Visit: Payer: Self-pay | Admitting: *Deleted

## 2018-07-17 NOTE — Telephone Encounter (Signed)
Spoke to patient to let him know below message from Dr. Marin Olp.  States it is an isolated spot and we will know more about it once he sees Dr. Roxan Hockey and has it biopsied.  Patient aware of this information and in agreement.

## 2018-07-17 NOTE — Telephone Encounter (Signed)
-----   Message from Volanda Napoleon, MD sent at 07/17/2018  7:05 AM EST ----- Call - the PET scan shows that the spot in the lung is the only active area of cancer.  Ask him when surgery  Is going to be??  Laurey Arrow

## 2018-07-19 ENCOUNTER — Encounter: Payer: Medicare Other | Admitting: Thoracic Surgery (Cardiothoracic Vascular Surgery)

## 2018-07-22 ENCOUNTER — Encounter: Payer: Medicare Other | Admitting: Thoracic Surgery (Cardiothoracic Vascular Surgery)

## 2018-07-23 ENCOUNTER — Encounter: Payer: Self-pay | Admitting: *Deleted

## 2018-07-23 ENCOUNTER — Institutional Professional Consult (permissible substitution) (INDEPENDENT_AMBULATORY_CARE_PROVIDER_SITE_OTHER): Payer: Medicare Other | Admitting: Thoracic Surgery (Cardiothoracic Vascular Surgery)

## 2018-07-23 ENCOUNTER — Other Ambulatory Visit: Payer: Self-pay | Admitting: *Deleted

## 2018-07-23 VITALS — BP 154/92 | HR 78 | Resp 20 | Ht 68.0 in | Wt 165.0 lb

## 2018-07-23 DIAGNOSIS — C187 Malignant neoplasm of sigmoid colon: Secondary | ICD-10-CM | POA: Diagnosis not present

## 2018-07-23 DIAGNOSIS — R911 Solitary pulmonary nodule: Secondary | ICD-10-CM

## 2018-07-23 NOTE — Progress Notes (Signed)
PCP is Vernie Shanks, MD Referring Provider is Volanda Napoleon, MD  Chief Complaint  Patient presents with  . Lung Lesion    Surgical eval, PET Scan 07/16/18, Chest CT 06/27/18, CT C/A/P  05/14/18, HX of Colon Cancer    HPI: Jack Gray is sent for consultation regarding a left lower lobe lung nodule  Jack Gray is a 72 year old gentleman who is a lifelong non-smoker.  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  He had a CT in July which showed a 3 mm left lower lobe lung nodule.  On follow-up CT in November the nodule had increased in size to 11 mm.  PET CT showed minimal uptake with an SUV of 1.7.  He has been feeling well.  His appetite is good.  He denies weight loss.  He has not had any cough or shortness of breath.  He denies any chest pain, pressure, or tightness.  Zubrod Score: At the time of surgery this patient's most appropriate activity status/level should be described as: [x]     0    Normal activity, no symptoms []     1    Restricted in physical strenuous activity but ambulatory, able to do out light work []     2    Ambulatory and capable of self care, unable to do work activities, up and about >50 % of waking hours                              []     3    Only limited self care, in bed greater than 50% of waking hours []     4    Completely disabled, no self care, confined to bed or chair []     5    Moribund  Past Medical History:  Diagnosis Date  . Anemia    as a child  . Cancer of sigmoid colon ([place]) 05/30/2017  . Counseling regarding goals of care 06/18/2017  . History of kidney stones   . Pneumonia    as a child    Past Surgical History:  Procedure Laterality Date  . cyctoscopy     for stones  . HERNIA REPAIR     r and left inguinal  30 years ago  . PARTIAL COLECTOMY Left 05/30/2017   Procedure: LAPAROSCOPIC ASSISTED LEFT COLECTOMY;  Surgeon: Fanny Skates, MD;  Location: [facility];   Service: General;  Laterality: Left;  GENERAL AND TAP BLOCK   . PORTACATH PLACEMENT Right 06/11/2017   Procedure: INSERTION PORT-A-CATH;  Surgeon: Fanny Skates, MD;  Location: [facility];  Service: General;  Laterality: Right;  . SHOULDER SURGERY     Bil    No family history on file.  Social History Social History   Tobacco Use  . Smoking status: Never Smoker  . Smokeless tobacco: Never Used  Substance Use Topics  . Alcohol use: No  . Drug use: No    Current Outpatient Medications  Medication Sig Dispense Refill  . atorvastatin (LIPITOR) 20 MG tablet Take 20 mg by mouth daily at 6 PM.    . b complex vitamins tablet Take 1 tablet by mouth daily.    . Cholecalciferol (VITAMIN D) 2000 units CAPS Take 2,000 Units by mouth daily.    . Multiple Vitamins-Minerals (PRESERVISION AREDS PO) Take 1 tablet by mouth 2 (two)  times daily.    Marland Kitchen omeprazole (PRILOSEC) 20 MG capsule Take 20 mg by mouth daily.     No current facility-administered medications for this visit.     No Known Allergies  Review of Systems  Constitutional: Negative for activity change and unexpected weight change.  HENT: Negative for trouble swallowing and voice change.   Eyes: Negative for visual disturbance.  Respiratory: Negative for cough, shortness of breath and wheezing.   Cardiovascular: Negative for chest pain and leg swelling.  Gastrointestinal: Negative for abdominal distention and abdominal pain.  Genitourinary: Negative for difficulty urinating and dysuria.  Musculoskeletal: Negative for arthralgias and myalgias.  Neurological: Negative for syncope and weakness.  Hematological: Negative for adenopathy. Does not bruise/bleed easily.    BP (!) 154/92   Pulse 78   Resp 20   Ht 5\' 8"  (1.727 m)   Wt 165 lb (74.8 kg)   SpO2 96% Comment: RA  BMI 25.09 kg/m  Physical Exam Vitals signs reviewed.  Constitutional:      General: He is not in acute distress. HENT:     Head: Normocephalic  and atraumatic.  Eyes:     Extraocular Movements: Extraocular movements intact.     Conjunctiva/sclera: Conjunctivae normal.     Pupils: Pupils are equal, round, and reactive to light.  Neck:     Musculoskeletal: Neck supple.  Cardiovascular:     Rate and Rhythm: Normal rate and regular rhythm.     Heart sounds: Normal heart sounds. No murmur. No friction rub. No gallop.   Pulmonary:     Effort: Pulmonary effort is normal.     Breath sounds: Normal breath sounds. No wheezing or rales.  Abdominal:     General: There is no distension.     Palpations: Abdomen is soft.  Musculoskeletal:     Right lower leg: No edema.     Left lower leg: No edema.  Lymphadenopathy:     Cervical: No cervical adenopathy.  Skin:    General: Skin is warm and dry.  Neurological:     General: No focal deficit present.     Mental Status: He is alert and oriented to person, place, and time.    Diagnostic Tests: NUCLEAR MEDICINE PET SKULL BASE TO THIGH  TECHNIQUE: 8.2 mCi F-18 FDG was injected intravenously. Full-ring PET imaging was performed from the skull base to thigh after the radiotracer. CT data was obtained and used for attenuation correction and anatomic localization.  Fasting blood glucose: 119 mg/dl  COMPARISON:  CT scans from 06/27/2018 and 05/14/2018  FINDINGS: Mediastinal blood pool activity: SUV max 2.4  NECK: No significant abnormal hypermetabolic activity in this region.  Incidental CT findings: Mild left common carotid artery atherosclerotic calcification.  CHEST: The left lower lobe pulmonary nodule measures 1.1 by 1.0 cm on image 53/8 and has a maximum SUV of 1.8.  Incidental CT findings: Coronary and descending thoracic aortic atherosclerotic calcification. Scarring or atelectasis in the right lower lobe, lingular, and right middle lobe.  ABDOMEN/PELVIS: No significant abnormal hypermetabolic activity in this region.  Incidental CT findings: Bilateral  nonobstructive nephrolithiasis. Aortoiliac atherosclerotic vascular disease. Distal descending colon diverticula. Partial left colectomy for the prior sigmoid cancer.  SKELETON: No significant abnormal hypermetabolic activity in this region.  Incidental CT findings: none  IMPRESSION: 1. The left lower lobe pulmonary nodule has a maximum SUV of 1.8. Given the progressive enlargement over the last 5 months, the lesion is still considered highly suspicious for malignancy and sample is likely  warranted. 2. No other regions of suspicion for active malignancy are identified. 3. Other imaging findings of potential clinical significance: Aortic Atherosclerosis (ICD10-I70.0). Coronary atherosclerosis. Bilateral nonobstructive nephrolithiasis.   Electronically Signed   By: Van Clines M.D.   On: 07/16/2018 13:24 I personally reviewed the CT and PET/CT images and concur with the findings noted above.  Impression: Jack Gray is a 72 year old non-smoker with a history of colon cancer.  He was first found to have a left lower lobe lung nodule on a CT of the chest back and July.  On a CT in November the nodule had increased from 3 to 11 mm.  Interestingly there is minimal activity on PET despite the significant increase in size.  He has no evidence of disease elsewhere based on CT or PET.  Differential diagnosis includes metastatic colon cancer, primary bronchogenic carcinoma, as well as infectious or inflammatory nodules.  We discussed potential diagnostic and treatment approaches.  I would favor surgical resection for definitive diagnosis and management at the same setting.  I do not think a CT-guided biopsy will be helpful because if it is positive he would still need resection and if negative I would not trust the findings due to the small size of the nodule.  I recommended that we proceed with left VATS for wedge resection.  If the intraoperative frozen section shows malignancy will  also do a node dissection.  Given the small size, peripheral location, and relatively minimal PET activity, I do not think a lobectomy will be necessary even if this does turn out to be a low-grade lung cancer.  I described the general nature of the operation to Jack Gray and his family.  They understand the need for general anesthesia, the incisions to be used, the use of a drainage tube postoperatively, the expected hospital stay, and the overall recovery.  I informed him of the indications, risk, benefits, and alternatives.  They understand the risk include, but not limited to death, MI, DVT, PE, bleeding, possible need for transfusion, infection, prolonged air leak, cardiac arrhythmias, as well as the possibility of other unforeseeable complications.  He wishes to proceed  Plan: Left VATS for wedge resection on 08/08/2017  [name], MD [facility] [phone]

## 2018-07-23 NOTE — H&P (View-Only) (Signed)
PCP is Vernie Shanks, MD Referring Provider is Volanda Napoleon, MD  Chief Complaint  Patient presents with  . Lung Lesion    Surgical eval, PET Scan 07/16/18, Chest CT 06/27/18, CT C/A/P  05/14/18, HX of Colon Cancer    HPI: Mr. Minniefield is sent for consultation regarding a left lower lobe lung nodule  Red Mandt is a 72 year old gentleman who is a lifelong non-smoker.  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  He had a CT in July which showed a 3 mm left lower lobe lung nodule.  On follow-up CT in November the nodule had increased in size to 11 mm.  PET CT showed minimal uptake with an SUV of 1.7.  He has been feeling well.  His appetite is good.  He denies weight loss.  He has not had any cough or shortness of breath.  He denies any chest pain, pressure, or tightness.  Zubrod Score: At the time of surgery this patient's most appropriate activity status/level should be described as: [x]     0    Normal activity, no symptoms []     1    Restricted in physical strenuous activity but ambulatory, able to do out light work []     2    Ambulatory and capable of self care, unable to do work activities, up and about >50 % of waking hours                              []     3    Only limited self care, in bed greater than 50% of waking hours []     4    Completely disabled, no self care, confined to bed or chair []     5    Moribund  Past Medical History:  Diagnosis Date  . Anemia    as a child  . Cancer of sigmoid colon ([place]) 05/30/2017  . Counseling regarding goals of care 06/18/2017  . History of kidney stones   . Pneumonia    as a child    Past Surgical History:  Procedure Laterality Date  . cyctoscopy     for stones  . HERNIA REPAIR     r and left inguinal  30 years ago  . PARTIAL COLECTOMY Left 05/30/2017   Procedure: LAPAROSCOPIC ASSISTED LEFT COLECTOMY;  Surgeon: Fanny Skates, MD;  Location: [facility];   Service: General;  Laterality: Left;  GENERAL AND TAP BLOCK   . PORTACATH PLACEMENT Right 06/11/2017   Procedure: INSERTION PORT-A-CATH;  Surgeon: Fanny Skates, MD;  Location: [place];  Service: General;  Laterality: Right;  . SHOULDER SURGERY     Bil    No family history on file.  Social History Social History   Tobacco Use  . Smoking status: Never Smoker  . Smokeless tobacco: Never Used  Substance Use Topics  . Alcohol use: No  . Drug use: No    Current Outpatient Medications  Medication Sig Dispense Refill  . atorvastatin (LIPITOR) 20 MG tablet Take 20 mg by mouth daily at 6 PM.    . b complex vitamins tablet Take 1 tablet by mouth daily.    . Cholecalciferol (VITAMIN D) 2000 units CAPS Take 2,000 Units by mouth daily.    . Multiple Vitamins-Minerals (PRESERVISION AREDS PO) Take 1 tablet by mouth 2 (two)  times daily.    Marland Kitchen omeprazole (PRILOSEC) 20 MG capsule Take 20 mg by mouth daily.     No current facility-administered medications for this visit.     No Known Allergies  Review of Systems  Constitutional: Negative for activity change and unexpected weight change.  HENT: Negative for trouble swallowing and voice change.   Eyes: Negative for visual disturbance.  Respiratory: Negative for cough, shortness of breath and wheezing.   Cardiovascular: Negative for chest pain and leg swelling.  Gastrointestinal: Negative for abdominal distention and abdominal pain.  Genitourinary: Negative for difficulty urinating and dysuria.  Musculoskeletal: Negative for arthralgias and myalgias.  Neurological: Negative for syncope and weakness.  Hematological: Negative for adenopathy. Does not bruise/bleed easily.    BP (!) 154/92   Pulse 78   Resp 20   Ht 5\' 8"  (1.727 m)   Wt 165 lb (74.8 kg)   SpO2 96% Comment: RA  BMI 25.09 kg/m  Physical Exam Vitals signs reviewed.  Constitutional:      General: He is not in acute distress. HENT:     Head: Normocephalic  and atraumatic.  Eyes:     Extraocular Movements: Extraocular movements intact.     Conjunctiva/sclera: Conjunctivae normal.     Pupils: Pupils are equal, round, and reactive to light.  Neck:     Musculoskeletal: Neck supple.  Cardiovascular:     Rate and Rhythm: Normal rate and regular rhythm.     Heart sounds: Normal heart sounds. No murmur. No friction rub. No gallop.   Pulmonary:     Effort: Pulmonary effort is normal.     Breath sounds: Normal breath sounds. No wheezing or rales.  Abdominal:     General: There is no distension.     Palpations: Abdomen is soft.  Musculoskeletal:     Right lower leg: No edema.     Left lower leg: No edema.  Lymphadenopathy:     Cervical: No cervical adenopathy.  Skin:    General: Skin is warm and dry.  Neurological:     General: No focal deficit present.     Mental Status: He is alert and oriented to person, place, and time.    Diagnostic Tests: NUCLEAR MEDICINE PET SKULL BASE TO THIGH  TECHNIQUE: 8.2 mCi F-18 FDG was injected intravenously. Full-ring PET imaging was performed from the skull base to thigh after the radiotracer. CT data was obtained and used for attenuation correction and anatomic localization.  Fasting blood glucose: 119 mg/dl  COMPARISON:  CT scans from 06/27/2018 and 05/14/2018  FINDINGS: Mediastinal blood pool activity: SUV max 2.4  NECK: No significant abnormal hypermetabolic activity in this region.  Incidental CT findings: Mild left common carotid artery atherosclerotic calcification.  CHEST: The left lower lobe pulmonary nodule measures 1.1 by 1.0 cm on image 53/8 and has a maximum SUV of 1.8.  Incidental CT findings: Coronary and descending thoracic aortic atherosclerotic calcification. Scarring or atelectasis in the right lower lobe, lingular, and right middle lobe.  ABDOMEN/PELVIS: No significant abnormal hypermetabolic activity in this region.  Incidental CT findings: Bilateral  nonobstructive nephrolithiasis. Aortoiliac atherosclerotic vascular disease. Distal descending colon diverticula. Partial left colectomy for the prior sigmoid cancer.  SKELETON: No significant abnormal hypermetabolic activity in this region.  Incidental CT findings: none  IMPRESSION: 1. The left lower lobe pulmonary nodule has a maximum SUV of 1.8. Given the progressive enlargement over the last 5 months, the lesion is still considered highly suspicious for malignancy and sample is likely  warranted. 2. No other regions of suspicion for active malignancy are identified. 3. Other imaging findings of potential clinical significance: Aortic Atherosclerosis (ICD10-I70.0). Coronary atherosclerosis. Bilateral nonobstructive nephrolithiasis.   Electronically Signed   By: Van Clines M.D.   On: 07/16/2018 13:24 I personally reviewed the CT and PET/CT images and concur with the findings noted above.  Impression: Mr. Morawski is a 72 year old non-smoker with a history of colon cancer.  He was first found to have a left lower lobe lung nodule on a CT of the chest back and July.  On a CT in November the nodule had increased from 3 to 11 mm.  Interestingly there is minimal activity on PET despite the significant increase in size.  He has no evidence of disease elsewhere based on CT or PET.  Differential diagnosis includes metastatic colon cancer, primary bronchogenic carcinoma, as well as infectious or inflammatory nodules.  We discussed potential diagnostic and treatment approaches.  I would favor surgical resection for definitive diagnosis and management at the same setting.  I do not think a CT-guided biopsy will be helpful because if it is positive he would still need resection and if negative I would not trust the findings due to the small size of the nodule.  I recommended that we proceed with left VATS for wedge resection.  If the intraoperative frozen section shows malignancy will  also do a node dissection.  Given the small size, peripheral location, and relatively minimal PET activity, I do not think a lobectomy will be necessary even if this does turn out to be a low-grade lung cancer.  I described the general nature of the operation to Mr. Tiley and his family.  They understand the need for general anesthesia, the incisions to be used, the use of a drainage tube postoperatively, the expected hospital stay, and the overall recovery.  I informed him of the indications, risk, benefits, and alternatives.  They understand the risk include, but not limited to death, MI, DVT, PE, bleeding, possible need for transfusion, infection, prolonged air leak, cardiac arrhythmias, as well as the possibility of other unforeseeable complications.  He wishes to proceed  Plan: Left VATS for wedge resection on 08/08/2017  [name], MD [facility] [phone]

## 2018-08-02 NOTE — Pre-Procedure Instructions (Signed)
[facility] [place]  08/02/2018      [id number] [place], [place] [address] [place] [address] Phone: [phone] Fax: [phone]    Your procedure is scheduled on August 08, 2018.  Report to [facility] Admitting at 600 AM  Call this number if you have problems the morning of surgery:  [phone]   Remember:  Do not eat or drink after midnight.    Take these medicines the morning of surgery with A SIP OF WATER  Omeprazole (prilosec)  7 days prior to surgery STOP taking any Aspirin (unless otherwise instructed by your surgeon), Aleve, Naproxen, Ibuprofen, Motrin, Advil, Goody's, BC's, all herbal medications, fish oil, and all vitamins    Do not wear jewelry  Do not wear lotions, powders, or colognes, or deodorant.  Men may shave face and neck.  Do not bring valuables to the [facility].  [facility] is not responsible for any belongings or valuables.  Contacts, dentures or bridgework may not be worn into surgery.  Leave your suitcase in the car.  After surgery it may be brought to your room.  For patients admitted to the [facility], discharge time will be determined by your treatment team.  Patients discharged the day of surgery will not be allowed to drive home.   [facility] [place]- Preparing For Surgery  Before surgery, you can play an important role. Because skin is not sterile, your skin needs to be as free of germs as possible. You can reduce the number of germs on your skin by washing with CHG (chlorahexidine gluconate) Soap before surgery.  CHG is an antiseptic cleaner which kills germs and bonds with the skin to continue killing germs even after washing.    Oral Hygiene is also important to reduce your risk of infection.  Remember - BRUSH YOUR TEETH THE MORNING OF SURGERY WITH YOUR REGULAR TOOTHPASTE  Please do not use if you have an allergy to CHG or antibacterial soaps. If your skin  becomes reddened/irritated stop using the CHG.  Do not shave (including legs and underarms) for at least 48 hours prior to first CHG shower. It is OK to shave your face.  Please follow these instructions carefully.   1. Shower the NIGHT BEFORE SURGERY and the MORNING OF SURGERY with CHG.   2. If you chose to wash your hair, wash your hair first as usual with your normal shampoo.  3. After you shampoo, rinse your hair and body thoroughly to remove the shampoo.  4. Use CHG as you would any other liquid soap. You can apply CHG directly to the skin and wash gently with a scrungie or a clean washcloth.   5. Apply the CHG Soap to your body ONLY FROM THE NECK DOWN.  Do not use on open wounds or open sores. Avoid contact with your eyes, ears, mouth and genitals (private parts). Wash Face and genitals (private parts)  with your normal soap.  6. Wash thoroughly, paying special attention to the area where your surgery will be performed.  7. Thoroughly rinse your body with warm water from the neck down.  8. DO NOT shower/wash with your normal soap after using and rinsing off the CHG Soap.  9. Pat yourself dry with a CLEAN TOWEL.  10. Wear CLEAN PAJAMAS to bed the night before surgery, wear comfortable clothes the morning of surgery  11. Place CLEAN SHEETS on  your bed the night of your first shower and DO NOT SLEEP WITH PETS.  Day of Surgery:  Do not apply any deodorants/lotions.  Please wear clean clothes to the [facility]/surgery center.   Remember to brush your teeth WITH YOUR REGULAR TOOTHPASTE.  Please read over the following fact sheets that you were given.

## 2018-08-06 ENCOUNTER — Other Ambulatory Visit: Payer: Self-pay

## 2018-08-06 ENCOUNTER — Encounter (HOSPITAL_COMMUNITY)
Admission: RE | Admit: 2018-08-06 | Discharge: 2018-08-06 | Disposition: A | Discharge status: Home / Self Care | Payer: Medicare Other | Source: Ambulatory Visit | Attending: Thoracic Surgery (Cardiothoracic Vascular Surgery) | Admitting: Thoracic Surgery (Cardiothoracic Vascular Surgery)

## 2018-08-06 ENCOUNTER — Ambulatory Visit (HOSPITAL_COMMUNITY)
Admission: RE | Admit: 2018-08-06 | Discharge: 2018-08-06 | Disposition: A | Discharge status: Home / Self Care | Payer: Medicare Other | Source: Ambulatory Visit | Attending: Thoracic Surgery (Cardiothoracic Vascular Surgery) | Admitting: Thoracic Surgery (Cardiothoracic Vascular Surgery)

## 2018-08-06 ENCOUNTER — Encounter (HOSPITAL_COMMUNITY): Payer: Self-pay

## 2018-08-06 DIAGNOSIS — Z85038 Personal history of other malignant neoplasm of large intestine: Secondary | ICD-10-CM | POA: Diagnosis not present

## 2018-08-06 DIAGNOSIS — J9811 Atelectasis: Secondary | ICD-10-CM | POA: Diagnosis not present

## 2018-08-06 DIAGNOSIS — Z9221 Personal history of antineoplastic chemotherapy: Secondary | ICD-10-CM | POA: Diagnosis not present

## 2018-08-06 DIAGNOSIS — C7802 Secondary malignant neoplasm of left lung: Secondary | ICD-10-CM | POA: Diagnosis not present

## 2018-08-06 DIAGNOSIS — C3432 Malignant neoplasm of lower lobe, left bronchus or lung: Secondary | ICD-10-CM | POA: Diagnosis not present

## 2018-08-06 DIAGNOSIS — Z79899 Other long term (current) drug therapy: Secondary | ICD-10-CM | POA: Diagnosis not present

## 2018-08-06 DIAGNOSIS — R911 Solitary pulmonary nodule: Secondary | ICD-10-CM

## 2018-08-06 DIAGNOSIS — Z01818 Encounter for other preprocedural examination: Secondary | ICD-10-CM | POA: Diagnosis not present

## 2018-08-06 DIAGNOSIS — I2510 Atherosclerotic heart disease of native coronary artery without angina pectoris: Secondary | ICD-10-CM | POA: Diagnosis not present

## 2018-08-06 LAB — BLOOD GAS, ARTERIAL
Acid-Base Excess: 1.7 mmol/L (ref 0.0–2.0)
Bicarbonate: 25.8 mmol/L (ref 20.0–28.0)
Drawn by: 470591
FIO2: 21
O2 Saturation: 94.2 %
Patient temperature: 98.6
pCO2 arterial: 41.1 mmHg (ref 32.0–48.0)
pH, Arterial: 7.415 (ref 7.350–7.450)
pO2, Arterial: 85.8 mmHg (ref 83.0–108.0)

## 2018-08-06 LAB — URINALYSIS, ROUTINE W REFLEX MICROSCOPIC
Bilirubin Urine: NEGATIVE
Glucose, UA: NEGATIVE mg/dL
Hgb urine dipstick: NEGATIVE
Ketones, ur: NEGATIVE mg/dL
Leukocytes, UA: NEGATIVE
Nitrite: NEGATIVE
Protein, ur: NEGATIVE mg/dL
Specific Gravity, Urine: 1.013 (ref 1.005–1.030)
pH: 6 (ref 5.0–8.0)

## 2018-08-06 LAB — COMPREHENSIVE METABOLIC PANEL
ALK PHOS: 64 U/L (ref 38–126)
ALT: 23 U/L (ref 0–44)
AST: 33 U/L (ref 15–41)
Albumin: 4.4 g/dL (ref 3.5–5.0)
Anion gap: 11 (ref 5–15)
BUN: 18 mg/dL (ref 8–23)
CALCIUM: 9.6 mg/dL (ref 8.9–10.3)
CO2: 23 mmol/L (ref 22–32)
Chloride: 106 mmol/L (ref 98–111)
Creatinine, Ser: 1.23 mg/dL (ref 0.61–1.24)
GFR calc non Af Amer: 58 mL/min — ABNORMAL LOW (ref >60)
Glucose, Bld: 107 mg/dL — ABNORMAL HIGH (ref 70–99)
Potassium: 4.2 mmol/L (ref 3.5–5.1)
SODIUM: 140 mmol/L (ref 135–145)
Total Bilirubin: 1.3 mg/dL — ABNORMAL HIGH (ref 0.3–1.2)
Total Protein: 6.7 g/dL (ref 6.5–8.1)

## 2018-08-06 LAB — CBC
HCT: 39.9 % (ref 39.0–52.0)
Hemoglobin: 13.7 g/dL (ref 13.0–17.0)
MCH: 30.4 pg (ref 26.0–34.0)
MCHC: 34.3 g/dL (ref 30.0–36.0)
MCV: 88.5 fL (ref 80.0–100.0)
Platelets: 137 10*3/uL — ABNORMAL LOW (ref 150–400)
RBC: 4.51 MIL/uL (ref 4.22–5.81)
RDW: 12 % (ref 11.5–15.5)
WBC: 6.8 10*3/uL (ref 4.0–10.5)
nRBC: 0 % (ref 0.0–0.2)

## 2018-08-06 LAB — SURGICAL PCR SCREEN
MRSA, PCR: NEGATIVE
Staphylococcus aureus: NEGATIVE

## 2018-08-06 LAB — ABO/RH: ABO/RH(D): O POS

## 2018-08-06 LAB — PROTIME-INR
INR: 1.05
Prothrombin Time: 13.6 seconds (ref 11.4–15.2)

## 2018-08-06 LAB — APTT: aPTT: 35 seconds (ref 24–36)

## 2018-08-06 NOTE — Progress Notes (Signed)
PCP - Dr. Yaakov Guthrie  Cardiologist - Denies  Chest x-[place] - Denies  EKG - 08/06/18  Stress Test - Denies  ECHO - Denies  Cardiac Cath - age 72, neg  AICD- na PM- na LOOP- na  Sleep Study - Denies CPAP - None  LABS- 08/06/18: CBC, CMP, PT, PTT, ABG, T/S, UA, PCR  ASA- Denies   Anesthesia- No  Pt denies having chest pain, sob, or fever at this time. All instructions explained to the pt, with a verbal understanding of the material. Pt agrees to go over the instructions while at home for a better understanding. The opportunity to ask questions was provided.

## 2018-08-07 NOTE — Anesthesia Preprocedure Evaluation (Addendum)
Anesthesia Evaluation  Patient identified by MRN, date of birth, ID band Patient awake    Reviewed: Allergy & Precautions, NPO status , Patient's Chart, lab work & pertinent test results  History of Anesthesia Complications Negative for: history of anesthetic complications  Airway Mallampati: II  TM Distance: >3 FB Neck ROM: Full    Dental  (+) Teeth Intact, Dental Advisory Given   Pulmonary neg COPD,  LLL nodule   Pulmonary exam normal breath sounds clear to auscultation       Cardiovascular negative cardio ROS Normal cardiovascular exam Rhythm:Regular Rate:Normal     Neuro/Psych negative neurological ROS     GI/Hepatic Neg liver ROS, GERD  Medicated,Hx of colon ca s/p partial colectomy 2018   Endo/Other  negative endocrine ROS  Renal/GU negative Renal ROS     Musculoskeletal negative musculoskeletal ROS (+)   Abdominal   Peds  Hematology negative hematology ROS (+)   Anesthesia Other Findings Day of surgery medications reviewed with the patient.  Reproductive/Obstetrics                            Anesthesia Physical Anesthesia Plan  ASA: II  Anesthesia Plan: General   Post-op Pain Management:    Induction: Intravenous  PONV Risk Score and Plan: 2 and Treatment may vary due to age or medical condition, Ondansetron, Dexamethasone and Midazolam  Airway Management Planned: Oral ETT and Double Lumen EBT  Additional Equipment: Arterial line and CVP  Intra-op Plan:   Post-operative Plan: Extubation in OR  Informed Consent: I have reviewed the patients History and Physical, chart, labs and discussed the procedure including the risks, benefits and alternatives for the proposed anesthesia with the patient or authorized representative who has indicated his/her understanding and acceptance.   Dental advisory given  Plan Discussed with: CRNA  Anesthesia Plan Comments:         Anesthesia Quick Evaluation

## 2018-08-08 ENCOUNTER — Inpatient Hospital Stay (HOSPITAL_COMMUNITY): Payer: Medicare Other | Admitting: Certified Registered Nurse Anesthetist

## 2018-08-08 ENCOUNTER — Encounter (HOSPITAL_COMMUNITY): Payer: Self-pay

## 2018-08-08 ENCOUNTER — Inpatient Hospital Stay (HOSPITAL_COMMUNITY): Payer: Medicare Other

## 2018-08-08 ENCOUNTER — Inpatient Hospital Stay (HOSPITAL_COMMUNITY)
Admission: RE | Admit: 2018-08-08 | Discharge: 2018-08-11 | DRG: 164 | Disposition: A | Discharge status: Home / Self Care | Payer: Medicare Other | Attending: Thoracic Surgery (Cardiothoracic Vascular Surgery) | Admitting: Thoracic Surgery (Cardiothoracic Vascular Surgery)

## 2018-08-08 ENCOUNTER — Other Ambulatory Visit: Payer: Self-pay

## 2018-08-08 ENCOUNTER — Encounter (HOSPITAL_COMMUNITY)
Admission: RE | Disposition: A | Discharge status: Home / Self Care | Payer: Self-pay | Source: Home / Self Care | Attending: Thoracic Surgery (Cardiothoracic Vascular Surgery)

## 2018-08-08 DIAGNOSIS — C3432 Malignant neoplasm of lower lobe, left bronchus or lung: Secondary | ICD-10-CM | POA: Diagnosis present

## 2018-08-08 DIAGNOSIS — J9811 Atelectasis: Secondary | ICD-10-CM | POA: Diagnosis not present

## 2018-08-08 DIAGNOSIS — R911 Solitary pulmonary nodule: Secondary | ICD-10-CM | POA: Diagnosis present

## 2018-08-08 DIAGNOSIS — C7802 Secondary malignant neoplasm of left lung: Secondary | ICD-10-CM | POA: Diagnosis present

## 2018-08-08 DIAGNOSIS — C187 Malignant neoplasm of sigmoid colon: Secondary | ICD-10-CM | POA: Diagnosis not present

## 2018-08-08 DIAGNOSIS — Z85038 Personal history of other malignant neoplasm of large intestine: Secondary | ICD-10-CM | POA: Diagnosis not present

## 2018-08-08 DIAGNOSIS — J939 Pneumothorax, unspecified: Secondary | ICD-10-CM

## 2018-08-08 DIAGNOSIS — Z902 Acquired absence of lung [part of]: Secondary | ICD-10-CM | POA: Diagnosis not present

## 2018-08-08 DIAGNOSIS — Z4682 Encounter for fitting and adjustment of non-vascular catheter: Secondary | ICD-10-CM | POA: Diagnosis not present

## 2018-08-08 DIAGNOSIS — I2510 Atherosclerotic heart disease of native coronary artery without angina pectoris: Secondary | ICD-10-CM | POA: Diagnosis present

## 2018-08-08 DIAGNOSIS — Z9221 Personal history of antineoplastic chemotherapy: Secondary | ICD-10-CM | POA: Diagnosis not present

## 2018-08-08 DIAGNOSIS — Z79899 Other long term (current) drug therapy: Secondary | ICD-10-CM | POA: Diagnosis not present

## 2018-08-08 DIAGNOSIS — R918 Other nonspecific abnormal finding of lung field: Secondary | ICD-10-CM | POA: Diagnosis present

## 2018-08-08 DIAGNOSIS — Z09 Encounter for follow-up examination after completed treatment for conditions other than malignant neoplasm: Secondary | ICD-10-CM

## 2018-08-08 HISTORY — PX: VIDEO ASSISTED THORACOSCOPY (VATS)/WEDGE RESECTION: SHX6174

## 2018-08-08 LAB — PREPARE RBC (CROSSMATCH)

## 2018-08-08 SURGERY — VIDEO ASSISTED THORACOSCOPY (VATS)/WEDGE RESECTION
Anesthesia: General | Site: Chest | Laterality: Left

## 2018-08-08 MED ORDER — BUPIVACAINE HCL (PF) 0.5 % IJ SOLN
INTRAMUSCULAR | Status: DC | PRN
  Administered 2018-08-08: 30 mL

## 2018-08-08 MED ORDER — SODIUM CHLORIDE 0.9 % IV SOLN
INTRAVENOUS | Status: DC
  Administered 2018-08-08: 14:00:00 via INTRAVENOUS

## 2018-08-08 MED ORDER — SODIUM CHLORIDE 0.9% FLUSH: 9.0000 mL | INTRAVENOUS | Status: DC | PRN

## 2018-08-08 MED ORDER — CEFAZOLIN SODIUM-DEXTROSE 2-4 GM/100ML-% IV SOLN
2.0000 g | INTRAVENOUS | Status: AC
  Administered 2018-08-08: 2 g via INTRAVENOUS
  Filled 2018-08-08: qty 100

## 2018-08-08 MED ORDER — FENTANYL CITRATE (PF) 250 MCG/5ML IJ SOLN
INTRAMUSCULAR | Status: AC
  Filled 2018-08-08: qty 5

## 2018-08-08 MED ORDER — ALBUTEROL SULFATE (2.5 MG/3ML) 0.083% IN NEBU
2.5000 mg | INHALATION_SOLUTION | RESPIRATORY_TRACT | Status: DC
  Administered 2018-08-08: 2.5 mg via RESPIRATORY_TRACT
  Filled 2018-08-08: qty 3

## 2018-08-08 MED ORDER — MIDAZOLAM HCL 5 MG/5ML IJ SOLN
INTRAMUSCULAR | Status: DC | PRN
  Administered 2018-08-08: 2 mg via INTRAVENOUS

## 2018-08-08 MED ORDER — LIDOCAINE 2% (20 MG/ML) 5 ML SYRINGE
INTRAMUSCULAR | Status: DC | PRN
  Administered 2018-08-08: 100 mg via INTRAVENOUS

## 2018-08-08 MED ORDER — SODIUM CHLORIDE (PF) 0.9 % IJ SOLN
INTRAMUSCULAR | Status: DC | PRN
  Administered 2018-08-08: 50 mL via INTRAVENOUS

## 2018-08-08 MED ORDER — OXYCODONE HCL 5 MG/5ML PO SOLN: 5.0000 mg | Freq: Once | ORAL | Status: AC | PRN

## 2018-08-08 MED ORDER — BUPIVACAINE LIPOSOME 1.3 % IJ SUSP
INTRAMUSCULAR | Status: DC | PRN
  Administered 2018-08-08: 08:00:00

## 2018-08-08 MED ORDER — ENOXAPARIN SODIUM 40 MG/0.4ML ~~LOC~~ SOLN
40.0000 mg | SUBCUTANEOUS | Status: DC
  Administered 2018-08-09 – 2018-08-11 (×3): 40 mg via SUBCUTANEOUS
  Filled 2018-08-08 (×3): qty 0.4

## 2018-08-08 MED ORDER — DEXAMETHASONE SODIUM PHOSPHATE 10 MG/ML IJ SOLN
INTRAMUSCULAR | Status: DC | PRN
  Administered 2018-08-08: 10 mg via INTRAVENOUS

## 2018-08-08 MED ORDER — FENTANYL 40 MCG/ML IV SOLN
INTRAVENOUS | Status: DC
  Administered 2018-08-08: 0 ug via INTRAVENOUS
  Administered 2018-08-08: 1000 ug via INTRAVENOUS
  Administered 2018-08-09 – 2018-08-10 (×6): 0 ug via INTRAVENOUS
  Filled 2018-08-08: qty 25

## 2018-08-08 MED ORDER — ONDANSETRON HCL 4 MG/2ML IJ SOLN: 4.0000 mg | Freq: Four times a day (QID) | INTRAMUSCULAR | Status: DC | PRN

## 2018-08-08 MED ORDER — FENTANYL CITRATE (PF) 100 MCG/2ML IJ SOLN
INTRAMUSCULAR | Status: DC | PRN
  Administered 2018-08-08: 50 ug via INTRAVENOUS
  Administered 2018-08-08 (×2): 100 ug via INTRAVENOUS
  Administered 2018-08-08: 50 ug via INTRAVENOUS

## 2018-08-08 MED ORDER — SODIUM CHLORIDE 0.9 % IV SOLN
INTRAVENOUS | Status: DC | PRN
  Administered 2018-08-08: 25 ug/min via INTRAVENOUS

## 2018-08-08 MED ORDER — POTASSIUM CHLORIDE 10 MEQ/50ML IV SOLN: 10.0000 meq | Freq: Every day | INTRAVENOUS | Status: DC | PRN

## 2018-08-08 MED ORDER — ACETAMINOPHEN 10 MG/ML IV SOLN: 1000.0000 mg | Freq: Once | INTRAVENOUS | Status: DC | PRN

## 2018-08-08 MED ORDER — HYDROMORPHONE HCL 1 MG/ML IJ SOLN: 0.2500 mg | INTRAMUSCULAR | Status: DC | PRN

## 2018-08-08 MED ORDER — NALOXONE HCL 0.4 MG/ML IJ SOLN: 0.4000 mg | INTRAMUSCULAR | Status: DC | PRN

## 2018-08-08 MED ORDER — FENTANYL CITRATE (PF) 100 MCG/2ML IJ SOLN
INTRAMUSCULAR | Status: AC
  Filled 2018-08-08: qty 2

## 2018-08-08 MED ORDER — ACETAMINOPHEN 500 MG PO TABS: 1000.0000 mg | ORAL_TABLET | Freq: Four times a day (QID) | ORAL | Status: DC

## 2018-08-08 MED ORDER — SENNOSIDES-DOCUSATE SODIUM 8.6-50 MG PO TABS
1.0000 | ORAL_TABLET | Freq: Every day | ORAL | Status: DC
  Administered 2018-08-08: 1 via ORAL
  Filled 2018-08-08 (×2): qty 1

## 2018-08-08 MED ORDER — OXYCODONE HCL 5 MG PO TABS
5.0000 mg | ORAL_TABLET | Freq: Once | ORAL | Status: AC | PRN
  Administered 2018-08-08: 5 mg via ORAL

## 2018-08-08 MED ORDER — TRAMADOL HCL 50 MG PO TABS: 50.0000 mg | ORAL_TABLET | Freq: Four times a day (QID) | ORAL | Status: DC | PRN

## 2018-08-08 MED ORDER — DIPHENHYDRAMINE HCL 50 MG/ML IJ SOLN: 12.5000 mg | Freq: Four times a day (QID) | INTRAMUSCULAR | Status: DC | PRN

## 2018-08-08 MED ORDER — ACETAMINOPHEN 160 MG/5ML PO SOLN: 1000.0000 mg | Freq: Four times a day (QID) | ORAL | Status: DC

## 2018-08-08 MED ORDER — PROPOFOL 10 MG/ML IV BOLUS
INTRAVENOUS | Status: DC | PRN
  Administered 2018-08-08: 180 mg via INTRAVENOUS

## 2018-08-08 MED ORDER — BUPIVACAINE HCL (PF) 0.5 % IJ SOLN
INTRAMUSCULAR | Status: AC
  Filled 2018-08-08: qty 30

## 2018-08-08 MED ORDER — DIPHENHYDRAMINE HCL 12.5 MG/5ML PO ELIX
12.5000 mg | ORAL_SOLUTION | Freq: Four times a day (QID) | ORAL | Status: DC | PRN
  Filled 2018-08-08: qty 5

## 2018-08-08 MED ORDER — PANTOPRAZOLE SODIUM 40 MG PO TBEC
40.0000 mg | DELAYED_RELEASE_TABLET | Freq: Every day | ORAL | Status: DC
  Administered 2018-08-08 – 2018-08-11 (×4): 40 mg via ORAL
  Filled 2018-08-08 (×4): qty 1

## 2018-08-08 MED ORDER — SODIUM CHLORIDE 0.9 % IV SOLN: 10.0000 mL/h | Freq: Once | INTRAVENOUS | Status: DC

## 2018-08-08 MED ORDER — ALBUTEROL SULFATE (2.5 MG/3ML) 0.083% IN NEBU: 2.5000 mg | INHALATION_SOLUTION | RESPIRATORY_TRACT | Status: DC

## 2018-08-08 MED ORDER — OXYCODONE HCL 5 MG PO TABS
ORAL_TABLET | ORAL | Status: AC
  Filled 2018-08-08: qty 1

## 2018-08-08 MED ORDER — LACTATED RINGERS IV SOLN
INTRAVENOUS | Status: DC
  Administered 2018-08-08 (×3): via INTRAVENOUS

## 2018-08-08 MED ORDER — OXYCODONE HCL 5 MG PO TABS
5.0000 mg | ORAL_TABLET | ORAL | Status: DC | PRN
  Administered 2018-08-10: 10 mg via ORAL
  Filled 2018-08-08: qty 2

## 2018-08-08 MED ORDER — ONDANSETRON HCL 4 MG/2ML IJ SOLN
INTRAMUSCULAR | Status: DC | PRN
  Administered 2018-08-08: 4 mg via INTRAVENOUS

## 2018-08-08 MED ORDER — PROPOFOL 10 MG/ML IV BOLUS
INTRAVENOUS | Status: AC
  Filled 2018-08-08: qty 20

## 2018-08-08 MED ORDER — MIDAZOLAM HCL 2 MG/2ML IJ SOLN
INTRAMUSCULAR | Status: AC
  Filled 2018-08-08: qty 6

## 2018-08-08 MED ORDER — ROCURONIUM BROMIDE 50 MG/5ML IV SOSY
PREFILLED_SYRINGE | INTRAVENOUS | Status: DC | PRN
  Administered 2018-08-08 (×2): 50 mg via INTRAVENOUS

## 2018-08-08 MED ORDER — LIDOCAINE HCL (CARDIAC) PF 100 MG/5ML IV SOSY: PREFILLED_SYRINGE | INTRAVENOUS | Status: DC | PRN

## 2018-08-08 MED ORDER — SUGAMMADEX SODIUM 200 MG/2ML IV SOLN
INTRAVENOUS | Status: DC | PRN
  Administered 2018-08-08: 200 mg via INTRAVENOUS

## 2018-08-08 MED ORDER — ACETAMINOPHEN 500 MG PO TABS
1000.0000 mg | ORAL_TABLET | Freq: Four times a day (QID) | ORAL | Status: DC
  Administered 2018-08-08 – 2018-08-11 (×10): 1000 mg via ORAL
  Filled 2018-08-08 (×11): qty 2

## 2018-08-08 MED ORDER — MIDAZOLAM HCL 2 MG/2ML IJ SOLN
INTRAMUSCULAR | Status: AC
  Filled 2018-08-08: qty 2

## 2018-08-08 MED ORDER — 0.9 % SODIUM CHLORIDE (POUR BTL) OPTIME
TOPICAL | Status: DC | PRN
  Administered 2018-08-08: 2000 mL

## 2018-08-08 MED ORDER — CEFAZOLIN SODIUM-DEXTROSE 2-4 GM/100ML-% IV SOLN
2.0000 g | Freq: Three times a day (TID) | INTRAVENOUS | Status: AC
  Administered 2018-08-08 (×2): 2 g via INTRAVENOUS
  Filled 2018-08-08 (×2): qty 100

## 2018-08-08 MED ORDER — ALBUTEROL SULFATE (2.5 MG/3ML) 0.083% IN NEBU: 2.5000 mg | INHALATION_SOLUTION | RESPIRATORY_TRACT | Status: DC | PRN

## 2018-08-08 MED ORDER — BISACODYL 5 MG PO TBEC: 10.0000 mg | DELAYED_RELEASE_TABLET | Freq: Every day | ORAL | Status: DC

## 2018-08-08 MED ORDER — ATORVASTATIN CALCIUM 10 MG PO TABS
20.0000 mg | ORAL_TABLET | Freq: Every evening | ORAL | Status: DC
  Administered 2018-08-09 – 2018-08-10 (×2): 20 mg via ORAL
  Filled 2018-08-08 (×2): qty 2

## 2018-08-08 MED ORDER — PHENYLEPHRINE 40 MCG/ML (10ML) SYRINGE FOR IV PUSH (FOR BLOOD PRESSURE SUPPORT)
PREFILLED_SYRINGE | INTRAVENOUS | Status: AC
  Filled 2018-08-08: qty 10

## 2018-08-08 MED ORDER — PROMETHAZINE HCL 25 MG/ML IJ SOLN: 6.2500 mg | INTRAMUSCULAR | Status: DC | PRN

## 2018-08-08 MED ORDER — BISACODYL 5 MG PO TBEC
10.0000 mg | DELAYED_RELEASE_TABLET | Freq: Every day | ORAL | Status: DC
  Administered 2018-08-08 – 2018-08-09 (×2): 10 mg via ORAL
  Filled 2018-08-08 (×2): qty 2

## 2018-08-08 SURGICAL SUPPLY — 88 items
ADH SKN CLS APL DERMABOND .7 (GAUZE/BANDAGES/DRESSINGS) ×1
BAG SPEC RTRVL LRG 6X4 10 (ENDOMECHANICALS)
CANISTER SUCT 3000ML PPV (MISCELLANEOUS) ×3 IMPLANT
CATH THORACIC 28FR (CATHETERS) ×1 IMPLANT
CATH THORACIC 36FR (CATHETERS) IMPLANT
CATH THORACIC 36FR RT ANG (CATHETERS) IMPLANT
CLIP VESOCCLUDE MED 6/CT (CLIP) ×1 IMPLANT
CONN ST 1/4X3/8  BEN (MISCELLANEOUS)
CONN ST 1/4X3/8 BEN (MISCELLANEOUS) IMPLANT
CONN Y 3/8X3/8X3/8  BEN (MISCELLANEOUS)
CONN Y 3/8X3/8X3/8 BEN (MISCELLANEOUS) IMPLANT
CONT SPEC 4OZ CLIKSEAL STRL BL (MISCELLANEOUS) ×10 IMPLANT
COVER SURGICAL LIGHT HANDLE (MISCELLANEOUS) ×1 IMPLANT
COVER WAND RF STERILE (DRAPES) ×2 IMPLANT
DERMABOND ADVANCED (GAUZE/BANDAGES/DRESSINGS) ×1
DERMABOND ADVANCED .7 DNX12 (GAUZE/BANDAGES/DRESSINGS) ×1 IMPLANT
DRAIN CHANNEL 28F RND 3/8 FF (WOUND CARE) IMPLANT
DRAIN CHANNEL 32F RND 10.7 FF (WOUND CARE) IMPLANT
DRAPE LAPAROSCOPIC ABDOMINAL (DRAPES) ×2 IMPLANT
DRAPE WARM FLUID 44X44 (DRAPE) ×1 IMPLANT
ELECT BLADE 6.5 EXT (BLADE) ×2 IMPLANT
ELECT REM PT RETURN 9FT ADLT (ELECTROSURGICAL) ×2
ELECTRODE REM PT RTRN 9FT ADLT (ELECTROSURGICAL) ×1 IMPLANT
GAUZE SPONGE 4X4 12PLY STRL (GAUZE/BANDAGES/DRESSINGS) IMPLANT
GAUZE SPONGE 4X4 12PLY STRL LF (GAUZE/BANDAGES/DRESSINGS) ×1 IMPLANT
GLOVE SURG SIGNA 7.5 PF LTX (GLOVE) ×4 IMPLANT
GOWN STRL REUS W/ TWL LRG LVL3 (GOWN DISPOSABLE) ×2 IMPLANT
GOWN STRL REUS W/ TWL XL LVL3 (GOWN DISPOSABLE) ×2 IMPLANT
GOWN STRL REUS W/TWL LRG LVL3 (GOWN DISPOSABLE) ×6
GOWN STRL REUS W/TWL XL LVL3 (GOWN DISPOSABLE) ×2
HEMOSTAT SURGICEL 2X14 (HEMOSTASIS) IMPLANT
KIT BASIN OR (CUSTOM PROCEDURE TRAY) ×2 IMPLANT
KIT SUCTION CATH 14FR (SUCTIONS) ×2 IMPLANT
KIT TURNOVER KIT B (KITS) ×2 IMPLANT
NDL HYPO 25GX1X1/2 BEV (NEEDLE) ×1 IMPLANT
NDL SPNL 18GX3.5 QUINCKE PK (NEEDLE) IMPLANT
NDL SPNL 22GX3.5 QUINCKE BK (NEEDLE) ×1 IMPLANT
NEEDLE HYPO 25GX1X1/2 BEV (NEEDLE) ×2 IMPLANT
NEEDLE SPNL 18GX3.5 QUINCKE PK (NEEDLE) IMPLANT
NEEDLE SPNL 22GX3.5 QUINCKE BK (NEEDLE) ×2 IMPLANT
NS IRRIG 1000ML POUR BTL (IV SOLUTION) ×5 IMPLANT
PACK CHEST (CUSTOM PROCEDURE TRAY) ×2 IMPLANT
PAD ARMBOARD 7.5X6 YLW CONV (MISCELLANEOUS) ×4 IMPLANT
POUCH ENDO CATCH II 15MM (MISCELLANEOUS) IMPLANT
POUCH SPECIMEN RETRIEVAL 10MM (ENDOMECHANICALS) IMPLANT
RELOAD STAPLE 60 3.8 GOLD REG (STAPLE) IMPLANT
RELOAD STAPLER GOLD 60MM (STAPLE) ×3 IMPLANT
SCISSORS ENDO CVD 5DCS (MISCELLANEOUS) IMPLANT
SEALANT PROGEL (MISCELLANEOUS) IMPLANT
SEALANT SURG COSEAL 4ML (VASCULAR PRODUCTS) IMPLANT
SEALANT SURG COSEAL 8ML (VASCULAR PRODUCTS) IMPLANT
SHEARS HARMONIC HDI 20CM (ELECTROSURGICAL) IMPLANT
SOLUTION ANTI FOG 6CC (MISCELLANEOUS) ×2 IMPLANT
SPECIMEN JAR MEDIUM (MISCELLANEOUS) ×1 IMPLANT
SPONGE INTESTINAL PEANUT (DISPOSABLE) ×2 IMPLANT
SPONGE TONSIL TAPE 1 RFD (DISPOSABLE) ×2 IMPLANT
STAPLE ECHEON FLEX 60 POW ENDO (STAPLE) ×1 IMPLANT
STAPLER RELOAD GOLD 60MM (STAPLE) ×6
SUT PROLENE 4 0 RB 1 (SUTURE)
SUT PROLENE 4-0 RB1 .5 CRCL 36 (SUTURE) IMPLANT
SUT SILK  1 MH (SUTURE) ×1
SUT SILK 1 MH (SUTURE) ×2 IMPLANT
SUT SILK 1 TIES 10X30 (SUTURE) ×1 IMPLANT
SUT SILK 2 0 SH (SUTURE) IMPLANT
SUT SILK 2 0SH CR/8 30 (SUTURE) IMPLANT
SUT SILK 3 0 SH 30 (SUTURE) IMPLANT
SUT SILK 3 0SH CR/8 30 (SUTURE) ×1 IMPLANT
SUT VIC AB 0 CTX 27 (SUTURE) IMPLANT
SUT VIC AB 1 CTX 27 (SUTURE) ×2 IMPLANT
SUT VIC AB 2-0 CT1 27 (SUTURE)
SUT VIC AB 2-0 CT1 TAPERPNT 27 (SUTURE) IMPLANT
SUT VIC AB 2-0 CTX 36 (SUTURE) ×3 IMPLANT
SUT VIC AB 3-0 MH 27 (SUTURE) IMPLANT
SUT VIC AB 3-0 SH 27 (SUTURE)
SUT VIC AB 3-0 SH 27X BRD (SUTURE) IMPLANT
SUT VIC AB 3-0 X1 27 (SUTURE) ×2 IMPLANT
SUT VICRYL 0 UR6 27IN ABS (SUTURE) IMPLANT
SUT VICRYL 2 TP 1 (SUTURE) IMPLANT
SYR 30ML LL (SYRINGE) ×2 IMPLANT
SYSTEM SAHARA CHEST DRAIN ATS (WOUND CARE) ×2 IMPLANT
TAPE CLOTH SURG 4X10 WHT LF (GAUZE/BANDAGES/DRESSINGS) ×1 IMPLANT
TIP APPLICATOR SPRAY EXTEND 16 (VASCULAR PRODUCTS) IMPLANT
TOWEL GREEN STERILE (TOWEL DISPOSABLE) ×2 IMPLANT
TOWEL GREEN STERILE FF (TOWEL DISPOSABLE) ×2 IMPLANT
TRAY FOLEY MTR SLVR 16FR STAT (SET/KITS/TRAYS/PACK) ×2 IMPLANT
TROCAR XCEL BLADELESS 5X75MML (TROCAR) ×2 IMPLANT
TROCAR XCEL NON-BLD 5MMX100MML (ENDOMECHANICALS) IMPLANT
WATER STERILE IRR 1000ML POUR (IV SOLUTION) ×4 IMPLANT

## 2018-08-08 NOTE — Op Note (Signed)
NAME: [place], [place] MEDICAL RECORD [id number] ACCOUNT [id number] DATE OF BIRTH:February 26, 1946 FACILITY: [facility] LOCATION: [facility] PHYSICIAN:Shonette Rhames Chaya Jan, MD  OPERATIVE REPORT  DATE OF PROCEDURE:  08/08/2018  PREOPERATIVE DIAGNOSIS:  Left lower lobe lung nodule.  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  PROCEDURE:   Left video-assisted thoracoscopy, Wedge resection of left lower lobe nodule, Lymph node sampling.  SURGEON:  Modesto Charon, MD  ASSISTANT:  Jadene Pierini, PA-C  ANESTHESIA:  General.  FINDINGS:  Nodule easily palpable in posterior aspect of left lower lobe.  Frozen section revealed mucinous adenocarcinoma, normal-appearing nodes.  CLINICAL NOTE:  The patient is a 73 year old gentleman with a history of colon cancer.  He is a lifelong nonsmoker.  In July of 2019, a CT showed a 3 mm left lower lobe lung nodule.  A followup in November showed the nodule had increased in size to 11  mm.  There was mild uptake on PET-CT with an SUV of 1.7.  Due to the increase in size of the nodule, he was advised to undergo surgical resection.  The indications, risks, benefits, and alternatives were discussed in detail with the patient.  He  understood and accepted the risks and agreed to proceed.  OPERATIVE NOTE:  The patient was brought to the preoperative holding area on 08/08/2018.  Anesthesia placed a central line and an arterial blood pressure monitoring line.  He was taken to the operating room, anesthetized, and intubated with a double-lumen  endotracheal tube.  A Foley catheter was placed.  Sequential compression devices were placed on the calves for DVT prophylaxis.  Intravenous antibiotics were administered.  He was placed in a right lateral decubitus position, and the left chest was  prepped and draped in the usual sterile fashion.  Single lung ventilation of the right lung was initiated and was tolerated well  throughout the procedure.  A timeout was performed.  A solution containing 20 mL of liposomal bupivacaine, 30 mL of 0.5% bupivacaine, and 50 mL of saline was prepared.  This was used for local anesthesia at the incision sites as well as for the intercostal nerve blocks.  An area in  the 7th interspace in the mid axillary line was injected.  A small port-type incision was made, and a 5 mm port was inserted into the chest.  The thoracoscope was advanced into the chest.  There was good isolation of the left lung.  There was no pleural  effusion and no abnormality of the visceral or parietal pleura.  An area in the 5th interspace anterolaterally was then injected with the bupivacaine solution.  A 4 cm working incision was made.  No rib spreading was performed during the procedure.  The  inferior pulmonary ligament was divided with electrocautery.  A level 9 node was identified.  This was removed.  All lymph nodes that were encountered were sent for permanent pathology only.  The nodule was easily palpable in the posterior aspect of the  lower lobe.  Given its small size, level of PET activity, and peripheral location, it was felt that a wedge resection would be adequate, even if this did turn out to be a lung primary.  A wide wedge resection was performed with 3 cm gross margins using  sequential firings of an Echelon 60 mm stapler with gold cartridges.  The specimen was removed and sent for frozen section of the mass which later returned showing mucinous adenocarcinoma.  It was not known if this  was a lung primary or metastatic  colon cancer.  The pleural reflection was divided at the hilum both anteriorly and posteriorly.  Posterior level 7 nodes were removed. Anteriorly, level 10 and 11 nodes were encountered and removed.  The pleural reflection was divided over the  aortopulmonary window.  There was an extensive fat pad, but no definite node was found in that location.  The chest was copiously irrigated  with warm saline.  A test inflation to 30 cm of water revealed no air leakage from the staple line.  A 28-French  chest tube was placed through the original port incision and secured to skin with a #1 silk suture.  Dual-lung ventilation was resumed.  The working incision was closed in 3 layers.  All sponge, needle and instrument counts were correct at the end of the  procedure.  The patient was extubated in the operating room and taken to the [facility] in good condition.  LN/NUANCE  D:08/08/2018 T:08/08/2018 JOB:[id number]

## 2018-08-08 NOTE — Anesthesia Procedure Notes (Addendum)
Procedure Name: Intubation Date/Time: 08/08/2018 8:10 AM Performed by: Roderic Palau, MD Pre-anesthesia Checklist: Patient identified, Emergency Drugs available, Suction available, Patient being monitored and Timeout performed Patient Re-evaluated:Patient Re-evaluated prior to induction Oxygen Delivery Method: Circle system utilized Preoxygenation: Pre-oxygenation with 100% oxygen Induction Type: IV induction Ventilation: Mask ventilation without difficulty Laryngoscope Size: Miller and 2 Grade View: Grade II Tube type: Oral Endobronchial tube: Left and Double lumen EBT and 37 Fr Number of attempts: 3 Airway Equipment and Method: Stylet Placement Confirmation: ETT inserted through vocal cords under direct vision,  positive ETCO2 and breath sounds checked- equal and bilateral Tube secured with: Tape Dental Injury: Teeth and Oropharynx as per pre-operative assessment  Difficulty Due To: Difficulty was anticipated, Difficult Airway- due to anterior larynx and Difficult Airway- due to limited oral opening Comments: DL x 1 with MAC 4 blade with good view but DLT unable to make curve and pass easily through vocal cords.  DL x 1 with Sabra Heck 2 by Dr Daiva Huge with same view, same issue, ETT passed through cords but unable to advance DLT.  Dr Ola Spurr used Sabra Heck 2 on 3rd attempt and was able to pass DLT with some resistance.  Good isolation noted after pt turned and left lung isolated.  Henderson Cloud, CRNA

## 2018-08-08 NOTE — Anesthesia Procedure Notes (Signed)
Central Venous Catheter Insertion Performed by: Brennan Bailey, MD, anesthesiologist Start/End1/09/2018 7:20 AM, 08/08/2018 7:25 AM Patient location: Pre-op. Preanesthetic checklist: patient identified, IV checked, site marked, risks and benefits discussed, surgical consent, monitors and equipment checked, pre-op evaluation, timeout performed and anesthesia consent Lidocaine 1% used for infiltration and patient sedated Hand hygiene performed  and maximum sterile barriers used  Catheter size: 8 Fr Total catheter length 16. Central line was placed.Double lumen Procedure performed using ultrasound guided technique. Ultrasound Notes:image(s) printed for medical record Attempts: 1 Following insertion, dressing applied and line sutured. Post procedure assessment: blood return through all ports  Patient tolerated the procedure well with no immediate complications.

## 2018-08-08 NOTE — Interval H&P Note (Signed)
History and Physical Interval Note:  08/08/2018 7:49 AM  [facility]  has presented today for surgery, with the diagnosis of LLL NODULE  The various methods of treatment have been discussed with the patient and family. After consideration of risks, benefits and other options for treatment, the patient has consented to  Procedure(s): VIDEO ASSISTED THORACOSCOPY (VATS)/WEDGE RESECTION (Left) as a surgical intervention .  The patient's history has been reviewed, patient examined, no change in status, stable for surgery.  I have reviewed the patient's chart and labs.  Questions were answered to the patient's satisfaction.     [name]

## 2018-08-08 NOTE — Anesthesia Procedure Notes (Signed)
Arterial Line Insertion Start/End1/09/2018 7:00 AM, 08/08/2018 7:10 AM Performed by: Lavell Luster, CRNA, CRNA  Preanesthetic checklist: patient identified, IV checked, site marked, risks and benefits discussed, surgical consent, monitors and equipment checked, pre-op evaluation and timeout performed Lidocaine 1% used for infiltration Left, radial was placed Catheter size: 20 G Hand hygiene performed , maximum sterile barriers used  and Seldinger technique used Allen's test indicative of satisfactory collateral circulation Attempts: 1 Procedure performed without using ultrasound guided technique. Following insertion, Biopatch and dressing applied. Post procedure assessment: normal  Patient tolerated the procedure well with no immediate complications.

## 2018-08-08 NOTE — Anesthesia Postprocedure Evaluation (Signed)
Anesthesia Post Note  Patient: [place]  Procedure(s) Performed: VIDEO ASSISTED THORACOSCOPY (VATS)/WEDGE RESECTION LEFT LOWER LOBE, LYMPH NODE DISSECTION (Left Chest)     Patient location during evaluation: PACU Anesthesia Type: General Level of consciousness: awake and alert Pain management: pain level controlled Vital Signs Assessment: post-procedure vital signs reviewed and stable Respiratory status: spontaneous breathing, nonlabored ventilation and respiratory function stable Cardiovascular status: blood pressure returned to baseline and stable Postop Assessment: no apparent nausea or vomiting Anesthetic complications: no    Last Vitals:  Vitals:   08/08/18 1159 08/08/18 1212  BP: (!) 151/82   Pulse: 75   Resp: 18 20  Temp: 36.9 C   SpO2: 100% 100%    Last Pain:  Vitals:   08/08/18 1212  TempSrc:   PainSc: 0-No pain                 Brennan Bailey

## 2018-08-08 NOTE — Brief Op Note (Addendum)
08/08/2018  10:12 AM  PATIENT:  Jack Gray  73 y.o. male  PRE-OPERATIVE DIAGNOSIS:  LLL NODULE  POST-OPERATIVE DIAGNOSIS: Adenocarcinoma, possible metastatic colon cancer  PROCEDURE:  Procedure(s): VIDEO ASSISTED THORACOSCOPY (VATS)/WEDGE RESECTION LEFT LOWER LOBE, LYMPH NODE DISSECTION (Left) (SAMPLING)  SURGEON:  Surgeon(s) and Role:    * [name], MD - Primary  PHYSICIAN ASSISTANT: WAYNE GOLD PA-C   ANESTHESIA:   general  EBL:  100 mL   BLOOD ADMINISTERED:none  DRAINS: 1 28 FRENCH Chest Tube(s) in the LEFT HEMITHORAX   LOCAL MEDICATIONS USED:  BUPIVICAINE   SPECIMEN:  Source of Specimen:  LLL WEDGE RESECTION, LN SAMPLES  DISPOSITION OF SPECIMEN:  PATHOLOGY  COUNTS:  YES  TOURNIQUET:  * No tourniquets in log *  DICTATION: .Other Dictation: Dictation Number [id number]  PLAN OF CARE: Admit to inpatient   PATIENT DISPOSITION:  PACU STABLE   Delay start of Pharmacological VTE agent (>24hrs) due to surgical blood loss or risk of bleeding: no  FROZEN ADENOCARCINOMA WITH MUCINOUS CHANGES  COMPLICATIONS: NO KNOWN

## 2018-08-08 NOTE — Transfer of Care (Signed)
Immediate Anesthesia Transfer of Care Note  Patient: Jack Gray  Procedure(s) Performed: VIDEO ASSISTED THORACOSCOPY (VATS)/WEDGE RESECTION LEFT LOWER LOBE, LYMPH NODE DISSECTION (Left Chest)  Patient Location: PACU  Anesthesia Type:General  Level of Consciousness: awake, alert  and oriented  Airway & Oxygen Therapy: Patient connected to face mask oxygen  Post-op Assessment: Post -op Vital signs reviewed and stable  Post vital signs: stable  Last Vitals:  Vitals Value Taken Time  BP 136/81 08/08/2018 10:19 AM  Temp    Pulse 89 08/08/2018 10:21 AM  Resp 21 08/08/2018 10:21 AM  SpO2 95 % 08/08/2018 10:21 AM  Vitals shown include unvalidated device data.  Last Pain:  Vitals:   08/08/18 0642  TempSrc:   PainSc: 0-No pain         Complications: No apparent anesthesia complications

## 2018-08-09 ENCOUNTER — Encounter (HOSPITAL_COMMUNITY): Payer: Self-pay | Admitting: Thoracic Surgery (Cardiothoracic Vascular Surgery)

## 2018-08-09 ENCOUNTER — Inpatient Hospital Stay (HOSPITAL_COMMUNITY): Payer: Medicare Other

## 2018-08-09 DIAGNOSIS — C187 Malignant neoplasm of sigmoid colon: Secondary | ICD-10-CM

## 2018-08-09 DIAGNOSIS — Z9221 Personal history of antineoplastic chemotherapy: Secondary | ICD-10-CM

## 2018-08-09 DIAGNOSIS — Z902 Acquired absence of lung [part of]: Secondary | ICD-10-CM

## 2018-08-09 DIAGNOSIS — R911 Solitary pulmonary nodule: Secondary | ICD-10-CM

## 2018-08-09 LAB — BASIC METABOLIC PANEL
Anion gap: 5 (ref 5–15)
BUN: 15 mg/dL (ref 8–23)
CO2: 26 mmol/L (ref 22–32)
Calcium: 8.7 mg/dL — ABNORMAL LOW (ref 8.9–10.3)
Chloride: 109 mmol/L (ref 98–111)
Creatinine, Ser: 1.29 mg/dL — ABNORMAL HIGH (ref 0.61–1.24)
GFR calc Af Amer: >60 mL/min (ref >60)
GFR, EST NON AFRICAN AMERICAN: 55 mL/min — AB (ref >60)
Glucose, Bld: 139 mg/dL — ABNORMAL HIGH (ref 70–99)
Potassium: 3.6 mmol/L (ref 3.5–5.1)
Sodium: 140 mmol/L (ref 135–145)

## 2018-08-09 LAB — CBC
HCT: 34 % — ABNORMAL LOW (ref 39.0–52.0)
Hemoglobin: 11.8 g/dL — ABNORMAL LOW (ref 13.0–17.0)
MCH: 31.4 pg (ref 26.0–34.0)
MCHC: 34.7 g/dL (ref 30.0–36.0)
MCV: 90.4 fL (ref 80.0–100.0)
Platelets: 115 10*3/uL — ABNORMAL LOW (ref 150–400)
RBC: 3.76 MIL/uL — ABNORMAL LOW (ref 4.22–5.81)
RDW: 12.4 % (ref 11.5–15.5)
WBC: 9.7 10*3/uL (ref 4.0–10.5)
nRBC: 0 % (ref 0.0–0.2)

## 2018-08-09 MED ORDER — VITAMIN D 25 MCG (1000 UNIT) PO TABS
2000.0000 [IU] | ORAL_TABLET | Freq: Every day | ORAL | Status: DC
  Administered 2018-08-09 – 2018-08-11 (×3): 2000 [IU] via ORAL
  Filled 2018-08-09 (×3): qty 2

## 2018-08-09 MED ORDER — PROSIGHT PO TABS
1.0000 | ORAL_TABLET | Freq: Two times a day (BID) | ORAL | Status: DC
  Administered 2018-08-09 – 2018-08-11 (×5): 1 via ORAL
  Filled 2018-08-09 (×5): qty 1

## 2018-08-09 NOTE — Discharge Instructions (Signed)
Thoracoscopy, Care After  This sheet gives you information about how to care for yourself after your procedure. Your health care provider may also give you more specific instructions. If you have problems or questions, contact your health care provider.  What can I expect after the procedure?  After the procedure, it is common to have pain and soreness in the surgical area.  Follow these instructions at home:  Incision care     Follow instructions from your health care provider about how to take care of your incision. Make sure you:  ? Wash your hands with soap and water before you change your bandage (dressing). If soap and water are not available, use hand sanitizer.  ? Change your dressing as told by your health care provider.  ? Leave stitches (sutures), skin glue, or adhesive strips in place. These skin closures may need to stay in place for 2 weeks or longer. If adhesive strip edges start to loosen and curl up, you may trim the loose edges. Do not remove adhesive strips completely unless your health care provider tells you to do that.   Check your incision areas every day for signs of infection. Check for:  ? Redness, swelling, or pain.  ? Fluid or blood.  ? Warmth.  ? Pus or a bad smell.   Do not take baths, swim, or use a hot tub until your health care provider approves. You may take showers.  Medicines   Take over-the-counter and prescription medicines only as told by your health care provider.   If you were prescribed an antibiotic medicine, take it as told by your health care provider. Do not stop taking the antibiotic even if you start to feel better.   Do not drive or use heavy machinery while taking prescription pain medicine.   If you are taking prescription pain medicine, take actions to prevent or treat constipation. Your health care provider may recommend that you:  ? Drink enough fluid to keep your urine pale yellow.  ? Eat foods that are high in fiber, such as fresh fruits and vegetables,  whole grains, and beans.  ? Limit foods that are high in fat and processed sugars, such as fried and sweet foods.  ? Take an over-the-counter or prescription medicine for constipation.  Managing pain, stiffness, and swelling     If directed, put ice on the affected area:  ? Put ice in a plastic bag.  ? Place a towel between your skin and the bag.  ? Leave the ice on for 20 minutes, 2-3 times a day.  Preventing lung infection   To prevent pneumonia and to keep your lungs healthy:  ? Try to cough often. If it hurts to cough, hold a pillow against your chest as you cough.  ? Take deep breaths or do breathing exercises as instructed by your health care provider.  ? If you were given an incentive spirometer, use it as directed by your health care provider.  General instructions   Do not lift anything that is heavier than 10 lb (4.5 kg), or the limit that you are told, until your health care provider says that it is safe.   Do not use any products that contain nicotine or tobacco, such as cigarettes and e-cigarettes. These can delay healing after surgery. If you need help quitting, ask your health care provider.   Avoid driving until your health care provider approves.   If you have a chest drainage tube, care for it   as instructed by your health care provider. Do not travel by airplane after the chest drainage tube is removed until your health care provider approves.   Keep all follow-up visits as told by your health care provider. This is important.  Contact a health care provider if:   You have a fever.   Pain medicines do not ease your pain.   You have redness, swelling, or increasing pain in your incision area.   You develop a cough that does not go away, or you are coughing up mucus that is yellow or green.  Get help right away if:   You have fluid, blood, or pus coming from your incision.   There is a bad smell coming from your incision or dressing.   You develop a rash.   You cough up blood.   You  develop light-headedness, or you feel faint.   You have difficulty breathing.   You develop chest pain.   Your heartbeat feels irregular or very fast.  These symptoms may represent a serious problem that is an emergency. Do not wait to see if the symptoms will go away. Get medical help right away. Call your local emergency services (911 in the [place] Do not drive yourself to the hospital.  Summary   Follow instructions from your health care provider about how to take care of your incision.   Do not drive or use heavy machinery while taking prescription pain medicine.   Leave stitches (sutures), skin glue, or adhesive strips in place.   Check your incision areas every day for signs of infection.  This information is not intended to replace advice given to you by your health care provider. Make sure you discuss any questions you have with your health care provider.  Document Released: 02/10/2005 Document Revised: 07/03/2017 Document Reviewed: 07/03/2017  Elsevier Interactive Patient Education  2019 [employer]

## 2018-08-09 NOTE — Progress Notes (Signed)
1 Day Post-Op Procedure(s) (LRB): VIDEO ASSISTED THORACOSCOPY (VATS)/WEDGE RESECTION LEFT LOWER LOBE, LYMPH NODE DISSECTION (Left) Subjective: Mild incisional pain, no nausea  Objective: Vital signs in last 24 hours: Temp:  [97.3 F (36.3 C)-98.5 F (36.9 C)] 97.8 F (36.6 C) (01/03 0412) Pulse Rate:  [51-92] 51 (01/03 0412) Cardiac Rhythm: Normal sinus rhythm (01/02 1920) Resp:  [14-23] 14 (01/03 0412) BP: (104-159)/(65-99) 104/65 (01/03 0412) SpO2:  [96 %-100 %] 100 % (01/03 0412) Arterial Line BP: (161-187)/(64-85) 174/82 (01/02 1139) FiO2 (%):  [28 %] 28 % (01/02 1546)  Hemodynamic parameters for last 24 hours:    Intake/Output from previous day: 01/02 0701 - 01/03 0700 In: 3350 [P.O.:770; I.V.:2580] Out: 3329 [Urine:3585; Blood:100; Chest Tube:240] Intake/Output this shift: No intake/output data recorded.  General appearance: alert, cooperative and no distress Neurologic: intact Heart: regular rate and rhythm Lungs: clear to auscultation bilaterally Abdomen: normal findings: soft, non-tender no air leak  Lab Results: Recent Labs    08/06/18 0909  WBC 6.8  HGB 13.7  HCT 39.9  PLT 137*   BMET:  Recent Labs    08/06/18 0909  NA 140  K 4.2  CL 106  CO2 23  GLUCOSE 107*  BUN 18  CREATININE 1.23  CALCIUM 9.6    PT/INR:  Recent Labs    08/06/18 0909  LABPROT 13.6  INR 1.05   ABG    Component Value Date/Time   PHART 7.415 08/06/2018 0910   HCO3 25.8 08/06/2018 0910   TCO2 28 10/31/2009 2003   O2SAT 94.2 08/06/2018 0910   CBG (last 3)  No results for input(s): GLUCAP in the last 72 hours.  Assessment/Plan: S/P Procedure(s) (LRB): VIDEO ASSISTED THORACOSCOPY (VATS)/WEDGE RESECTION LEFT LOWER LOBE, LYMPH NODE DISSECTION (Left) Doing well POD # 1  No air leak- CT to water seal Tolerating regular diet Ambulating SCD + enoxaparin for DVT prophylaxis IV to KVO   LOS: 1 day    Jack Gray 08/09/2018

## 2018-08-09 NOTE — Consult Note (Signed)
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 : I just had surgery.  HPI: Mr. Jack Gray is well-known to me.  He is a really nice 74 year old white male.  We treated him for stage IIIb (T3N1cM0) adenocarcinoma the sigmoid colon.  He had adjuvant FOLFOX that was completed in February 2019.  As part of his routine surveillance, a chest x-Ange[place] was done back in November, this showed a new nodule in the left lung.  It measured 1.1 cm.  He had a PET scan done on 07/16/2018.  This showed a very slight uptake with an SUV of 1.8.  He was subsequently referred to Dr. Roxan Hockey of thoracic surgery.  Dr. Koleen Nimrod took him to surgery on 08/08/2018.  A VATS procedure was done.  A wedge resection of the left lower lung was accomplished with lymph node sampling.  Apparently, the frozen section revealed mucinous adenocarcinoma.  He is doing quite well.  He looks great.  He is always been in wonderful shape.  We will await the final path report.   Past Medical History:  Diagnosis Date  . Anemia    as a child  . Cancer of sigmoid colon ([place]) 05/30/2017  . Counseling regarding goals of care 06/18/2017  . History of kidney stones   . Pneumonia    as a child  :  Past Surgical History:  Procedure Laterality Date  . CARDIAC CATHETERIZATION     at age 36-negative  . cyctoscopy     for stones  . HERNIA REPAIR     r and left inguinal  30 years ago  . PARTIAL COLECTOMY Left 05/30/2017   Procedure: LAPAROSCOPIC ASSISTED LEFT COLECTOMY;  Surgeon: Fanny Skates, MD;  Location: [facility];  Service: General;  Laterality: Left;  GENERAL AND TAP BLOCK   . PORTACATH PLACEMENT Right 06/11/2017   Procedure: INSERTION PORT-A-CATH;  Surgeon: Fanny Skates, MD;  Location: [facility];  Service: General;  Laterality: Right;  . SHOULDER SURGERY     Bil  . VIDEO ASSISTED THORACOSCOPY (VATS)/WEDGE RESECTION  Left 08/08/2018   Procedure: VIDEO ASSISTED THORACOSCOPY (VATS)/WEDGE RESECTION LEFT LOWER LOBE, LYMPH NODE DISSECTION;  Surgeon: [name], MD;  Location: [facility];  Service: Thoracic;  Laterality: Left;  :   Current Facility-Administered Medications:  .  0.9 %  sodium chloride infusion, , Intravenous, Continuous, [name], MD, Last Rate: 10 mL/hr at 08/09/18 1448 .  acetaminophen (TYLENOL) tablet 1,000 mg, 1,000 mg, Oral, Q6H, 1,000 mg at 08/09/18 0553 **OR** acetaminophen (TYLENOL) solution 1,000 mg, 1,000 mg, Oral, Q6H, [name], MD .  albuterol (PROVENTIL) (2.5 MG/3ML) 0.083% nebulizer solution 2.5 mg, 2.5 mg, Nebulization, Q4H PRN, Gold, Wayne E, PA-C .  atorvastatin (LIPITOR) tablet 20 mg, 20 mg, Oral, QPM, Gold, Wayne E, PA-C .  bisacodyl (DULCOLAX) EC tablet 10 mg, 10 mg, Oral, Daily, [name], MD, 10 mg at 08/09/18 1002 .  cholecalciferol (VITAMIN D3) tablet 2,000 Units, 2,000 Units, Oral, Daily, [name], MD, 2,000 Units at 08/09/18 1001 .  diphenhydrAMINE (BENADRYL) injection 12.5 mg, 12.5 mg, Intravenous, Q6H PRN **OR** diphenhydrAMINE (BENADRYL) 12.5 MG/5ML elixir 12.5 mg, 12.5 mg, Oral, Q6H PRN, Gold, Wayne E, PA-C .  enoxaparin (LOVENOX) injection 40 mg, 40 mg, Subcutaneous, Q24H, Gold, Wayne E, PA-C, 40 mg at 08/09/18 1001 .  fentaNYL 40 mcg/mL PCA injection, , Intravenous, Q4H, Gold, Wayne E, PA-C,  1,000 mcg at 08/08/18 1212 .  multivitamin (PROSIGHT) tablet 1 tablet, 1 tablet, Oral, BID, [name], MD, 1 tablet at 08/09/18 1001 .  naloxone [facility]) injection 0.4 mg, 0.4 mg, Intravenous, PRN **AND** sodium chloride flush (NS) 0.9 % injection 9 mL, 9 mL, Intravenous, PRN, Gold, Wayne E, PA-C .  ondansetron (ZOFRAN) injection 4 mg, 4 mg, Intravenous, Q6H PRN, Gold, Wayne E, PA-C .  oxyCODONE (Oxy IR/ROXICODONE) immediate release tablet 5-10 mg, 5-10 mg, Oral, Q4H PRN, Gold, Wayne E, PA-C .  pantoprazole  (PROTONIX) EC tablet 40 mg, 40 mg, Oral, Daily, Gold, Wayne E, PA-C, 40 mg at 08/09/18 1002 .  potassium chloride 10 mEq in 50 mL *CENTRAL LINE* IVPB, 10 mEq, Intravenous, Daily PRN, Gold, Wayne E, PA-C .  senna-docusate (Senokot-S) tablet 1 tablet, 1 tablet, Oral, QHS, Gold, Wayne E, PA-C, 1 tablet at 08/08/18 2228 .  traMADol (ULTRAM) tablet 50-100 mg, 50-100 mg, Oral, Q6H PRN, Gold, Wayne E, PA-C:  . acetaminophen  1,000 mg Oral Q6H   Or  . acetaminophen (TYLENOL) oral liquid 160 mg/5 mL  1,000 mg Oral Q6H  . atorvastatin  20 mg Oral QPM  . bisacodyl  10 mg Oral Daily  . cholecalciferol  2,000 Units Oral Daily  . enoxaparin (LOVENOX) injection  40 mg Subcutaneous Q24H  . fentaNYL   Intravenous Q4H  . multivitamin  1 tablet Oral BID  . pantoprazole  40 mg Oral Daily  . senna-docusate  1 tablet Oral QHS  :  No Known Allergies:  History reviewed. No pertinent family history.:  Social History   Socioeconomic History  . Marital status: Married    Spouse name: Not on file  . Number of children: Not on file  . Years of education: Not on file  . Highest education level: Not on file  Occupational History  . Not on file  Social Needs  . Financial resource strain: Not on file  . Food insecurity:    Worry: Not on file    Inability: Not on file  . Transportation needs:    Medical: Not on file    Non-medical: Not on file  Tobacco Use  . Smoking status: Never Smoker  . Smokeless tobacco: Never Used  Substance and Sexual Activity  . Alcohol use: Yes    Comment: occasional  . Drug use: No  . Sexual activity: Yes  Lifestyle  . Physical activity:    Days per week: Not on file    Minutes per session: Not on file  . Stress: Not on file  Relationships  . Social connections:    Talks on phone: Not on file    Gets together: Not on file    Attends religious service: Not on file    Active member of club or organization: Not on file    Attends meetings of clubs or organizations:  Not on file    Relationship status: Not on file  . Intimate partner violence:    Fear of current or ex partner: Not on file    Emotionally abused: Not on file    Physically abused: Not on file    Forced sexual activity: Not on file  Other Topics Concern  . Not on file  Social History Narrative  . Not on file  :  Review of Systems  Constitutional: Negative.   HENT: Negative.   Eyes: Negative.   Respiratory: Negative.   Cardiovascular: Negative.   Gastrointestinal: Negative.   Genitourinary: Negative.  Musculoskeletal: Negative.   Skin: Negative.   Neurological: Negative.   Endo/Heme/Allergies: Negative.   Psychiatric/Behavioral: Negative.      Exam: As above Patient Vitals for the past 24 hrs:  BP Temp Temp src Pulse Resp SpO2  08/09/18 1105 127/79 98.7 F (37.1 C) Oral 67 18 97 %  08/09/18 0823 - - - - (!) 22 96 %  08/09/18 0800 122/75 98.5 F (36.9 C) Oral 81 (!) 27 100 %  08/09/18 0412 104/65 97.8 F (36.6 C) Oral (!) 51 14 100 %  08/09/18 0400 - - - - 20 99 %  08/09/18 0010 115/70 98.5 F (36.9 C) Oral 68 15 99 %  08/08/18 2349 - - - - (!) 23 98 %  08/08/18 2002 133/82 98.4 F (36.9 C) Oral 85 19 96 %  08/08/18 2000 - - - - 18 96 %  08/08/18 1620 - - - - (!) 21 96 %  08/08/18 1546 132/83 - - 87 16 98 %  08/08/18 1513 132/83 (!) 97.4 F (36.3 C) Oral 92 15 96 %     No results for input(s): WBC, HGB, HCT, PLT in the last 72 hours. Recent Labs    08/09/18 1022  NA 140  K 3.6  CL 109  CO2 26  GLUCOSE 139*  BUN 15  CREATININE 1.29*  CALCIUM 8.7*    Blood smear review: None  Pathology: Pending    Assessment and Plan: Mr. Jack Gray is a very nice 73 year old white male.  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  He underwent resection followed by adjuvant chemotherapy.  I would have to believe that this is going to be a primary lung cancer.  However, given that the frozen section show that it was a mucinous adenocarcinoma, this could be  metastasis from his sigmoid colon.  I would think that the pattern of metastasis from the sigmoid colon would be to the liver first and not the lung.  We will await the final pathology.  We will send off molecular analysis of the tumor once we get the pathology results back.  Given that he has no obvious measurable metastatic disease, I am not sure exactly what we would accomplish by treating him for metastatic colon cancer.  If this is lung cancer, I would think this would be stage I lung cancer that would not require any adjuvant therapy.  I am just happy that we found this with our surveillance scans.  I would like to think that this is not going to affect his survival or prognosis long-term.  He and his wife would like to go to [place] in early February.  I will see any problems with him doing this.  I believe going to [place] would definitely be to his benefit.  Once we get the pathology back, then we will call him.  Lattie Haw, MD  1 [address] 16:14

## 2018-08-09 NOTE — Discharge Summary (Addendum)
Physician Discharge Summary  Patient ID: Jack Gray MRN: [id number] DOB/AGE: 73-Jun-1947 73 y.o.  Admit date: 08/08/2018 Discharge date: 08/11/2018  Admission Diagnoses: Left lower lobe lung mass  Discharge Diagnoses:  Active Problems:   Lung mass Metastatic colon adenocarcinoma to lung Patient Active Problem List   Diagnosis Date Noted  . Adenocarcinoma (primary lung vs metastatic colon-await final pathology) 08/08/2018  . Counseling regarding goals of care 06/18/2017  . Cancer of sigmoid colon ([place]) 05/30/2017   Consultant: Dr. Marin Olp oncologist  HPI:  The patient is a 73 year old male who is a lifelong non-smoker.  He had a history of a stage IIb sigmoid colon cancer treated with surgery and chemotherapy about a year and a half ago.  In July he was noted on chest CT scan to have a 3 mm left lower lobe lung nodule.  A follow-up CT scan in November showed the nodule had increased in size to 11 mm.  A PET scan revealed minimal uptake with an SUV of 1.6.  He was referred to Modesto Charon, MD for cardiothoracic surgical opinion.  Dr. Roxan Hockey evaluated the patient and his studies and recommended resection.  Was admitted this hospitalization for the procedure.   Discharged Condition: good  Hospital Course: The patient was admitted electively and on 08/08/2018 he was taken to the operating room where she underwent the below described procedure.  She tolerated it well and was taken to the postanesthesia care unit in stable condition.  Postoperative hospital course:  The patient has progressed well.  On postoperative day #1 the patient did not have a air leak and chest tube was placed on waterseal.  Activity has progressed in a routine manner.  Oxygen is weaned and tolerating room air saturations.  Most recent hemoglobin and  hematocrit on 01/04 were stable at 11.4 and 34.6.   Incisions are noted to be healing well without evidence of infection.  He is tolerating diet and has had a  bowel movement. Daily chest x rays were obtained and remained stable. Chest tube was removed on 08/20/2018. Chest x [place] this am showed trace residual left apical pneumothorax and probable small layering bilateral pleural effusions with associated bibasilar atelectasis. As discussed with Dr. Prescott Gum, he is surgically stable for discharge today. A PA/LAT CXR will be taken on 01/10 (to re evaluate small left pneumothorax) and chest tube suture will be removed on this day as well. Final pathology is pending as of day of discharge.  Consults: None  Significant Diagnostic Studies: Routine postoperative serial chest x-rays and labs.  Treatments: surgery:  OPERATIVE REPORT  DATE OF PROCEDURE:  08/08/2018  PREOPERATIVE DIAGNOSIS:  Left lower lobe lung nodule.  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  PROCEDURE:  Left video-assisted thoracoscopy, wedge resection of left lower lobe nodule, lymph node sampling.  SURGEON:  Modesto Charon, MD  ASSISTANT:  Jadene Pierini PA-C  ANESTHESIA:  General.  FINDINGS:  Nodule easily palpable in posterior aspect of left lower lobe.  Frozen section revealed mucinous adenocarcinoma, normal-appearing nodes.  Pathology: Final results pending  Discharge Exam: Blood pressure 123/88, pulse 100, temperature 99.1 F (37.3 C), temperature source Oral, resp. rate (!) 23, height 5\' 8"  (1.727 m), weight 76.7 kg, SpO2 98 %.   Cardiovascular: RRR Pulmonary: Clear to auscultation bilaterally Abdomen: Soft, non tender, bowel sounds present. Extremities: No lower extremity edema. Wounds: Clean and dry.  No erythema or signs of infection.   Disposition: Discharge disposition: 01-Home  or Self Care     Stable   Allergies as of 08/11/2018   No Known Allergies     Medication List    TAKE these medications   atorvastatin 20 MG tablet Commonly known as:  LIPITOR Take 20 mg by mouth every  evening.   GLUCOSAMINE PO Take 1 tablet by mouth 2 (two) times daily.   omeprazole 20 MG capsule Commonly known as:  PRILOSEC Take 20 mg by mouth daily.   oxyCODONE 5 MG immediate release tablet Commonly known as:  Oxy IR/ROXICODONE Take 1 tablet (5 mg total) by mouth every 4 (four) hours as needed for up to 7 days for severe pain.   PRESERVISION AREDS PO Take 1 capsule by mouth 2 (two) times daily.   MULTIVITAMIN PO Take 1 tablet by mouth daily.   Vitamin D 50 MCG (2000 UT) Caps Take 2,000 Units by mouth daily.      Follow-up Information    [name], MD. Go on 08/27/2018.   Specialty:  Cardiothoracic Surgery Why:  PA/LAT CXR to be taken (at [facility] which is in the same building as Dr. Leonarda Salon office on 08/27/2018 at 1:45 pm;Appointment time is at 2:15 pm Contact information: [address] [place] [address] [phone]        Nurse. Go on 08/16/2018.   Why:  PA/LAT CXR to be taken (at [facility] which is in the same building as Dr. Leonarda Salon office at 10:30 am (small left pneumothorax seen on day of discharge) and see nurse for chest tube suture removal. Appointment time is at 11:00 am Contact information: [place] [id number]          Signed: Nani Skillern PA-C 08/11/2018, 9:50 AM

## 2018-08-10 ENCOUNTER — Inpatient Hospital Stay (HOSPITAL_COMMUNITY): Payer: Medicare Other

## 2018-08-10 LAB — COMPREHENSIVE METABOLIC PANEL
ALBUMIN: 3.4 g/dL — AB (ref 3.5–5.0)
ALT: 15 U/L (ref 0–44)
AST: 34 U/L (ref 15–41)
Alkaline Phosphatase: 51 U/L (ref 38–126)
Anion gap: 4 — ABNORMAL LOW (ref 5–15)
BUN: 13 mg/dL (ref 8–23)
CO2: 26 mmol/L (ref 22–32)
Calcium: 8.8 mg/dL — ABNORMAL LOW (ref 8.9–10.3)
Chloride: 109 mmol/L (ref 98–111)
Creatinine, Ser: 1.15 mg/dL (ref 0.61–1.24)
GFR calc Af Amer: >60 mL/min (ref >60)
GFR calc non Af Amer: >60 mL/min (ref >60)
Glucose, Bld: 98 mg/dL (ref 70–99)
Potassium: 3.9 mmol/L (ref 3.5–5.1)
Sodium: 139 mmol/L (ref 135–145)
Total Bilirubin: 1.1 mg/dL (ref 0.3–1.2)
Total Protein: 5.7 g/dL — ABNORMAL LOW (ref 6.5–8.1)

## 2018-08-10 LAB — CBC
HCT: 34.6 % — ABNORMAL LOW (ref 39.0–52.0)
Hemoglobin: 11.5 g/dL — ABNORMAL LOW (ref 13.0–17.0)
MCH: 30.4 pg (ref 26.0–34.0)
MCHC: 33.2 g/dL (ref 30.0–36.0)
MCV: 91.5 fL (ref 80.0–100.0)
NRBC: 0 % (ref 0.0–0.2)
Platelets: 109 10*3/uL — ABNORMAL LOW (ref 150–400)
RBC: 3.78 MIL/uL — AB (ref 4.22–5.81)
RDW: 12.6 % (ref 11.5–15.5)
WBC: 7.8 10*3/uL (ref 4.0–10.5)

## 2018-08-10 MED ORDER — OXYCODONE HCL 5 MG PO TABS: 5.0000 mg | ORAL_TABLET | ORAL | 0 refills | Status: AC | PRN

## 2018-08-10 MED ORDER — POTASSIUM CHLORIDE CRYS ER 20 MEQ PO TBCR
20.0000 meq | EXTENDED_RELEASE_TABLET | Freq: Once | ORAL | Status: AC
  Administered 2018-08-10: 20 meq via ORAL
  Filled 2018-08-10: qty 1

## 2018-08-10 NOTE — Plan of Care (Signed)
  Problem: Education: Goal: Knowledge of General Education information will improve Description Including pain rating scale, medication(s)/side effects and non-pharmacologic comfort measures Outcome: Progressing   Problem: Health Behavior/Discharge Planning: Goal: Ability to manage health-related needs will improve Outcome: Progressing   Problem: Clinical Measurements: Goal: Ability to maintain clinical measurements within normal limits will improve Outcome: Progressing   Problem: Clinical Measurements: Goal: Respiratory complications will improve Outcome: Adequate for Discharge   Problem: Activity: Goal: Risk for activity intolerance will decrease Outcome: Adequate for Discharge   Problem: Coping: Goal: Level of anxiety will decrease Outcome: Adequate for Discharge   Problem: Pain Managment: Goal: General experience of comfort will improve Outcome: Adequate for Discharge   Problem: Safety: Goal: Ability to remain free from injury will improve Outcome: Adequate for Discharge   Problem: Skin Integrity: Goal: Risk for impaired skin integrity will decrease Outcome: Adequate for Discharge

## 2018-08-10 NOTE — Progress Notes (Signed)
Chest tube removed. Pt on bedrest for 30 min. VS stable, 98% Room Air. Denied any pain. Call bell within reach. Will continue to monitor pt.

## 2018-08-10 NOTE — Progress Notes (Addendum)
      [address]       [place] [address]             [phone]       2 Days Post-Op Procedure(s) (LRB): VIDEO ASSISTED THORACOSCOPY (VATS)/WEDGE RESECTION LEFT LOWER LOBE, LYMPH NODE DISSECTION (Left)  Subjective: Patient without specific complaints this am.  Objective: Vital signs in last 24 hours: Temp:  [98.3 F (36.8 C)-98.7 F (37.1 C)] 98.6 F (37 C) (01/04 0345) Pulse Rate:  [66-83] 66 (01/04 0345) Cardiac Rhythm: Normal sinus rhythm (01/04 0700) Resp:  [13-20] 20 (01/04 0345) BP: (107-132)/(65-83) 132/83 (01/04 0345) SpO2:  [97 %-100 %] 100 % (01/04 0345)      Intake/Output from previous day: 01/03 0701 - 01/04 0700 In: 910.8 [P.O.:480; I.V.:430.8] Out: 1320 [Urine:1030; Chest Tube:290]   Physical Exam:  Cardiovascular: RRR Pulmonary: Clear to auscultation bilaterally Abdomen: Soft, non tender, bowel sounds present. Extremities: No lower extremity edema. Wounds: Clean and dry.  No erythema or signs of infection. Chest tub dressing with old blood. I changed dressing this am. Chest Tube: to water seal, no air leak (only tidling with cough)  Lab Results: CBC: Recent Labs    08/09/18 1022 08/10/18 0315  WBC 9.7 7.8  HGB 11.8* 11.5*  HCT 34.0* 34.6*  PLT 115* 109*   BMET:  Recent Labs    08/09/18 1022 08/10/18 0315  NA 140 139  K 3.6 3.9  CL 109 109  CO2 26 26  GLUCOSE 139* 98  BUN 15 13  CREATININE 1.29* 1.15  CALCIUM 8.7* 8.8*    PT/INR: No results for input(s): LABPROT, INR in the last 72 hours. ABG:  INR: Will add last result for INR, ABG once components are confirmed Will add last 4 CBG results once components are confirmed  Assessment/Plan:  1. CV - SR 2.  Pulmonary - On room air. Chest tube with 290 cc last 24 hours (? Likely not true output as there is minor output in later columns and the second column is not even half full). Chest tube is to water seal and there is no air leak (only tidling with cough).  CXR this am shows no pneumothorax, bibasilar atelectasis. Hope to remove chest tube . Encourage incentive spirometer. 3. Appreciate Dr. Antonieta Pert evaluation-await final pathology 4. Remove PCA and central line after chest tube removed  Donielle M ZimmermanPA-C 08/10/2018,8:25 AM [phone]  Minimal chest tube drainage in the past 24 hours, no air leak Will DC chest tube today and check x-[place] in a.m. Plan discharge on Monday after seen by Dr. Roxan Hockey  patient examined and medical record reviewed,agree with above note. Tharon Aquas Trigt III 08/10/2018

## 2018-08-11 ENCOUNTER — Inpatient Hospital Stay (HOSPITAL_COMMUNITY): Payer: Medicare Other

## 2018-08-11 NOTE — Progress Notes (Signed)
Central site  Has no bleeding noted 30 mins  after removal.Pt left unit for home in wheelchair, accompanied by daughter and nurse tech.

## 2018-08-11 NOTE — Progress Notes (Addendum)
      [address]       [place] [address]             [phone]       3 Days Post-Op Procedure(s) (LRB): VIDEO ASSISTED THORACOSCOPY (VATS)/WEDGE RESECTION LEFT LOWER LOBE, LYMPH NODE DISSECTION (Left)  Subjective: Patient hoping to go home.  Objective: Vital signs in last 24 hours: Temp:  [98.2 F (36.8 C)-99.1 F (37.3 C)] 99.1 F (37.3 C) (01/05 0804) Pulse Rate:  [77-100] 100 (01/05 0804) Cardiac Rhythm: Sinus tachycardia (01/05 0700) Resp:  [16-30] 23 (01/05 0804) BP: (104-153)/(70-93) 123/88 (01/05 0804) SpO2:  [98 %-100 %] 98 % (01/05 0804)     Intake/Output from previous day: 01/04 0701 - 01/05 0700 In: 480 [P.O.:480] Out: 2500 [Urine:2500]   Physical Exam:  Cardiovascular: RRR Pulmonary: Clear to auscultation bilaterally Abdomen: Soft, non tender, bowel sounds present. Extremities: No lower extremity edema. Wounds: Clean and dry.  No erythema or signs of infection.    Lab Results: CBC: Recent Labs    08/09/18 1022 08/10/18 0315  WBC 9.7 7.8  HGB 11.8* 11.5*  HCT 34.0* 34.6*  PLT 115* 109*   BMET:  Recent Labs    08/09/18 1022 08/10/18 0315  NA 140 139  K 3.6 3.9  CL 109 109  CO2 26 26  GLUCOSE 139* 98  BUN 15 13  CREATININE 1.29* 1.15  CALCIUM 8.7* 8.8*    PT/INR: No results for input(s): LABPROT, INR in the last 72 hours. ABG:  INR: Will add last result for INR, ABG once components are confirmed Will add last 4 CBG results once components are confirmed  Assessment/Plan:  1. CV - SR 2.  Pulmonary - On room air. CXR this am shows small left apical pneumothorax, bibasilar atelectasis and small pleural effusions.  Encourage incentive spirometer. 3. Anemia-H and H yesterday stable at 11.5 and 34.6 4. Appreciate Dr. Antonieta Pert evaluation-await final pathology 5. As discussed with Dr. Prescott Gum, will discharge patient. Patient has appointment for chest tube suture removal on 01/10 so will get PA/LAT CXR to make  sure left apical ptx is stable.  Donielle M ZimmermanPA-C 08/11/2018,8:21 AM [phone]   Patient examined and am CXR reviewed Patient stable for DC DC instructions reviewed with patient patient examined and medical record reviewed,agree with above note. Tharon Aquas Trigt III 08/11/2018

## 2018-08-11 NOTE — Progress Notes (Signed)
Fentanyl 20 mls discarded via stericycle , witnessed by [employer].

## 2018-08-12 LAB — TYPE AND SCREEN
ABO/RH(D): O POS
Antibody Screen: NEGATIVE
UNIT DIVISION: 0
Unit division: 0

## 2018-08-12 LAB — BPAM RBC
Blood Product Expiration Date: 202001292359
Blood Product Expiration Date: 202001292359
ISSUE DATE / TIME: 202001020719
ISSUE DATE / TIME: 202001020719
Unit Type and Rh: 5100
Unit Type and Rh: 5100

## 2018-08-12 NOTE — Consult Note (Signed)
            [facility] CM Primary Care Navigator  08/12/2018  Jack Gray Apr 13, 1946 [id number]    Attempt to see patient at the bedside to identify possible discharge needs buthe was already discharged home over the weekend.  Per MD note, patient had increasing size of left lower lobe lung nodule and was admitted this hospitalization for resection. 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   Primary care provider's office is listed as providing transition of care (TOC) follow-up.   For additional questions please contact:  Edwena Felty A. Dessire Grimes, BSN, RN-BC [facility] PRIMARY CARE Navigator Cell: [phone]

## 2018-08-14 ENCOUNTER — Telehealth: Payer: Self-pay

## 2018-08-14 NOTE — Telephone Encounter (Signed)
Received VM from pt requesting call with pathology results. Dr Marin Olp aware & will contact pt. dph

## 2018-08-15 ENCOUNTER — Encounter: Payer: Self-pay | Admitting: *Deleted

## 2018-08-15 ENCOUNTER — Ambulatory Visit: Payer: Medicare Other | Admitting: Hematology & Oncology

## 2018-08-15 ENCOUNTER — Other Ambulatory Visit: Payer: Self-pay | Admitting: Thoracic Surgery (Cardiothoracic Vascular Surgery)

## 2018-08-15 ENCOUNTER — Other Ambulatory Visit: Payer: Medicare Other

## 2018-08-15 DIAGNOSIS — R918 Other nonspecific abnormal finding of lung field: Secondary | ICD-10-CM

## 2018-08-15 NOTE — Progress Notes (Signed)
[employer] paperwork faxed to [phone]. Faxed confirmation confirmed.

## 2018-08-16 ENCOUNTER — Ambulatory Visit (INDEPENDENT_AMBULATORY_CARE_PROVIDER_SITE_OTHER): Payer: Self-pay

## 2018-08-16 ENCOUNTER — Ambulatory Visit
Admission: RE | Admit: 2018-08-16 | Discharge: 2018-08-16 | Disposition: A | Discharge status: Home / Self Care | Payer: Medicare Other | Source: Ambulatory Visit | Attending: Thoracic Surgery (Cardiothoracic Vascular Surgery) | Admitting: Thoracic Surgery (Cardiothoracic Vascular Surgery)

## 2018-08-16 DIAGNOSIS — R918 Other nonspecific abnormal finding of lung field: Secondary | ICD-10-CM

## 2018-08-16 DIAGNOSIS — Z5332 Thoracoscopic surgical procedure converted to open procedure: Secondary | ICD-10-CM | POA: Diagnosis not present

## 2018-08-16 DIAGNOSIS — Z4802 Encounter for removal of sutures: Secondary | ICD-10-CM

## 2018-08-16 NOTE — Progress Notes (Signed)
Removed 1 suture from chest tube site, no signs of infection and patient tolerates well. He did c/o some numbness in bottom lip since having his surgery.I told him I would inform Dr Roxan Hockey. He is scheduled to see him 08/27/18

## 2018-08-22 DIAGNOSIS — C187 Malignant neoplasm of sigmoid colon: Secondary | ICD-10-CM | POA: Diagnosis not present

## 2018-08-26 ENCOUNTER — Other Ambulatory Visit: Payer: Self-pay | Admitting: Thoracic Surgery (Cardiothoracic Vascular Surgery)

## 2018-08-26 DIAGNOSIS — R918 Other nonspecific abnormal finding of lung field: Secondary | ICD-10-CM

## 2018-08-27 ENCOUNTER — Ambulatory Visit
Admission: RE | Admit: 2018-08-27 | Discharge: 2018-08-27 | Disposition: A | Discharge status: Home / Self Care | Payer: Medicare Other | Source: Ambulatory Visit | Attending: Thoracic Surgery (Cardiothoracic Vascular Surgery) | Admitting: Thoracic Surgery (Cardiothoracic Vascular Surgery)

## 2018-08-27 ENCOUNTER — Encounter (HOSPITAL_COMMUNITY): Payer: Self-pay | Admitting: Hematology & Oncology

## 2018-08-27 ENCOUNTER — Encounter: Payer: Self-pay | Admitting: Thoracic Surgery (Cardiothoracic Vascular Surgery)

## 2018-08-27 ENCOUNTER — Other Ambulatory Visit: Payer: Self-pay

## 2018-08-27 ENCOUNTER — Ambulatory Visit (INDEPENDENT_AMBULATORY_CARE_PROVIDER_SITE_OTHER): Payer: Self-pay | Admitting: Thoracic Surgery (Cardiothoracic Vascular Surgery)

## 2018-08-27 VITALS — BP 141/89 | HR 88 | Resp 18 | Ht 68.0 in | Wt 162.0 lb

## 2018-08-27 DIAGNOSIS — R918 Other nonspecific abnormal finding of lung field: Secondary | ICD-10-CM | POA: Diagnosis not present

## 2018-08-27 NOTE — Progress Notes (Signed)
[address]       [place] [address]             [phone]     HPI: Jack Gray returns for scheduled follow-up visit  Jack Gray is a 73 year old man with a history of a stage IIIb (T3, N1, cM0) adenocarcinoma of the sigmoid colon.  This was diagnosed in November 2018.  He had surgery and then completed adjuvant chemotherapy.  In July 2019 he was noted to have a new 3 mm lung nodule.  On follow-up in November it increased in size to 11 mm.  There was minimal uptake on the PET/CT.  I did a wedge resection and node sampling on 08/08/2018.  It turned out to be metastatic colon adenocarcinoma.  Postoperatively he did well and went home on day 3.  He did have some pain and swelling in his lower lip.  This was likely due to intubation.  He still has some numbness in a small area of the lower lip.  He has not taken any narcotics since discharge.  He is exercising on a regular basis.  Past Medical History:  Diagnosis Date  . Anemia    as a child  . Cancer of sigmoid colon ([place]) 05/30/2017  . Counseling regarding goals of care 06/18/2017  . History of kidney stones   . Pneumonia    as a child    Current Outpatient Medications  Medication Sig Dispense Refill  . atorvastatin (LIPITOR) 20 MG tablet Take 20 mg by mouth every evening.     . Cholecalciferol (VITAMIN D) 2000 units CAPS Take 2,000 Units by mouth daily.    . Glucosamine HCl (GLUCOSAMINE PO) Take 1 tablet by mouth 2 (two) times daily.    . Multiple Vitamins-Minerals (MULTIVITAMIN PO) Take 1 tablet by mouth daily.    . Multiple Vitamins-Minerals (PRESERVISION AREDS PO) Take 1 capsule by mouth 2 (two) times daily.     Marland Kitchen omeprazole (PRILOSEC) 20 MG capsule Take 20 mg by mouth daily.     No current facility-administered medications for this visit.     Physical Exam BP (!) 141/89 (BP Location: Right Arm, Patient Position: Sitting, Cuff Size: Normal)   Pulse 88   Resp 18   Ht 5\' 8"  (1.727 m)   Wt 162 lb (73.5  kg)   SpO2 98% Comment: RA  BMI 24.46 kg/m  73 year old man in no acute distress Alert and oriented x3 with no focal deficits Lungs clear equal breath sounds bilaterally Incisions well-healed  Diagnostic Tests: CHEST - 2 VIEW  COMPARISON:  August 16, 2018  FINDINGS: The heart size and mediastinal contours are within normal limits. Linear opacity of right lung base is identified. The left lung is clear. The visualized skeletal structures are stable.  IMPRESSION: Linear opacity of right lung base favor atelectasis. No acute abnormality identified.   Electronically Signed   By: Abelardo Diesel M.D.   On: 08/27/2018 14:13 I personally reviewed the chest x-[place] images and concur with the findings noted above  Impression: Jack Gray is a 73 year old gentleman who is about a year out from treatment for a stage IIIb colon cancer.  He was first found to have a lung nodule about 6 months ago.  It was only 3 mm at that time.  A follow-up scan 4 months later showed an increased in size to 11 mm.  I did a wedge resection of that about 3 weeks ago.  It turned  out to be metastatic colon adenocarcinoma.  Multiple lymph nodes were sampled and all were negative for tumor.  He had no other suspicious pulmonary lesions.  From a surgical standpoint he is doing quite well.  He has been driving.  He is exercising on a regular basis.  He is not having to take any pain medication.  At this point his activities are unrestricted.  Plan: Follow-up as scheduled with Dr. Marin Olp I will be happy to see him back again anytime in the future if I can be of any further assistance with his care  [name], MD [facility] [phone]

## 2018-09-06 ENCOUNTER — Inpatient Hospital Stay (HOSPITAL_BASED_OUTPATIENT_CLINIC_OR_DEPARTMENT_OTHER): Payer: Medicare Other | Admitting: Hematology & Oncology

## 2018-09-06 ENCOUNTER — Encounter: Payer: Self-pay | Admitting: Hematology & Oncology

## 2018-09-06 ENCOUNTER — Inpatient Hospital Stay: Payer: Medicare Other | Attending: Hematology & Oncology

## 2018-09-06 VITALS — BP 129/81 | HR 68 | Temp 98.0°F | Resp 18 | Ht 68.0 in | Wt 168.1 lb

## 2018-09-06 DIAGNOSIS — Z79899 Other long term (current) drug therapy: Secondary | ICD-10-CM

## 2018-09-06 DIAGNOSIS — C187 Malignant neoplasm of sigmoid colon: Secondary | ICD-10-CM | POA: Diagnosis not present

## 2018-09-06 DIAGNOSIS — C7800 Secondary malignant neoplasm of unspecified lung: Secondary | ICD-10-CM | POA: Diagnosis not present

## 2018-09-06 LAB — CBC WITH DIFFERENTIAL (CANCER CENTER ONLY)
Abs Immature Granulocytes: 0.02 10*3/uL (ref 0.00–0.07)
Basophils Absolute: 0 10*3/uL (ref 0.0–0.1)
Basophils Relative: 0 %
Eosinophils Absolute: 0.3 10*3/uL (ref 0.0–0.5)
Eosinophils Relative: 4 %
HCT: 38.1 % — ABNORMAL LOW (ref 39.0–52.0)
Hemoglobin: 12.8 g/dL — ABNORMAL LOW (ref 13.0–17.0)
Immature Granulocytes: 0 %
Lymphocytes Relative: 23 %
Lymphs Abs: 1.6 10*3/uL (ref 0.7–4.0)
MCH: 30.6 pg (ref 26.0–34.0)
MCHC: 33.6 g/dL (ref 30.0–36.0)
MCV: 91.1 fL (ref 80.0–100.0)
Monocytes Absolute: 0.8 10*3/uL (ref 0.1–1.0)
Monocytes Relative: 11 %
Neutro Abs: 4.2 10*3/uL (ref 1.7–7.7)
Neutrophils Relative %: 62 %
Platelet Count: 131 10*3/uL — ABNORMAL LOW (ref 150–400)
RBC: 4.18 MIL/uL — ABNORMAL LOW (ref 4.22–5.81)
RDW: 12.4 % (ref 11.5–15.5)
WBC: 6.9 10*3/uL (ref 4.0–10.5)
nRBC: 0 % (ref 0.0–0.2)

## 2018-09-06 LAB — CMP (CANCER CENTER ONLY)
ALBUMIN: 4.6 g/dL (ref 3.5–5.0)
ALT: 19 U/L (ref 0–44)
AST: 24 U/L (ref 15–41)
Alkaline Phosphatase: 81 U/L (ref 38–126)
Anion gap: 5 (ref 5–15)
BUN: 19 mg/dL (ref 8–23)
CHLORIDE: 106 mmol/L (ref 98–111)
CO2: 32 mmol/L (ref 22–32)
CREATININE: 1.16 mg/dL (ref 0.61–1.24)
Calcium: 10.2 mg/dL (ref 8.9–10.3)
GFR, Est AFR Am: >60 mL/min (ref >60)
GFR, Estimated: >60 mL/min (ref >60)
Glucose, Bld: 115 mg/dL — ABNORMAL HIGH (ref 70–99)
Potassium: 4.9 mmol/L (ref 3.5–5.1)
Sodium: 143 mmol/L (ref 135–145)
Total Bilirubin: 0.7 mg/dL (ref 0.3–1.2)
Total Protein: 6.7 g/dL (ref 6.5–8.1)

## 2018-09-06 LAB — LACTATE DEHYDROGENASE: LDH: 157 U/L (ref 98–192)

## 2018-09-06 LAB — CEA (IN HOUSE-CHCC): CEA (CHCC-In House): 1.7 ng/mL (ref 0.00–5.00)

## 2018-09-06 NOTE — Progress Notes (Signed)
Hematology and Oncology Follow Up Visit  KIREE DEJARNETTE [id number] [birth year] 73 y.o. 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 Schwandt is back for follow-up.  He is looking quite good.  He did undergo curative resection of the solitary lung metastasis on 08/08/2018.  The pathology report ([id number]) showed mucinous adenocarcinoma.  It was 1.1 cm.  It was consistent with a colorectal adenocarcinoma.  Margins were negative.  5 lymph nodes were removed and all were negative.  We did do molecular profiling.  The tumor is BRAF positive.  I do not feel that there is an indication for therapy on him.  He has no measurable disease.  I do not believe that we should put him through therapy if we cannot see if it is working.  He has had a normal CEA so we cannot use that as a marker for disease response.  I am sure that his cancer will come back in the future.  However, until that time occurs, we can just watch him.  Given that he has a BRAF mutation, he would be a candidate for targeted therapy.  He and his wife are on their way to [place] today.  They will be gone for a few weeks.  I told him to make sure that they stop every couple hours to walk around.  I told him to make sure that they drink a lot of water.  Also, they need to wear sunscreen.  He has had no issues with nausea or vomiting.  He has had no cough or shortness of breath.  He has had no rashes.  He has had no change in bowel or bladder habits.  Overall, his performance status is ECOG 0. .   Medications:  Current Outpatient Medications:  .  atorvastatin (LIPITOR) 20 MG tablet, Take 20 mg by mouth every evening. , Disp: , Rfl:  .  Cholecalciferol  (VITAMIN D) 2000 units CAPS, Take 2,000 Units by mouth daily., Disp: , Rfl:  .  Glucosamine HCl (GLUCOSAMINE PO), Take 1 tablet by mouth 2 (two) times daily., Disp: , Rfl:  .  Multiple Vitamins-Minerals (MULTIVITAMIN PO), Take 1 tablet by mouth daily., Disp: , Rfl:  .  Multiple Vitamins-Minerals (PRESERVISION AREDS PO), Take 1 capsule by mouth 2 (two) times daily. , Disp: , Rfl:  .  omeprazole (PRILOSEC) 20 MG capsule, Take 20 mg by mouth daily., Disp: , Rfl:   Allergies: No Known Allergies  Past Medical History, Surgical history, Social history, and Family History were reviewed and updated.  Review of Systems: Review of Systems  Constitutional: Negative for appetite change, fatigue, fever and unexpected weight change.  HENT:   Negative for lump/mass, mouth sores, sore throat and trouble swallowing.   Respiratory: Negative for cough, hemoptysis and shortness of breath.   Cardiovascular: Negative for leg swelling and palpitations.  Gastrointestinal: Negative for abdominal distention, abdominal pain, blood in stool, constipation, diarrhea, nausea and vomiting.  Genitourinary: Negative for bladder incontinence, dysuria, frequency and hematuria.   Musculoskeletal: Negative for arthralgias, back pain, gait problem and myalgias.  Skin: Negative for itching and rash.  Neurological: Negative for dizziness, extremity weakness, gait problem, headaches, numbness, seizures  and speech difficulty.  Hematological: Does not bruise/bleed easily.  Psychiatric/Behavioral: Negative for depression and sleep disturbance. The patient is not nervous/anxious.     Physical Exam:  height is 5' 8" (1.727 m) and weight is 168 lb 1.9 oz (76.3 kg). His oral temperature is 98 F (36.7 C). His blood pressure is 129/81 and his pulse is 68. His respiration is 18 and oxygen saturation is 99%.   Wt Readings from Last 3 Encounters:  09/06/18 168 lb 1.9 oz (76.3 kg)  08/27/18 162 lb (73.5 kg)  08/08/18 169 lb 3.2 oz (76.7  kg)    Physical Exam Vitals signs reviewed.  HENT:     Head: Normocephalic and atraumatic.  Eyes:     Pupils: Pupils are equal, round, and reactive to light.  Neck:     Musculoskeletal: Normal range of motion.  Cardiovascular:     Rate and Rhythm: Normal rate and regular rhythm.     Heart sounds: Normal heart sounds.  Pulmonary:     Effort: Pulmonary effort is normal.     Breath sounds: Normal breath sounds.  Abdominal:     General: Bowel sounds are normal.     Palpations: Abdomen is soft.  Musculoskeletal: Normal range of motion.        General: No tenderness or deformity.  Lymphadenopathy:     Cervical: No cervical adenopathy.  Skin:    General: Skin is warm and dry.     Findings: No erythema or rash.  Neurological:     Mental Status: He is alert and oriented to person, place, and time.  Psychiatric:        Behavior: Behavior normal.        Thought Content: Thought content normal.        Judgment: Judgment normal.      Lab Results  Component Value Date   WBC 6.9 09/06/2018   HGB 12.8 (L) 09/06/2018   HCT 38.1 (L) 09/06/2018   MCV 91.1 09/06/2018   PLT 131 (L) 09/06/2018     Chemistry      Component Value Date/Time   NA 143 09/06/2018 0809   NA 141 08/08/2017 0802   K 4.9 09/06/2018 0809   K 3.7 08/08/2017 0802   CL 106 09/06/2018 0809   CL 105 08/08/2017 0802   CO2 32 09/06/2018 0809   CO2 29 08/08/2017 0802   BUN 19 09/06/2018 0809   BUN 15 08/08/2017 0802   CREATININE 1.16 09/06/2018 0809   CREATININE 1.1 08/08/2017 0802      Component Value Date/Time   CALCIUM 10.2 09/06/2018 0809   CALCIUM 9.2 08/08/2017 0802   ALKPHOS 81 09/06/2018 0809   ALKPHOS 86 (H) 08/08/2017 0802   AST 24 09/06/2018 0809   ALT 19 09/06/2018 0809   ALT 64 (H) 08/08/2017 0802   BILITOT 0.7 09/06/2018 0809       Impression and Plan: Mr. Granzow is a 73 year old white male.  He had a stage IIIb adenocarcinoma of the sigmoid colon.  He had this resected.  Even though he  had negative lymph nodes, he had 2 nodules which may have been lymph nodes.  He received adjuvant chemotherapy with FOLFOX.  He then had this recurrence which was fairly early after completing the FOLFOX chemotherapy.  Thankfully, he had a solitary recurrence.  He had this resected.  We now will just follow him.  I believe that surveillance is appropriate.  I will plan for a CT scan to be done  in March.  Again, because he has the BRAF mutation, we should be able to see a very good response with targeted therapy if we have to treat him in the future.  I spent about 45 minutes with he and his wife.  All the time spent face-to-face.  I reviewed his pathology report with them.  I reviewed his molecular profile report.  I answered all their questions.  I helped arrange his follow-up appointments.  I will arrange his CT scans.    Lattie Haw, MD

## 2018-09-16 ENCOUNTER — Telehealth: Payer: Self-pay | Admitting: *Deleted

## 2018-09-16 NOTE — Telephone Encounter (Signed)
Message left from patient stating that he would like to speak with Dr. Marin Olp.  Call placed back to patient and patient states that he would like to speak with Dr. Marin Olp regarding BRAF mutation.  Dr. Marin Olp notified.  Patient appreciative of call back.

## 2018-10-29 ENCOUNTER — Inpatient Hospital Stay: Payer: Medicare Other

## 2018-10-29 ENCOUNTER — Ambulatory Visit (HOSPITAL_BASED_OUTPATIENT_CLINIC_OR_DEPARTMENT_OTHER)
Admission: RE | Admit: 2018-10-29 | Discharge: 2018-10-29 | Disposition: A | Discharge status: Home / Self Care | Payer: Medicare Other | Source: Ambulatory Visit | Attending: Hematology & Oncology | Admitting: Hematology & Oncology

## 2018-10-29 ENCOUNTER — Inpatient Hospital Stay: Payer: Medicare Other | Attending: Hematology & Oncology | Admitting: Hematology & Oncology

## 2018-10-29 ENCOUNTER — Other Ambulatory Visit: Payer: Self-pay

## 2018-10-29 VITALS — BP 132/83 | HR 84 | Temp 98.3°F | Resp 17 | Wt 169.5 lb

## 2018-10-29 DIAGNOSIS — C187 Malignant neoplasm of sigmoid colon: Secondary | ICD-10-CM

## 2018-10-29 DIAGNOSIS — Z85118 Personal history of other malignant neoplasm of bronchus and lung: Secondary | ICD-10-CM | POA: Diagnosis not present

## 2018-10-29 DIAGNOSIS — R911 Solitary pulmonary nodule: Secondary | ICD-10-CM | POA: Diagnosis not present

## 2018-10-29 DIAGNOSIS — C7801 Secondary malignant neoplasm of right lung: Secondary | ICD-10-CM | POA: Diagnosis not present

## 2018-10-29 DIAGNOSIS — Z85038 Personal history of other malignant neoplasm of large intestine: Secondary | ICD-10-CM | POA: Diagnosis not present

## 2018-10-29 DIAGNOSIS — Z79899 Other long term (current) drug therapy: Secondary | ICD-10-CM | POA: Diagnosis not present

## 2018-10-29 LAB — CMP (CANCER CENTER ONLY)
ALK PHOS: 69 U/L (ref 38–126)
ALT: 21 U/L (ref 0–44)
ANION GAP: 8 (ref 5–15)
AST: 26 U/L (ref 15–41)
Albumin: 4.8 g/dL (ref 3.5–5.0)
BILIRUBIN TOTAL: 0.6 mg/dL (ref 0.3–1.2)
BUN: 21 mg/dL (ref 8–23)
CALCIUM: 9.6 mg/dL (ref 8.9–10.3)
CO2: 31 mmol/L (ref 22–32)
Chloride: 102 mmol/L (ref 98–111)
Creatinine: 1.27 mg/dL — ABNORMAL HIGH (ref 0.61–1.24)
GFR, Est AFR Am: >60 mL/min (ref >60)
GFR, Estimated: 56 mL/min — ABNORMAL LOW (ref >60)
Glucose, Bld: 118 mg/dL — ABNORMAL HIGH (ref 70–99)
Potassium: 4.3 mmol/L (ref 3.5–5.1)
Sodium: 141 mmol/L (ref 135–145)
TOTAL PROTEIN: 6.7 g/dL (ref 6.5–8.1)

## 2018-10-29 LAB — CEA (IN HOUSE-CHCC): CEA (CHCC-In House): 1.91 ng/mL (ref 0.00–5.00)

## 2018-10-29 LAB — CBC WITH DIFFERENTIAL (CANCER CENTER ONLY)
Abs Immature Granulocytes: 0.01 10*3/uL (ref 0.00–0.07)
Basophils Absolute: 0 10*3/uL (ref 0.0–0.1)
Basophils Relative: 0 %
EOS PCT: 2 %
Eosinophils Absolute: 0.1 10*3/uL (ref 0.0–0.5)
HCT: 40.8 % (ref 39.0–52.0)
Hemoglobin: 13.8 g/dL (ref 13.0–17.0)
Immature Granulocytes: 0 %
Lymphocytes Relative: 31 %
Lymphs Abs: 2.1 10*3/uL (ref 0.7–4.0)
MCH: 30.6 pg (ref 26.0–34.0)
MCHC: 33.8 g/dL (ref 30.0–36.0)
MCV: 90.5 fL (ref 80.0–100.0)
Monocytes Absolute: 0.6 10*3/uL (ref 0.1–1.0)
Monocytes Relative: 9 %
NRBC: 0 % (ref 0.0–0.2)
Neutro Abs: 4 10*3/uL (ref 1.7–7.7)
Neutrophils Relative %: 58 %
Platelet Count: 141 10*3/uL — ABNORMAL LOW (ref 150–400)
RBC: 4.51 MIL/uL (ref 4.22–5.81)
RDW: 12 % (ref 11.5–15.5)
WBC Count: 6.8 10*3/uL (ref 4.0–10.5)

## 2018-10-29 MED ORDER — IOHEXOL 300 MG/ML  SOLN
100.0000 mL | Freq: Once | INTRAMUSCULAR | Status: AC | PRN
  Administered 2018-10-29: 100 mL via INTRAVENOUS

## 2018-10-29 NOTE — Progress Notes (Signed)
Hematology and Oncology Follow Up Visit  Jack Gray [id number] [birth year] 73 y.o. 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  Unfortunately, we have a problem.  He had his CAT scans done today.  There is a new 1 cm nodule in the left right upper lung.  This clearly is metastatic.  There is no disease anywhere else.  There is no adenopathy.  He has no liver lesions.  It is very troublesome that this recurrence is about 3 months after his initial lung metastasis that was resected.  I think this is an indicator that his disease will keep coming back.  I just hate the fact that I would have to treat him systemically for 1 spot.  I thought that maybe because it was just one spot that we could consider stereotactic radiosurgery for this.  He feels well.  He and his wife had a wonderful time down in [place] when we last saw them.  They go down there in the wintertime.  The other great news is that his daughter is getting married.  I took care of her husband a few years ago who had an incredibly aggressive colon cancer and she now has found somebody else.  I am so happy for her.  She deserves this.  His appetite is good.  He has had no cough.  He says sometimes when he takes a deep breath then he has little bit of discomfort over on the left side after his surgery.   He has had no change in bowel or bladder habits.  Overall, his performance status is ECOG 0. .   Medications:  Current Outpatient Medications:  .  atorvastatin (LIPITOR) 20 MG tablet, Take 20 mg by mouth every evening. , Disp: , Rfl:  .  Cholecalciferol (VITAMIN D) 2000 units CAPS, Take 2,000 Units by mouth daily.,  Disp: , Rfl:  .  Glucosamine HCl (GLUCOSAMINE PO), Take 1 tablet by mouth 2 (two) times daily., Disp: , Rfl:  .  Multiple Vitamins-Minerals (MULTIVITAMIN PO), Take 1 tablet by mouth daily., Disp: , Rfl:  .  Multiple Vitamins-Minerals (PRESERVISION AREDS PO), Take 1 capsule by mouth 2 (two) times daily. , Disp: , Rfl:  .  omeprazole (PRILOSEC) 20 MG capsule, Take 20 mg by mouth daily., Disp: , Rfl:   Allergies: No Known Allergies  Past Medical History, Surgical history, Social history, and Family History were reviewed and updated.  Review of Systems: Review of Systems  Constitutional: Negative for appetite change, fatigue, fever and unexpected weight change.  HENT:   Negative for lump/mass, mouth sores, sore throat and trouble swallowing.   Respiratory: Negative for cough, hemoptysis and shortness of breath.   Cardiovascular: Negative for leg swelling and palpitations.  Gastrointestinal: Negative for abdominal distention, abdominal pain, blood in stool, constipation, diarrhea, nausea and vomiting.  Genitourinary: Negative for bladder incontinence, dysuria, frequency and hematuria.   Musculoskeletal: Negative for arthralgias, back pain, gait problem and myalgias.  Skin: Negative for itching and rash.  Neurological: Negative for dizziness, extremity weakness, gait problem, headaches, numbness, seizures and speech difficulty.  Hematological: Does not bruise/bleed easily.  Psychiatric/Behavioral: Negative for  depression and sleep disturbance. The patient is not nervous/anxious.     Physical Exam:  weight is 169 lb 8 oz (76.9 kg). His temperature is 98.3 F (36.8 C). His blood pressure is 132/83 and his pulse is 84. His respiration is 17 and oxygen saturation is 100%.   Wt Readings from Last 3 Encounters:  10/29/18 169 lb 8 oz (76.9 kg)  09/06/18 168 lb 1.9 oz (76.3 kg)  08/27/18 162 lb (73.5 kg)    Physical Exam Vitals signs reviewed.  HENT:     Head: Normocephalic and atraumatic.   Eyes:     Pupils: Pupils are equal, round, and reactive to light.  Neck:     Musculoskeletal: Normal range of motion.  Cardiovascular:     Rate and Rhythm: Normal rate and regular rhythm.     Heart sounds: Normal heart sounds.  Pulmonary:     Effort: Pulmonary effort is normal.     Breath sounds: Normal breath sounds.  Abdominal:     General: Bowel sounds are normal.     Palpations: Abdomen is soft.  Musculoskeletal: Normal range of motion.        General: No tenderness or deformity.  Lymphadenopathy:     Cervical: No cervical adenopathy.  Skin:    General: Skin is warm and dry.     Findings: No erythema or rash.  Neurological:     Mental Status: He is alert and oriented to person, place, and time.  Psychiatric:        Behavior: Behavior normal.        Thought Content: Thought content normal.        Judgment: Judgment normal.      Lab Results  Component Value Date   WBC 6.8 10/29/2018   HGB 13.8 10/29/2018   HCT 40.8 10/29/2018   MCV 90.5 10/29/2018   PLT 141 (L) 10/29/2018     Chemistry      Component Value Date/Time   NA 141 10/29/2018 0807   NA 141 08/08/2017 0802   K 4.3 10/29/2018 0807   K 3.7 08/08/2017 0802   CL 102 10/29/2018 0807   CL 105 08/08/2017 0802   CO2 31 10/29/2018 0807   CO2 29 08/08/2017 0802   BUN 21 10/29/2018 0807   BUN 15 08/08/2017 0802   CREATININE 1.27 (H) 10/29/2018 0807   CREATININE 1.1 08/08/2017 0802      Component Value Date/Time   CALCIUM 9.6 10/29/2018 0807   CALCIUM 9.2 08/08/2017 0802   ALKPHOS 69 10/29/2018 0807   ALKPHOS 86 (H) 08/08/2017 0802   AST 26 10/29/2018 0807   ALT 21 10/29/2018 0807   ALT 64 (H) 08/08/2017 0802   BILITOT 0.6 10/29/2018 0807       Impression and Plan: Jack Gray is a 73 year old white male.  He had a stage IIIb adenocarcinoma of the sigmoid colon.  He had this resected.  Even though he had negative lymph nodes, he had 2 nodules which may have been lymph nodes.  He received adjuvant  chemotherapy with FOLFOX.  He then had this recurrence which was fairly early after completing the FOLFOX chemotherapy.  Thankfully, he had a solitary recurrence.  He had this resected.  At this point, I will have to speak to my expert use down at [facility].  I know that she will give me some good advice.  Again, I think the options are local therapy with stereotactic radiosurgery or systemic therapy.  Since he is  BRAF mutated, we can use the targeted approach which has worked incredibly well.  I spent about 45 minutes with Mr. Stecher today.  His wife was listening on his cell phone.  I reviewed all of his scans.  I reviewed the lab work.  All the time spent face-to-face.  I counseled him and we will have to see about a follow-up appointment.     Lattie Haw, MD

## 2018-11-06 ENCOUNTER — Other Ambulatory Visit: Payer: Self-pay

## 2018-11-06 ENCOUNTER — Encounter: Payer: Self-pay | Admitting: Radiation Oncology

## 2018-11-06 ENCOUNTER — Ambulatory Visit
Admission: RE | Admit: 2018-11-06 | Discharge: 2018-11-06 | Disposition: A | Discharge status: Home / Self Care | Payer: Medicare Other | Source: Ambulatory Visit | Attending: Radiation Oncology | Admitting: Radiation Oncology

## 2018-11-06 ENCOUNTER — Inpatient Hospital Stay
Admission: RE | Admit: 2018-11-06 | Discharge: 2018-11-06 | Disposition: A | Discharge status: Home / Self Care | Payer: Medicare Other | Source: Ambulatory Visit | Attending: Radiation Oncology | Admitting: Radiation Oncology

## 2018-11-06 VITALS — BP 143/94 | HR 86 | Temp 98.0°F | Resp 18 | Ht 68.0 in | Wt 167.5 lb

## 2018-11-06 DIAGNOSIS — C7801 Secondary malignant neoplasm of right lung: Secondary | ICD-10-CM | POA: Diagnosis not present

## 2018-11-06 DIAGNOSIS — C187 Malignant neoplasm of sigmoid colon: Secondary | ICD-10-CM | POA: Diagnosis not present

## 2018-11-06 DIAGNOSIS — D649 Anemia, unspecified: Secondary | ICD-10-CM | POA: Insufficient documentation

## 2018-11-06 DIAGNOSIS — Z9049 Acquired absence of other specified parts of digestive tract: Secondary | ICD-10-CM | POA: Diagnosis not present

## 2018-11-06 DIAGNOSIS — Z79899 Other long term (current) drug therapy: Secondary | ICD-10-CM | POA: Diagnosis not present

## 2018-11-06 DIAGNOSIS — R911 Solitary pulmonary nodule: Secondary | ICD-10-CM

## 2018-11-06 NOTE — Patient Instructions (Signed)
Coronavirus (COVID-19) Are you at risk?  Are you at risk for the Coronavirus (COVID-19)?  To be considered HIGH RISK for Coronavirus (COVID-19), you have to meet the following criteria:  . Traveled to [place], [place], [name], [place] or [place]; or in the [place] to [place], [place], [place], or [place]; and have fever, cough, and shortness of breath within the last 2 weeks of travel OR . Been in close contact with a person diagnosed with COVID-19 within the last 2 weeks and have fever, cough, and shortness of breath . IF YOU DO NOT MEET THESE CRITERIA, YOU ARE CONSIDERED LOW RISK FOR COVID-19.  What to do if you are HIGH RISK for COVID-19?  . If you are having a medical emergency, call 911. . Seek medical care right away. Before you go to a doctor's office, urgent care or emergency department, call ahead and tell them about your recent travel, contact with someone diagnosed with COVID-19, and your symptoms. You should receive instructions from your physician's office regarding next steps of care.  . When you arrive at healthcare provider, tell the healthcare staff immediately you have returned from visiting [place], [place], [place], [place] or [name]; or traveled in the [place] to [place], [place], [place], or [place]; in the last two weeks or you have been in close contact with a person diagnosed with COVID-19 in the last 2 weeks.   . Tell the health care staff about your symptoms: fever, cough and shortness of breath. . After you have been seen by a medical provider, you will be either: o Tested for (COVID-19) and discharged home on quarantine except to seek medical care if symptoms worsen, and asked to  - Stay home and avoid contact with others until you get your results (4-5 days)  - Avoid travel on public transportation if possible (such as bus, train, or airplane) or o Sent to the Emergency Department by EMS for evaluation, COVID-19 testing, and possible  admission depending on your condition and test results.  What to do if you are LOW RISK for COVID-19?  Reduce your risk of any infection by using the same precautions used for avoiding the common cold or flu:  . Wash your hands often with soap and warm water for at least 20 seconds.  If soap and water are not readily available, use an alcohol-based hand sanitizer with at least 60% alcohol.  . If coughing or sneezing, cover your mouth and nose by coughing or sneezing into the elbow areas of your shirt or coat, into a tissue or into your sleeve (not your hands). . Avoid shaking hands with others and consider head nods or verbal greetings only. . Avoid touching your eyes, nose, or mouth with unwashed hands.  . Avoid close contact with people who are sick. . Avoid places or events with large numbers of people in one location, like concerts or sporting events. . Carefully consider travel plans you have or are making. . If you are planning any travel outside or inside the [place], visit the CDC's Travelers' Health webpage for the latest health notices. . If you have some symptoms but not all symptoms, continue to monitor at home and seek medical attention if your symptoms worsen. . If you are having a medical emergency, call 911.   ADDITIONAL HEALTHCARE OPTIONS FOR PATIENTS  [facility] / e-Visit: [email or web address]         [facility]: [phone]  [facility]: [phone]                   [facility]: [phone]   

## 2018-11-06 NOTE — Progress Notes (Signed)
Radiation Oncology         (336) [phone] ________________________________  Initial Outpatient Consultation  Name: Jack Gray MRN: [id number]  Date: 11/06/2018  DOB: 16-Jul-1946  AX:KPVV, Edwyna Shell, MD  Volanda Napoleon, MD   REFERRING PHYSICIAN: Volanda Napoleon, MD  DIAGNOSIS: The primary encounter diagnosis was Cancer of sigmoid colon [facility]). A diagnosis of Solitary pulmonary nodule was also pertinent to this visit.   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 male who is presenting to the office today for evaluation of primary adenocarcinoma of the sigmoid colon metastatic to the lung. He most recently met with Dr. Marin Olp 10/29/18.The pt began with a stage IIIb adenocarcinoma of the sigmoid colon and had this resected (partial colectomy 05/30/17).Even though he had negative lymph nodes, he had 2 nodules which may have been lymph nodes. He received adjuvant chemotherapy with FOLFOX. He then had a solitary pulmonary recurrence which was fairly early after completing the FOLFOX chemotherapy. He had this resected. He now presents with a new 1.0 cm mass in the right upper lobe.  After continued growth of solitary left pulmonary nodule on CT scans in October and November, he had a PET scan 07/16/18 which showed he left lower lobe pulmonary nodule had a maximum SUV of 1.8. Given the progressive enlargement over the last 5 months, the lesion was still considered highly suspicious for malignancy and sample was warranted. No other regions of suspicion for active malignancy were identified.  Accordingly, he underwent a left lung wedge resection on 08/08/18. Pathology revealed the solitary mass to be adenocarcinoma with mucinous features, 1.1 cm in size, consistent with metastatic colorectal adenocarcinoma. Margins were not involved.  Chest/abdominal/pelvic CT with contrast on 10/29/18  showed a solitary new 1.0 cm right upper lobe solid pulmonary nodule, compatible with isolated pulmonary metastasis. No additional evidence of metastatic disease. No evidence of local tumor recurrence status post partial distal colectomy.  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   PREVIOUS RADIATION THERAPY: No  PAST MEDICAL HISTORY:  has a past medical history of Anemia, Cancer of sigmoid colon ([place]) (05/30/2017), Counseling regarding goals of care (06/18/2017), History of kidney stones, and Pneumonia.    PAST SURGICAL HISTORY: Past Surgical History:  Procedure Laterality Date   CARDIAC CATHETERIZATION     at age 79-negative   cyctoscopy     for stones   HERNIA REPAIR     r and left inguinal  30 years ago   PARTIAL COLECTOMY Left 05/30/2017   Procedure: LAPAROSCOPIC ASSISTED LEFT COLECTOMY;  Surgeon: Fanny Skates, MD;  Location: [facility];  Service: General;  Laterality: Left;  GENERAL AND TAP BLOCK    PORTACATH PLACEMENT Right 06/11/2017   Procedure: INSERTION PORT-A-CATH;  Surgeon: Fanny Skates, MD;  Location: [facility];  Service: General;  Laterality: Right;   SHOULDER SURGERY     Bil   VIDEO ASSISTED THORACOSCOPY (VATS)/WEDGE RESECTION Left 08/08/2018   Procedure: VIDEO ASSISTED THORACOSCOPY (VATS)/WEDGE RESECTION LEFT LOWER LOBE, LYMPH NODE DISSECTION;  Surgeon: [name], MD;  Location: [facility];  Service: Thoracic;  Laterality: Left;    FAMILY HISTORY: family history is not on file.  SOCIAL HISTORY:  reports that he has never smoked. He has never used  smokeless tobacco. He reports current alcohol use. He reports that he does not use drugs.  ALLERGIES: Patient has no known allergies.  MEDICATIONS:  Current Outpatient Medications  Medication Sig Dispense Refill   atorvastatin (LIPITOR) 20 MG tablet Take 20 mg  by mouth every evening.      Cholecalciferol (VITAMIN D) 2000 units CAPS Take 2,000 Units by mouth daily.     Glucosamine HCl (GLUCOSAMINE PO) Take 1 tablet by mouth 2 (two) times daily.     Multiple Vitamins-Minerals (MULTIVITAMIN PO) Take 1 tablet by mouth daily.     Multiple Vitamins-Minerals (PRESERVISION AREDS PO) Take 1 capsule by mouth 2 (two) times daily.      omeprazole (PRILOSEC) 20 MG capsule Take 20 mg by mouth daily.     No current facility-administered medications for this encounter.     REVIEW OF SYSTEMS:  A 10+ POINT REVIEW OF SYSTEMS WAS OBTAINED including neurology, dermatology, psychiatry, cardiac, respiratory, lymph, extremities, GI, GU, musculoskeletal, constitutional, reproductive, HEENT. All pertinent positives are noted in the HPI. All others are negative.    PHYSICAL EXAM:  height is _0  (1.727 m) and weight is 167 lb 8 oz (76 kg). His oral temperature is 98 F (36.7 C). His blood pressure is 143/94 (abnormal) and his pulse is 86. His respiration is 18 and oxygen saturation is 98%.   General: Alert and oriented, in no acute distress HEENT: Head is normocephalic. Extraocular movements are intact. Oropharynx is clear. Neck: Neck is supple, no palpable cervical or supraclavicular lymphadenopathy. Heart: Regular in rate and rhythm with no murmurs, rubs, or gallops. Chest: Clear to auscultation bilaterally, with no rhonchi, wheezes, or rales. Small scar from his left video-assisted thoracoscopy. Abdomen: Soft, nontender, nondistended, with no rigidity or guarding. Small scar in the lower abdomen from his laparoscopic assisted left colectomy.   Extremities: No cyanosis or edema. Lymphatics: see Neck Exam Skin: No concerning lesions. Musculoskeletal: symmetric strength and muscle tone throughout. Neurologic: Cranial nerves II through XII are grossly intact. No obvious focalities. Speech is fluent. Coordination is intact. Psychiatric: Judgment and insight are intact.  Affect is appropriate.   ECOG = 0  0 - Asymptomatic (Fully active, able to carry on all predisease activities without restriction)  1 - Symptomatic but completely ambulatory (Restricted in physically strenuous activity but ambulatory and able to carry out work of a light or sedentary nature. For example, light housework, office work)  2 - Symptomatic, <50% in bed during the day (Ambulatory and capable of all self care but unable to carry out any work activities. Up and about more than 50% of waking hours)  3 - Symptomatic, >50% in bed, but not bedbound (Capable of only limited self-care, confined to bed or chair 50% or more of waking hours)  4 - Bedbound (Completely disabled. Cannot carry on any self-care. Totally confined to bed or chair)  5 - Death   Eustace Pen MM, Creech RH, Tormey DC, et al. [phone]). "Toxicity and response criteria of the [facility]". [place] Oncol. 5 (6): 649-55  LABORATORY DATA:  Lab Results  Component Value Date   WBC 6.8 10/29/2018   HGB 13.8 10/29/2018   HCT 40.8 10/29/2018   MCV 90.5 10/29/2018   PLT 141 (L) 10/29/2018   NEUTROABS 4.0 10/29/2018   Lab Results  Component Value Date   NA 141 10/29/2018   K 4.3 10/29/2018   CL 102 10/29/2018   CO2 31 10/29/2018   GLUCOSE 118 (H) 10/29/2018  CREATININE 1.27 (H) 10/29/2018   CALCIUM 9.6 10/29/2018      RADIOGRAPHY: Ct Chest W Contrast  Result Date: 10/29/2018 CLINICAL DATA:  Stage IV sigmoid colon cancer status post wedge resection of solitary left lower lobe pulmonary metastasis on 08/08/2018. EXAM: CT CHEST, ABDOMEN, AND PELVIS WITH CONTRAST TECHNIQUE: Multidetector CT imaging of the chest, abdomen and pelvis was performed following the standard protocol during bolus administration of intravenous contrast. CONTRAST:  135m OMNIPAQUE IOHEXOL 300 MG/ML  SOLN COMPARISON:  07/16/2018 PET-CT. 05/14/2018 CT chest, abdomen and pelvis. 06/27/2018 chest CT. FINDINGS: CT CHEST FINDINGS  Cardiovascular: Normal heart size. No significant pericardial effusion/thickening. Left anterior descending coronary atherosclerosis. Atherosclerotic nonaneurysmal thoracic aorta. Normal caliber pulmonary arteries. No central pulmonary emboli. Mediastinum/Nodes: No discrete thyroid nodules. Unremarkable esophagus. No pathologically enlarged axillary, mediastinal or hilar lymph nodes. Lungs/Pleura: No pneumothorax. No pleural effusion. No acute consolidative airspace disease or lung masses. There is a solitary new solid 1.0 cm right upper lobe pulmonary nodule (series 6/image 80). No additional significant pulmonary nodules. Interval medial left lower lobe wedge resection. Small parenchymal bands in the lower lobes are compatible with mild scarring. Musculoskeletal: No aggressive appearing focal osseous lesions. Mild thoracic spondylosis. CT ABDOMEN PELVIS FINDINGS Hepatobiliary: Normal liver size. Hypodense 0.4 cm peripheral right liver lobe lesion (series 2/image 59), too small to characterize, stable since 11/13/2017 CT, considered benign. No new liver lesions. Normal gallbladder with no radiopaque cholelithiasis. No biliary ductal dilatation. Pancreas: Normal, with no mass or duct dilation. Spleen: Normal size. No mass. Adrenals/Urinary Tract: Normal adrenals. No hydronephrosis. Several nonobstructing stones scattered throughout both kidneys, largest 4 mm in the upper right kidney and 3 mm in the upper left kidney. No renal masses. Normal bladder. Stomach/Bowel: Normal non-distended stomach. Normal caliber small bowel with no small bowel wall thickening. Normal appendix. Stable postsurgical changes from partial distal colectomy with no large bowel wall thickening or significant pericolonic fat stranding. Oral contrast transits to the rectum. Mild left colonic diverticulosis. Vascular/Lymphatic: Atherosclerotic nonaneurysmal abdominal aorta. Patent portal, splenic, hepatic and renal veins. No pathologically  enlarged lymph nodes in the abdomen or pelvis. Reproductive: Mild prostatomegaly with nonspecific internal prostatic calcifications. Other: No pneumoperitoneum, ascites or focal fluid collection. Musculoskeletal: No aggressive appearing focal osseous lesions. Moderate lumbar spondylosis. IMPRESSION: 1. Solitary new 1.0 cm right upper lobe solid pulmonary nodule, compatible with isolated pulmonary metastasis. 2. No additional evidence of metastatic disease. 3. No evidence of local tumor recurrence status post partial distal colectomy. 4. Aortic Atherosclerosis (ICD10-I70.0). Additional chronic findings as detailed. Electronically Signed   By: JIlona SorrelM.D.   On: 10/29/2018 09:36   Ct Abdomen Pelvis W Contrast  Result Date: 10/29/2018 CLINICAL DATA:  Stage IV sigmoid colon cancer status post wedge resection of solitary left lower lobe pulmonary metastasis on 08/08/2018. EXAM: CT CHEST, ABDOMEN, AND PELVIS WITH CONTRAST TECHNIQUE: Multidetector CT imaging of the chest, abdomen and pelvis was performed following the standard protocol during bolus administration of intravenous contrast. CONTRAST:  1061mOMNIPAQUE IOHEXOL 300 MG/ML  SOLN COMPARISON:  07/16/2018 PET-CT. 05/14/2018 CT chest, abdomen and pelvis. 06/27/2018 chest CT. FINDINGS: CT CHEST FINDINGS Cardiovascular: Normal heart size. No significant pericardial effusion/thickening. Left anterior descending coronary atherosclerosis. Atherosclerotic nonaneurysmal thoracic aorta. Normal caliber pulmonary arteries. No central pulmonary emboli. Mediastinum/Nodes: No discrete thyroid nodules. Unremarkable esophagus. No pathologically enlarged axillary, mediastinal or hilar lymph nodes. Lungs/Pleura: No pneumothorax. No pleural effusion. No acute consolidative airspace disease or lung masses. There is a solitary new solid 1.0 cm right  upper lobe pulmonary nodule (series 6/image 80). No additional significant pulmonary nodules. Interval medial left lower lobe wedge  resection. Small parenchymal bands in the lower lobes are compatible with mild scarring. Musculoskeletal: No aggressive appearing focal osseous lesions. Mild thoracic spondylosis. CT ABDOMEN PELVIS FINDINGS Hepatobiliary: Normal liver size. Hypodense 0.4 cm peripheral right liver lobe lesion (series 2/image 59), too small to characterize, stable since 11/13/2017 CT, considered benign. No new liver lesions. Normal gallbladder with no radiopaque cholelithiasis. No biliary ductal dilatation. Pancreas: Normal, with no mass or duct dilation. Spleen: Normal size. No mass. Adrenals/Urinary Tract: Normal adrenals. No hydronephrosis. Several nonobstructing stones scattered throughout both kidneys, largest 4 mm in the upper right kidney and 3 mm in the upper left kidney. No renal masses. Normal bladder. Stomach/Bowel: Normal non-distended stomach. Normal caliber small bowel with no small bowel wall thickening. Normal appendix. Stable postsurgical changes from partial distal colectomy with no large bowel wall thickening or significant pericolonic fat stranding. Oral contrast transits to the rectum. Mild left colonic diverticulosis. Vascular/Lymphatic: Atherosclerotic nonaneurysmal abdominal aorta. Patent portal, splenic, hepatic and renal veins. No pathologically enlarged lymph nodes in the abdomen or pelvis. Reproductive: Mild prostatomegaly with nonspecific internal prostatic calcifications. Other: No pneumoperitoneum, ascites or focal fluid collection. Musculoskeletal: No aggressive appearing focal osseous lesions. Moderate lumbar spondylosis. IMPRESSION: 1. Solitary new 1.0 cm right upper lobe solid pulmonary nodule, compatible with isolated pulmonary metastasis. 2. No additional evidence of metastatic disease. 3. No evidence of local tumor recurrence status post partial distal colectomy. 4. Aortic Atherosclerosis (ICD10-I70.0). Additional chronic findings as detailed. Electronically Signed   By: Ilona Sorrel M.D.   On:  10/29/2018 09:36      IMPRESSION: Stage IIIB (pT3, pN1c, M0) adenocarcinoma of the sigmoid colon, now will oligometastasis in the right lung  Patient would be a good candidate for SBRT directed at his solitary metastasis in the right lung. I briefly discussed consideration for additional surgery but the patient is hesitant to consider additional surgery in light of his rapid recurrence. Per discussion with the pt at outside consultation at [facility], recommended SBRT and not additional surgery.    Today, I talked to the patient about the findings and work-up thus far.  We discussed the natural history of his disease and general treatment, highlighting the role of radiotherapy (SBRT) in the management.  We discussed the available radiation techniques, and focused on the details of logistics and delivery.  We reviewed the anticipated acute and late sequelae associated with radiation in this setting.  The patient was encouraged to ask questions that I answered to the best of my ability.  A patient consent form was discussed and signed.  We retained a copy for our records.  The patient would like to proceed with radiation and will be scheduled for CT simulation.   PLAN:SBRT simulation tomorrow at 1:30PM.  Anticipate 3 treatments.   ------------------------------------------------  Blair Promise, PhD, MD      This document serves as a record of services personally performed by Gery Pray, MD. It was created on his behalf by Mary-Margaret Loma Messing, a trained medical scribe. The creation of this record is based on the scribe's personal observations and the provider's statements to them. This document has been checked and approved by the attending provider.

## 2018-11-06 NOTE — Progress Notes (Signed)
Thoracic Location of Tumor / Histology: Principle Diagnosis:  Stage IIIB (T3N1cM0) adenocarcinoma of the sigmoid colon -- BRAF (+)  -- metastatic to lung  CT CHEST W CONTRAST 10/29/18:  IMPRESSION: 1. Solitary new 1.0 cm right upper lobe solid pulmonary nodule, compatible with isolated pulmonary metastasis. 2. No additional evidence of metastatic disease. 3. No evidence of local tumor recurrence status post partial distal Colectomy.  Biopsies of LLL revealed: 08/08/18:  Diagnosis 1. Lung, wedge biopsy/resection, Left Lower Lobe - ADENOCARCINOMA WITH MUCINOUS FEATURES, 1.1 CM CONSISTENT WITH METASTATIC COLORECTAL ADENOCARCINOMA.  Tobacco/Marijuana/Snuff/ETOH use: Never smoked. Childhood exposure to second hand smoke. Occasional/social ETOH. No illicit drugs, marijuana, smokeless tobacco, snuff.  Past/Anticipated interventions by cardiothoracic surgery, if any: 08/08/18:  PROCEDURE:   Left video-assisted thoracoscopy, Wedge resection of left lower lobe nodule, Lymph node sampling.  SURGEON:  Modesto Charon, MD  Past/Anticipated interventions by medical oncology, if any: Per Dr. Marin Olp 10/29/18:  Impression and Plan: Jack Gray is a 73 year old white male.  He had a stage IIIb adenocarcinoma of the sigmoid colon.  He had this resected.  Even though he had negative lymph nodes, he had 2 nodules which may have been lymph nodes.  He received adjuvant chemotherapy with FOLFOX.  He then had this recurrence which was fairly early after completing the FOLFOX chemotherapy.  Thankfully, he had a solitary recurrence.  He had this resected.  At this point, I will have to speak to my expert use down at [facility].  I know that she will give me some good advice.  Again, I think the options are local therapy with stereotactic radiosurgery or systemic therapy.  Since he is BRAF mutated, we can use the targeted approach which has worked incredibly well.  I spent about 45 minutes with Jack Gray  today.  His wife was listening on his cell phone.  I reviewed all of his scans.  I reviewed the lab work.  All the time spent face-to-face.  I counseled him and we will have to see about a follow-up appointment  Signs/Symptoms  Weight changes, if any: Negative for appetite change, fatigue, fever and unexpected weight change.   Respiratory complaints, if any: He has had no cough.  He says sometimes when he takes a deep breath then he has little bit of discomfort over on the left side after his surgery.  Hemoptysis, if any: Negative for cough, hemoptysis and shortness of breath.   Pain issues, if any:  Pt denies c/o pain.   SAFETY ISSUES:  Prior radiation? No  Pacemaker/ICD? No   Possible current pregnancy? No  Is the patient on methotrexate? No  Current Complaints / other details:  Pt presents today for initial consult with Dr. Sondra Come for Radiation Oncology. Pt is unaccompanied.   BP (!) 143/94 (BP Location: Left Arm, Patient Position: Sitting)   Pulse 86   Temp 98 F (36.7 C) (Oral)   Resp 18   Ht 5' 8"  (1.727 m)   Wt 167 lb 8 oz (76 kg)   SpO2 98%   BMI 25.47 kg/m   Wt Readings from Last 3 Encounters:  11/06/18 167 lb 8 oz (76 kg)  10/29/18 169 lb 8 oz (76.9 kg)  09/06/18 168 lb 1.9 oz (76.3 kg)   Loma Sousa, RN BSN

## 2018-11-07 ENCOUNTER — Ambulatory Visit
Admission: RE | Admit: 2018-11-07 | Discharge: 2018-11-07 | Disposition: A | Discharge status: Home / Self Care | Payer: Medicare Other | Source: Ambulatory Visit | Attending: Radiation Oncology | Admitting: Radiation Oncology

## 2018-11-07 ENCOUNTER — Other Ambulatory Visit: Payer: Self-pay

## 2018-11-07 DIAGNOSIS — C7801 Secondary malignant neoplasm of right lung: Secondary | ICD-10-CM | POA: Insufficient documentation

## 2018-11-07 DIAGNOSIS — C187 Malignant neoplasm of sigmoid colon: Secondary | ICD-10-CM | POA: Insufficient documentation

## 2018-11-07 DIAGNOSIS — Z510 Encounter for antineoplastic radiation therapy: Secondary | ICD-10-CM | POA: Insufficient documentation

## 2018-11-07 DIAGNOSIS — R911 Solitary pulmonary nodule: Secondary | ICD-10-CM

## 2018-11-07 NOTE — Progress Notes (Signed)
  Radiation Oncology         (336) [phone] ________________________________  Name: Jack Gray MRN: [id number]  Date: 11/07/2018  DOB: 03/15/1946   STEREOTACTIC BODY RADIOTHERAPY SIMULATION AND TREATMENT PLANNING NOTE    DIAGNOSIS:  73 y.o. 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  Identity was confirmed.  All relevant records and images related to the planned course of therapy were reviewed.  The patient freely provided informed written consent to proceed with treatment after reviewing the details related to the planned course of therapy. The consent form was witnessed and verified by the simulation staff.  Then, the patient was set-up in a stable reproducible  supine position for radiation therapy.  A BodyFix immobilization pillow was fabricated for reproducible positioning.  Then I personally applied the abdominal compression paddle to limit respiratory excursion.  4D respiratoy motion management CT images were obtained.  Surface markings were placed.  The CT images were loaded into the planning software.  Then, using Cine, MIP, and standard views, the internal target volume (ITV) and planning target volumes (PTV) were delinieated, and avoidance structures were contoured.  Treatment planning then occurred.  The radiation prescription was entered and confirmed.  A total of two complex treatment devices were fabricated in the form of the BodyFix immobilization pillow and a neck accuform cushion.  I have requested : 3D Simulation  I have requested a DVH of the following structures: Heart, Lungs, Esophagus, Chest Wall, Brachial Plexus, Major Blood Vessels, and targets.  PLAN:  The patient will receive 54 Gy in 3 fractions.  -----------------------------------  Blair Promise, PhD, MD  This document serves as a record of services personally performed  by Gery Pray, MD. It was created on his behalf by Rae Lips, a trained medical scribe. The creation of this record is based on the scribe's personal observations and the provider's statements to them. This document has been checked and approved by the attending provider.

## 2018-11-14 DIAGNOSIS — C7801 Secondary malignant neoplasm of right lung: Secondary | ICD-10-CM | POA: Diagnosis not present

## 2018-11-14 DIAGNOSIS — Z510 Encounter for antineoplastic radiation therapy: Secondary | ICD-10-CM | POA: Diagnosis not present

## 2018-11-14 DIAGNOSIS — C187 Malignant neoplasm of sigmoid colon: Secondary | ICD-10-CM | POA: Diagnosis not present

## 2018-11-18 ENCOUNTER — Other Ambulatory Visit: Payer: Self-pay

## 2018-11-18 ENCOUNTER — Ambulatory Visit
Admission: RE | Admit: 2018-11-18 | Discharge: 2018-11-18 | Disposition: A | Discharge status: Home / Self Care | Payer: Medicare Other | Source: Ambulatory Visit | Attending: Radiation Oncology | Admitting: Radiation Oncology

## 2018-11-18 DIAGNOSIS — R911 Solitary pulmonary nodule: Secondary | ICD-10-CM

## 2018-11-18 DIAGNOSIS — C187 Malignant neoplasm of sigmoid colon: Secondary | ICD-10-CM | POA: Diagnosis not present

## 2018-11-18 DIAGNOSIS — C7801 Secondary malignant neoplasm of right lung: Secondary | ICD-10-CM | POA: Diagnosis not present

## 2018-11-18 DIAGNOSIS — Z510 Encounter for antineoplastic radiation therapy: Secondary | ICD-10-CM | POA: Diagnosis not present

## 2018-11-20 ENCOUNTER — Ambulatory Visit
Admission: RE | Admit: 2018-11-20 | Discharge: 2018-11-20 | Disposition: A | Discharge status: Home / Self Care | Payer: Medicare Other | Source: Ambulatory Visit | Attending: Radiation Oncology | Admitting: Radiation Oncology

## 2018-11-20 ENCOUNTER — Other Ambulatory Visit: Payer: Self-pay

## 2018-11-20 DIAGNOSIS — C7801 Secondary malignant neoplasm of right lung: Secondary | ICD-10-CM | POA: Diagnosis not present

## 2018-11-20 DIAGNOSIS — Z510 Encounter for antineoplastic radiation therapy: Secondary | ICD-10-CM | POA: Diagnosis not present

## 2018-11-20 DIAGNOSIS — R911 Solitary pulmonary nodule: Secondary | ICD-10-CM

## 2018-11-20 DIAGNOSIS — C187 Malignant neoplasm of sigmoid colon: Secondary | ICD-10-CM | POA: Diagnosis not present

## 2018-11-20 NOTE — Progress Notes (Signed)
  Radiation Oncology         [phone] ________________________________  Name: Jack Gray MRN: [id number]  Date: 11/20/2018  DOB: 24-Jun-1946  Stereotactic Body Radiotherapy Treatment Procedure Note  NARRATIVE:  Jack Gray was brought to the stereotactic radiation treatment machine and placed supine on the CT couch. The patient was set up for stereotactic body radiotherapy on the body fix pillow.  3D TREATMENT PLANNING AND DOSIMETRY:  The patient's radiation plan was reviewed and approved prior to starting treatment.  It showed 3-dimensional radiation distributions overlaid onto the planning CT.  The [facility] for the target structures as well as the organs at risk were reviewed. The documentation of this is filed in the radiation oncology EMR.  SIMULATION VERIFICATION:  The patient underwent CT imaging on the treatment unit.  These were carefully aligned to document that the ablative radiation dose would cover the target volume and maximally spare the nearby organs at risk according to the planned distribution.  SPECIAL TREATMENT PROCEDURE: Jack Gray received high dose ablative stereotactic body radiotherapy to the planned target volume without unforeseen complications. Treatment was delivered uneventfully. The high doses associated with stereotactic body radiotherapy and the significant potential risks require careful treatment set up and patient monitoring constituting a special treatment procedure   STEREOTACTIC TREATMENT MANAGEMENT:  Following delivery, the patient was evaluated clinically. The patient tolerated treatment without significant acute effects, and was discharged to home in stable condition.    PLAN: Continue treatment as planned.  ________________________________  Blair Promise, PhD, MD

## 2018-11-22 ENCOUNTER — Ambulatory Visit: Payer: Medicare Other | Admitting: Radiation Oncology

## 2018-11-25 ENCOUNTER — Other Ambulatory Visit: Payer: Self-pay

## 2018-11-25 ENCOUNTER — Ambulatory Visit
Admission: RE | Admit: 2018-11-25 | Discharge: 2018-11-25 | Disposition: A | Discharge status: Home / Self Care | Payer: Medicare Other | Source: Ambulatory Visit | Attending: Radiation Oncology | Admitting: Radiation Oncology

## 2018-11-25 ENCOUNTER — Encounter: Payer: Self-pay | Admitting: Radiation Oncology

## 2018-11-25 DIAGNOSIS — Z510 Encounter for antineoplastic radiation therapy: Secondary | ICD-10-CM | POA: Diagnosis not present

## 2018-11-25 DIAGNOSIS — C187 Malignant neoplasm of sigmoid colon: Secondary | ICD-10-CM | POA: Diagnosis not present

## 2018-11-25 DIAGNOSIS — R911 Solitary pulmonary nodule: Secondary | ICD-10-CM

## 2018-11-25 DIAGNOSIS — C7801 Secondary malignant neoplasm of right lung: Secondary | ICD-10-CM | POA: Diagnosis not present

## 2018-11-25 NOTE — Progress Notes (Signed)
  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 He denied experiencing any side effects-- pain, fatigue, cough, shortness of breath, pain or difficulty swallowing, skin changes, feeling of a lump in his chest.  Plan: The patient has completed radiation treatment. The patient will return to radiation oncology clinic for routine followup in one month. I advised them to call or return sooner if they have any questions or concerns related to their recovery or treatment.  -----------------------------------  Billie Lade, PhD, MD  This document serves as a record of services personally performed by Antony Blackbird, MD. It was created on his behalf by Mickie Bail, a trained medical scribe. The creation of this record is based on the scribe's personal observations and the provider's statements to them. This document has been checked and approved by the attending provider.

## 2018-11-25 NOTE — Progress Notes (Signed)
  Radiation Oncology         [phone] ________________________________  Name: Jack Gray MRN: [id number]  Date: 11/25/2018  DOB: Nov 22, 1945  Stereotactic Body Radiotherapy Treatment Procedure Note  NARRATIVE:  Jack Gray was brought to the stereotactic radiation treatment machine and placed supine on the CT couch. The patient was set up for stereotactic body radiotherapy on the body fix pillow.  3D TREATMENT PLANNING AND DOSIMETRY:  The patient's radiation plan was reviewed and approved prior to starting treatment.  It showed 3-dimensional radiation distributions overlaid onto the planning CT.  The [facility] for the target structures as well as the organs at risk were reviewed. The documentation of this is filed in the radiation oncology EMR.  SIMULATION VERIFICATION:  The patient underwent CT imaging on the treatment unit.  These were carefully aligned to document that the ablative radiation dose would cover the target volume and maximally spare the nearby organs at risk according to the planned distribution.  SPECIAL TREATMENT PROCEDURE: Jack Gray received high dose ablative stereotactic body radiotherapy to the planned target volume without unforeseen complications. Treatment was delivered uneventfully. The high doses associated with stereotactic body radiotherapy and the significant potential risks require careful treatment set up and patient monitoring constituting a special treatment procedure   STEREOTACTIC TREATMENT MANAGEMENT:  Following delivery, the patient was evaluated clinically. The patient tolerated treatment without significant acute effects, and was discharged to home in stable condition.    PLAN: Continue treatment as planned.  ________________________________  Blair Promise, PhD, MD   This document serves as a record of services personally performed by Gery Pray, MD. It was created on his behalf by Wilburn Mylar, a trained medical scribe. The creation of  this record is based on the scribe's personal observations and the provider's statements to them. This document has been checked and approved by the attending provider.

## 2018-12-03 NOTE — Progress Notes (Signed)
  Radiation Oncology         (336) [phone] ________________________________  Name: Jack Gray MRN: [id number]  Date: 11/18/2018  DOB: [birth year]  Stereotactic Body Radiotherapy Treatment Procedure Note  NARRATIVE:  GEORDIE NOONEY was brought to the stereotactic radiation treatment machine and placed supine on the CT couch. The patient was set up for stereotactic body radiotherapy on the body fix pillow.  3D TREATMENT PLANNING AND DOSIMETRY:  The patient's radiation plan was reviewed and approved prior to starting treatment.  It showed 3-dimensional radiation distributions overlaid onto the planning CT.  The [facility] for the target structures as well as the organs at risk were reviewed. The documentation of this is filed in the radiation oncology EMR.  SIMULATION VERIFICATION:  The patient underwent CT imaging on the treatment unit.  These were carefully aligned to document that the ablative radiation dose would cover the target volume and maximally spare the nearby organs at risk according to the planned distribution.  SPECIAL TREATMENT PROCEDURE: Meta Hatchet Akens received high dose ablative stereotactic body radiotherapy to the planned target volume without unforeseen complications. Treatment was delivered uneventfully. The high doses associated with stereotactic body radiotherapy and the significant potential risks require careful treatment set up and patient monitoring constituting a special treatment procedure   STEREOTACTIC TREATMENT MANAGEMENT:  Following delivery, the patient was evaluated clinically. The patient tolerated treatment without significant acute effects, and was discharged to home in stable condition.    PLAN: Continue treatment as planned.  ________________________________  Blair Promise, PhD, MD

## 2018-12-17 DIAGNOSIS — L570 Actinic keratosis: Secondary | ICD-10-CM | POA: Diagnosis not present

## 2018-12-17 DIAGNOSIS — L812 Freckles: Secondary | ICD-10-CM | POA: Diagnosis not present

## 2018-12-17 DIAGNOSIS — D485 Neoplasm of uncertain behavior of skin: Secondary | ICD-10-CM | POA: Diagnosis not present

## 2018-12-17 DIAGNOSIS — D0439 Carcinoma in situ of skin of other parts of face: Secondary | ICD-10-CM | POA: Diagnosis not present

## 2018-12-17 DIAGNOSIS — D2272 Melanocytic nevi of left lower limb, including hip: Secondary | ICD-10-CM | POA: Diagnosis not present

## 2018-12-17 DIAGNOSIS — L821 Other seborrheic keratosis: Secondary | ICD-10-CM | POA: Diagnosis not present

## 2018-12-17 DIAGNOSIS — D1801 Hemangioma of skin and subcutaneous tissue: Secondary | ICD-10-CM | POA: Diagnosis not present

## 2018-12-25 ENCOUNTER — Other Ambulatory Visit: Payer: Self-pay | Admitting: Hematology & Oncology

## 2018-12-25 ENCOUNTER — Telehealth: Payer: Self-pay | Admitting: *Deleted

## 2018-12-25 DIAGNOSIS — C187 Malignant neoplasm of sigmoid colon: Secondary | ICD-10-CM

## 2018-12-25 NOTE — Telephone Encounter (Signed)
CALLED PATIENT TO ALTER FU FOR 12-26-18, RESCHEDULED FOR 02-06-19 @ 11;30 AM , PATIENT AGREED TO NEW TIME AND DATE

## 2018-12-26 ENCOUNTER — Telehealth: Payer: Self-pay | Admitting: Hematology & Oncology

## 2018-12-26 ENCOUNTER — Ambulatory Visit: Payer: Medicare Other | Admitting: Radiation Oncology

## 2018-12-26 NOTE — Telephone Encounter (Signed)
;  vm to inform pt of 6/24 appts at 0830 per 5/20 sch msg. Also informed pt to pick up contrast for CT scans 6/24 at 9 am

## 2019-01-06 IMAGING — CR DG CHEST 1V PORT
1 series · 1 of 1 positions shown · non-contrast
Comparison: None.

CLINICAL DATA: Port-A-Cath placement.

EXAM:
PORTABLE CHEST 1 VIEW

[ap]
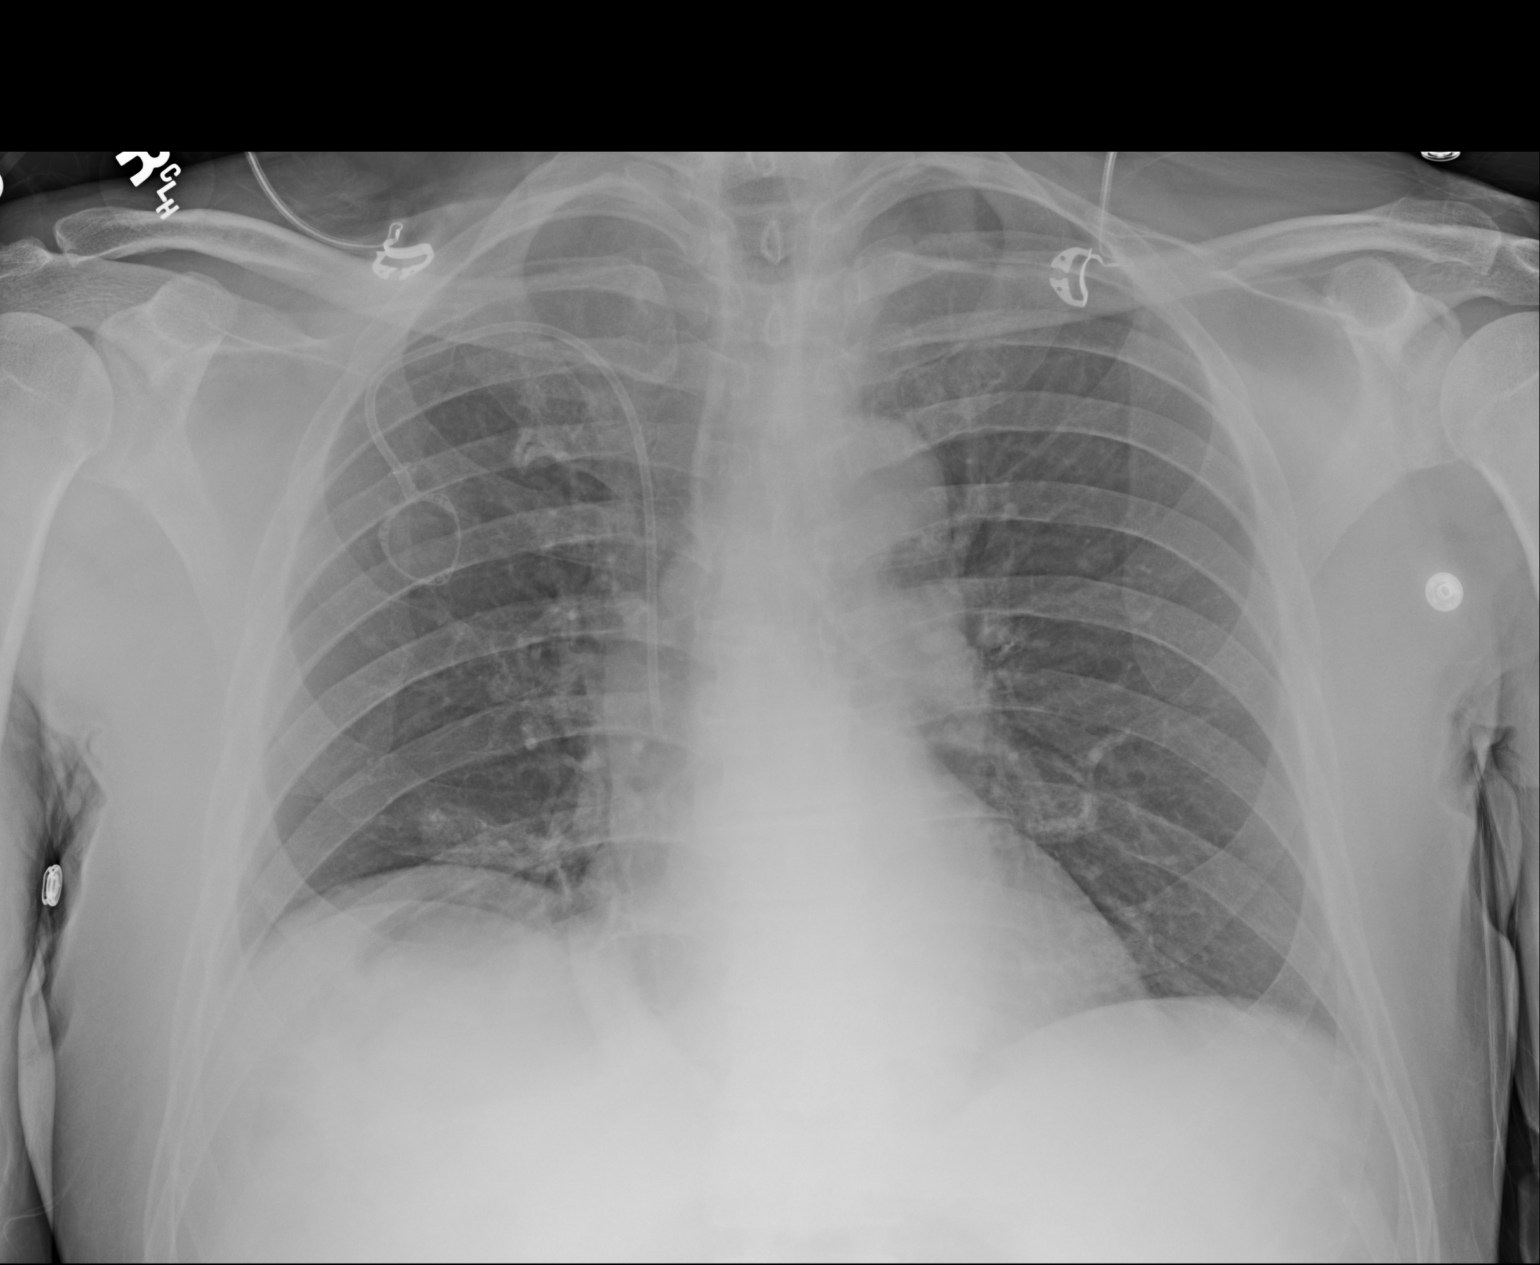

[1 of 1 positions shown; findings below may reference images not displayed]

FINDINGS: The heart size and mediastinal contours are within normal limits.
Both lungs are clear. Right subclavian Port-A-Cath is noted with
distal tip in expected position of the SVC. No pneumothorax or
pleural effusion is noted. The visualized skeletal structures are
unremarkable.
IMPRESSION: Right subclavian Port-A-Cath is noted in grossly good position. No
acute cardiopulmonary abnormality seen.

## 2019-01-28 ENCOUNTER — Other Ambulatory Visit: Payer: Self-pay | Admitting: *Deleted

## 2019-01-28 DIAGNOSIS — C187 Malignant neoplasm of sigmoid colon: Secondary | ICD-10-CM

## 2019-01-28 DIAGNOSIS — R911 Solitary pulmonary nodule: Secondary | ICD-10-CM

## 2019-01-29 ENCOUNTER — Encounter: Payer: Self-pay | Admitting: Hematology & Oncology

## 2019-01-29 ENCOUNTER — Telehealth: Payer: Self-pay | Admitting: *Deleted

## 2019-01-29 ENCOUNTER — Ambulatory Visit (HOSPITAL_BASED_OUTPATIENT_CLINIC_OR_DEPARTMENT_OTHER)
Admission: RE | Admit: 2019-01-29 | Discharge: 2019-01-29 | Disposition: A | Discharge status: Home / Self Care | Payer: Medicare Other | Source: Ambulatory Visit | Attending: Hematology & Oncology | Admitting: Hematology & Oncology

## 2019-01-29 ENCOUNTER — Inpatient Hospital Stay: Payer: Medicare Other | Attending: Hematology & Oncology | Admitting: Hematology & Oncology

## 2019-01-29 ENCOUNTER — Other Ambulatory Visit: Payer: Self-pay

## 2019-01-29 ENCOUNTER — Inpatient Hospital Stay: Payer: Medicare Other

## 2019-01-29 VITALS — BP 148/90 | HR 81 | Temp 97.9°F | Resp 20 | Wt 165.0 lb

## 2019-01-29 DIAGNOSIS — R911 Solitary pulmonary nodule: Secondary | ICD-10-CM

## 2019-01-29 DIAGNOSIS — N200 Calculus of kidney: Secondary | ICD-10-CM | POA: Diagnosis not present

## 2019-01-29 DIAGNOSIS — C187 Malignant neoplasm of sigmoid colon: Secondary | ICD-10-CM

## 2019-01-29 DIAGNOSIS — Z9221 Personal history of antineoplastic chemotherapy: Secondary | ICD-10-CM | POA: Diagnosis not present

## 2019-01-29 DIAGNOSIS — R918 Other nonspecific abnormal finding of lung field: Secondary | ICD-10-CM | POA: Insufficient documentation

## 2019-01-29 DIAGNOSIS — Z79899 Other long term (current) drug therapy: Secondary | ICD-10-CM | POA: Diagnosis not present

## 2019-01-29 DIAGNOSIS — C189 Malignant neoplasm of colon, unspecified: Secondary | ICD-10-CM | POA: Diagnosis not present

## 2019-01-29 LAB — CMP (CANCER CENTER ONLY)
ALT: 22 U/L (ref 0–44)
AST: 33 U/L (ref 15–41)
Albumin: 4.6 g/dL (ref 3.5–5.0)
Alkaline Phosphatase: 75 U/L (ref 38–126)
Anion gap: 7 (ref 5–15)
BUN: 22 mg/dL (ref 8–23)
CO2: 30 mmol/L (ref 22–32)
Calcium: 9.8 mg/dL (ref 8.9–10.3)
Chloride: 103 mmol/L (ref 98–111)
Creatinine: 1.41 mg/dL — ABNORMAL HIGH (ref 0.61–1.24)
GFR, Est AFR Am: 57 mL/min — ABNORMAL LOW (ref >60)
GFR, Estimated: 49 mL/min — ABNORMAL LOW (ref >60)
Glucose, Bld: 109 mg/dL — ABNORMAL HIGH (ref 70–99)
Potassium: 4.5 mmol/L (ref 3.5–5.1)
Sodium: 140 mmol/L (ref 135–145)
Total Bilirubin: 0.9 mg/dL (ref 0.3–1.2)
Total Protein: 6.5 g/dL (ref 6.5–8.1)

## 2019-01-29 LAB — CBC WITH DIFFERENTIAL (CANCER CENTER ONLY)
Abs Immature Granulocytes: 0.01 10*3/uL (ref 0.00–0.07)
Basophils Absolute: 0 10*3/uL (ref 0.0–0.1)
Basophils Relative: 1 %
Eosinophils Absolute: 0.1 10*3/uL (ref 0.0–0.5)
Eosinophils Relative: 2 %
HCT: 38.4 % — ABNORMAL LOW (ref 39.0–52.0)
Hemoglobin: 13.2 g/dL (ref 13.0–17.0)
Immature Granulocytes: 0 %
Lymphocytes Relative: 25 %
Lymphs Abs: 1.5 10*3/uL (ref 0.7–4.0)
MCH: 31.4 pg (ref 26.0–34.0)
MCHC: 34.4 g/dL (ref 30.0–36.0)
MCV: 91.2 fL (ref 80.0–100.0)
Monocytes Absolute: 0.7 10*3/uL (ref 0.1–1.0)
Monocytes Relative: 11 %
Neutro Abs: 3.7 10*3/uL (ref 1.7–7.7)
Neutrophils Relative %: 61 %
Platelet Count: 150 10*3/uL (ref 150–400)
RBC: 4.21 MIL/uL — ABNORMAL LOW (ref 4.22–5.81)
RDW: 12.8 % (ref 11.5–15.5)
WBC Count: 6 10*3/uL (ref 4.0–10.5)
nRBC: 0 % (ref 0.0–0.2)

## 2019-01-29 LAB — CEA (IN HOUSE-CHCC): CEA (CHCC-In House): 2.01 ng/mL (ref 0.00–5.00)

## 2019-01-29 MED ORDER — IOHEXOL 300 MG/ML  SOLN
100.0000 mL | Freq: Once | INTRAMUSCULAR | Status: AC | PRN
  Administered 2019-01-29: 100 mL via INTRAVENOUS

## 2019-01-29 NOTE — Progress Notes (Signed)
Hematology and Oncology Follow Up Visit  BASTIEN STRAWSER [id number] 29-Aug-1945 73 y.o. 01/29/2019   Principle Diagnosis:   Stage IIIB (T3N1cM0) adenocarcinoma of the sigmoid colon -- BRAF (+)  -- metastatic to lung  Current Therapy:    Status post cycle #8 of adjuvant FOLFOX (oxaliplatin d/c'ed w/  cycle #6) - completed on 10/03/2017  Resection of lung met -- solitary -- on 08/08/2018     Interim History:  Mr. Nieblas is back for follow-up.  He really looks great.  [place]  He and his wife go up there all the time.  She is not doing all that well with her knees.  He has been very active.  He walks 3 miles a day.  He is been playing golf up in the mountains.  He did complete his SBRT back on 11/25/2018.  He received 54 Gy in 3 fractions.  He tolerated this well.  We did do a follow-up CT scan on him today.  The CT scan shows stability of this right lung nodule.  It measures 1 x 0.8 cm.  There is no other evidence of active disease.  His last CEA level was normal at 1.91.  His appetite is good.  He has had no nausea or vomiting.  There is been no change in bowel or bladder habits.  Unfortunately, his daughter has not yet got married.  Because of the corona virus that had to postpone their wedding..  Overall, his performance status is ECOG 0. .   Medications:  Current Outpatient Medications:  .  atorvastatin (LIPITOR) 20 MG tablet, Take 20 mg by mouth every evening. , Disp: , Rfl:  .  b complex vitamins capsule, Take 1 capsule by mouth daily., Disp: , Rfl:  .  Cholecalciferol (VITAMIN D) 2000 units CAPS, Take 2,000 Units by mouth daily., Disp: , Rfl:  .  Collagen Hydrolysate POWD, by Does not apply route daily., Disp: , Rfl:  .  Glucosamine HCl (GLUCOSAMINE PO), Take 1 tablet by mouth 2 (two) times daily., Disp: , Rfl:  .  Multiple Vitamins-Minerals (MULTIVITAMIN PO), Take 1 tablet by mouth daily., Disp: , Rfl:  .  Multiple Vitamins-Minerals (PRESERVISION  AREDS PO), Take 1 capsule by mouth 2 (two) times daily. , Disp: , Rfl:  .  omeprazole (PRILOSEC) 20 MG capsule, Take 20 mg by mouth daily., Disp: , Rfl:   Allergies: No Known Allergies  Past Medical History, Surgical history, Social history, and Family History were reviewed and updated.  Review of Systems: Review of Systems  Constitutional: Negative for appetite change, fatigue, fever and unexpected weight change.  HENT:   Negative for lump/mass, mouth sores, sore throat and trouble swallowing.   Respiratory: Negative for cough, hemoptysis and shortness of breath.   Cardiovascular: Negative for leg swelling and palpitations.  Gastrointestinal: Negative for abdominal distention, abdominal pain, blood in stool, constipation, diarrhea, nausea and vomiting.  Genitourinary: Negative for bladder incontinence, dysuria, frequency and hematuria.   Musculoskeletal: Negative for arthralgias, back pain, gait problem and myalgias.  Skin: Negative for itching and rash.  Neurological: Negative for dizziness, extremity weakness, gait problem, headaches, numbness, seizures and speech difficulty.  Hematological: Does not bruise/bleed easily.  Psychiatric/Behavioral: Negative for depression and sleep disturbance. The patient is not nervous/anxious.     Physical Exam:  weight is 165 lb (74.8 kg). His temperature is 97.9 F (36.6 C). His blood pressure is 148/90 (abnormal) and his pulse is 81. His respiration is  20 and oxygen saturation is 100%.   Wt Readings from Last 3 Encounters:  01/29/19 165 lb (74.8 kg)  11/06/18 167 lb 8 oz (76 kg)  10/29/18 169 lb 8 oz (76.9 kg)    Physical Exam Vitals signs reviewed.  HENT:     Head: Normocephalic and atraumatic.  Eyes:     Pupils: Pupils are equal, round, and reactive to light.  Neck:     Musculoskeletal: Normal range of motion.  Cardiovascular:     Rate and Rhythm: Normal rate and regular rhythm.     Heart sounds: Normal heart sounds.  Pulmonary:      Effort: Pulmonary effort is normal.     Breath sounds: Normal breath sounds.  Abdominal:     General: Bowel sounds are normal.     Palpations: Abdomen is soft.  Musculoskeletal: Normal range of motion.        General: No tenderness or deformity.  Lymphadenopathy:     Cervical: No cervical adenopathy.  Skin:    General: Skin is warm and dry.     Findings: No erythema or rash.  Neurological:     Mental Status: He is alert and oriented to person, place, and time.  Psychiatric:        Behavior: Behavior normal.        Thought Content: Thought content normal.        Judgment: Judgment normal.      Lab Results  Component Value Date   WBC 6.0 01/29/2019   HGB 13.2 01/29/2019   HCT 38.4 (L) 01/29/2019   MCV 91.2 01/29/2019   PLT 150 01/29/2019     Chemistry      Component Value Date/Time   NA 140 01/29/2019 0823   NA 141 08/08/2017 0802   K 4.5 01/29/2019 0823   K 3.7 08/08/2017 0802   CL 103 01/29/2019 0823   CL 105 08/08/2017 0802   CO2 30 01/29/2019 0823   CO2 29 08/08/2017 0802   BUN 22 01/29/2019 0823   BUN 15 08/08/2017 0802   CREATININE 1.41 (H) 01/29/2019 0823   CREATININE 1.1 08/08/2017 0802      Component Value Date/Time   CALCIUM 9.8 01/29/2019 0823   CALCIUM 9.2 08/08/2017 0802   ALKPHOS 75 01/29/2019 0823   ALKPHOS 86 (H) 08/08/2017 0802   AST 33 01/29/2019 0823   ALT 22 01/29/2019 0823   ALT 64 (H) 08/08/2017 0802   BILITOT 0.9 01/29/2019 0823       Impression and Plan: Mr. Achor is a 73 year old white male.  He had a stage IIIb adenocarcinoma of the sigmoid colon.  He had this resected.  Even though he had negative lymph nodes, he had 2 nodules which may have been lymph nodes.  He received adjuvant chemotherapy with FOLFOX.  He then had this recurrence which was fairly early after completing the FOLFOX chemotherapy.  Thankfully, he had a solitary recurrence.  He had this resected.  He then had a second recurrence.  This was also in the lung.  He  completed SBRT in April.  We will repeat his CT scan in September.  I think this is appropriate.  I need to see if there is any changes.  Hopefully where he had the SBRT will be improved.  His quality of life is doing quite well right now.  We will keep praying for his poor wife.  Hopefully her knees will get better.    Lattie Haw, MD

## 2019-01-29 NOTE — Telephone Encounter (Signed)
Notified pt of results. Pt verbalized understanding.

## 2019-01-29 NOTE — Telephone Encounter (Signed)
-----   Message from Volanda Napoleon, MD sent at 01/29/2019  2:36 PM EDT ----- Call - the tumor marker - CEA - is normal at 2!!  [facility]

## 2019-02-04 DIAGNOSIS — D490 Neoplasm of unspecified behavior of digestive system: Secondary | ICD-10-CM | POA: Diagnosis not present

## 2019-02-04 DIAGNOSIS — C7802 Secondary malignant neoplasm of left lung: Secondary | ICD-10-CM | POA: Diagnosis not present

## 2019-02-04 DIAGNOSIS — E785 Hyperlipidemia, unspecified: Secondary | ICD-10-CM | POA: Diagnosis not present

## 2019-02-04 DIAGNOSIS — Z125 Encounter for screening for malignant neoplasm of prostate: Secondary | ICD-10-CM | POA: Diagnosis not present

## 2019-02-04 DIAGNOSIS — Z0000 Encounter for general adult medical examination without abnormal findings: Secondary | ICD-10-CM | POA: Diagnosis not present

## 2019-02-06 ENCOUNTER — Ambulatory Visit: Payer: Medicare Other | Admitting: Radiation Oncology

## 2019-02-20 DIAGNOSIS — E7849 Other hyperlipidemia: Secondary | ICD-10-CM | POA: Diagnosis not present

## 2019-02-20 DIAGNOSIS — C7802 Secondary malignant neoplasm of left lung: Secondary | ICD-10-CM | POA: Diagnosis not present

## 2019-02-20 DIAGNOSIS — Z125 Encounter for screening for malignant neoplasm of prostate: Secondary | ICD-10-CM | POA: Diagnosis not present

## 2019-02-20 DIAGNOSIS — Z0000 Encounter for general adult medical examination without abnormal findings: Secondary | ICD-10-CM | POA: Diagnosis not present

## 2019-02-20 DIAGNOSIS — D490 Neoplasm of unspecified behavior of digestive system: Secondary | ICD-10-CM | POA: Diagnosis not present

## 2019-04-21 DIAGNOSIS — L570 Actinic keratosis: Secondary | ICD-10-CM | POA: Diagnosis not present

## 2019-04-21 DIAGNOSIS — L565 Disseminated superficial actinic porokeratosis (DSAP): Secondary | ICD-10-CM | POA: Diagnosis not present

## 2019-04-21 DIAGNOSIS — D692 Other nonthrombocytopenic purpura: Secondary | ICD-10-CM | POA: Diagnosis not present

## 2019-04-21 DIAGNOSIS — Z85828 Personal history of other malignant neoplasm of skin: Secondary | ICD-10-CM | POA: Diagnosis not present

## 2019-04-25 ENCOUNTER — Encounter: Payer: Self-pay | Admitting: Hematology & Oncology

## 2019-04-25 ENCOUNTER — Ambulatory Visit (HOSPITAL_BASED_OUTPATIENT_CLINIC_OR_DEPARTMENT_OTHER)
Admission: RE | Admit: 2019-04-25 | Discharge: 2019-04-25 | Disposition: A | Discharge status: Home / Self Care | Payer: Medicare Other | Source: Ambulatory Visit | Attending: Hematology & Oncology | Admitting: Hematology & Oncology

## 2019-04-25 ENCOUNTER — Telehealth: Payer: Self-pay | Admitting: Hematology & Oncology

## 2019-04-25 ENCOUNTER — Inpatient Hospital Stay: Payer: Medicare Other

## 2019-04-25 ENCOUNTER — Inpatient Hospital Stay: Payer: Medicare Other | Attending: Hematology & Oncology | Admitting: Hematology & Oncology

## 2019-04-25 ENCOUNTER — Other Ambulatory Visit: Payer: Self-pay

## 2019-04-25 VITALS — BP 148/89 | HR 64 | Temp 97.1°F | Resp 16 | Wt 164.0 lb

## 2019-04-25 DIAGNOSIS — C187 Malignant neoplasm of sigmoid colon: Secondary | ICD-10-CM

## 2019-04-25 DIAGNOSIS — R911 Solitary pulmonary nodule: Secondary | ICD-10-CM | POA: Insufficient documentation

## 2019-04-25 DIAGNOSIS — N200 Calculus of kidney: Secondary | ICD-10-CM | POA: Diagnosis not present

## 2019-04-25 DIAGNOSIS — Z79899 Other long term (current) drug therapy: Secondary | ICD-10-CM | POA: Diagnosis not present

## 2019-04-25 DIAGNOSIS — C189 Malignant neoplasm of colon, unspecified: Secondary | ICD-10-CM | POA: Diagnosis not present

## 2019-04-25 LAB — CBC WITH DIFFERENTIAL (CANCER CENTER ONLY)
Abs Immature Granulocytes: 0.02 10*3/uL (ref 0.00–0.07)
Basophils Absolute: 0 10*3/uL (ref 0.0–0.1)
Basophils Relative: 0 %
Eosinophils Absolute: 0.1 10*3/uL (ref 0.0–0.5)
Eosinophils Relative: 2 %
HCT: 39.4 % (ref 39.0–52.0)
Hemoglobin: 13.3 g/dL (ref 13.0–17.0)
Immature Granulocytes: 0 %
Lymphocytes Relative: 25 %
Lymphs Abs: 1.4 10*3/uL (ref 0.7–4.0)
MCH: 30.9 pg (ref 26.0–34.0)
MCHC: 33.8 g/dL (ref 30.0–36.0)
MCV: 91.4 fL (ref 80.0–100.0)
Monocytes Absolute: 0.7 10*3/uL (ref 0.1–1.0)
Monocytes Relative: 12 %
Neutro Abs: 3.3 10*3/uL (ref 1.7–7.7)
Neutrophils Relative %: 61 %
Platelet Count: 130 10*3/uL — ABNORMAL LOW (ref 150–400)
RBC: 4.31 MIL/uL (ref 4.22–5.81)
RDW: 11.9 % (ref 11.5–15.5)
WBC Count: 5.5 10*3/uL (ref 4.0–10.5)
nRBC: 0 % (ref 0.0–0.2)

## 2019-04-25 LAB — CMP (CANCER CENTER ONLY)
ALT: 20 U/L (ref 0–44)
AST: 27 U/L (ref 15–41)
Albumin: 4.2 g/dL (ref 3.5–5.0)
Alkaline Phosphatase: 61 U/L (ref 38–126)
Anion gap: 4 — ABNORMAL LOW (ref 5–15)
BUN: 20 mg/dL (ref 8–23)
CO2: 30 mmol/L (ref 22–32)
Calcium: 9.4 mg/dL (ref 8.9–10.3)
Chloride: 106 mmol/L (ref 98–111)
Creatinine: 1.28 mg/dL — ABNORMAL HIGH (ref 0.61–1.24)
GFR, Est AFR Am: >60 mL/min (ref >60)
GFR, Estimated: 55 mL/min — ABNORMAL LOW (ref >60)
Glucose, Bld: 100 mg/dL — ABNORMAL HIGH (ref 70–99)
Potassium: 5.2 mmol/L — ABNORMAL HIGH (ref 3.5–5.1)
Sodium: 140 mmol/L (ref 135–145)
Total Bilirubin: 0.7 mg/dL (ref 0.3–1.2)
Total Protein: 6.2 g/dL — ABNORMAL LOW (ref 6.5–8.1)

## 2019-04-25 LAB — CEA (IN HOUSE-CHCC): CEA (CHCC-In House): 1.81 ng/mL (ref 0.00–5.00)

## 2019-04-25 MED ORDER — IOHEXOL 300 MG/ML  SOLN
100.0000 mL | Freq: Once | INTRAMUSCULAR | Status: AC | PRN
  Administered 2019-04-25: 100 mL via INTRAVENOUS

## 2019-04-25 NOTE — Telephone Encounter (Signed)
Spoke with patient to confirm January appts per 9/18 LOS. Pt aware to pick up contrast

## 2019-04-25 NOTE — Progress Notes (Signed)
Hematology and Oncology Follow Up Visit  Jack Gray [id number] 07/14/1946 73 y.o. 04/25/2019   Principle Diagnosis:   Stage IIIB (T3N1cM0) adenocarcinoma of the sigmoid colon -- BRAF (+)  -- metastatic to lung  Current Therapy:    Status post cycle #8 of adjuvant FOLFOX (oxaliplatin d/c'ed w/  cycle #6) - completed on 10/03/2017  Resection of lung met -- solitary -- on 08/08/2018     Interim History:  Jack Gray is back for follow-up.  For right now, everything is doing quite well.  He and his wife spent most of the summer up in the mountains.  [place] the mountains.  They really had a good time.  He has had no complaints.  He has had no cough or shortness of breath.  He has had no problems with pain.  He has been playing golf twice a week.  We did go ahead and get a CT scan of his chest.  This was done today.  The CT scan showed that the nodule in the right upper lung might be some radiation scarring.  Some radiation changes.  The nodule measures 8 x 10 mm.  There is no evidence of any disease anywhere else.  He has had no problems with bowels or bladder.  There is been no leg swelling.  Overall, his performance status is ECOG 0. .   Medications:  Current Outpatient Medications:  .  atorvastatin (LIPITOR) 20 MG tablet, Take 20 mg by mouth every evening. , Disp: , Rfl:  .  b complex vitamins capsule, Take 1 capsule by mouth daily., Disp: , Rfl:  .  Cholecalciferol (VITAMIN D) 2000 units CAPS, Take 2,000 Units by mouth daily., Disp: , Rfl:  .  Collagen Hydrolysate POWD, by Does not apply route daily., Disp: , Rfl:  .  Glucosamine HCl (GLUCOSAMINE PO), Take 1 tablet by mouth 2 (two) times daily., Disp: , Rfl:  .  Multiple Vitamins-Minerals (MULTIVITAMIN PO), Take 1 tablet by mouth daily., Disp: , Rfl:  .  Multiple Vitamins-Minerals (PRESERVISION AREDS PO), Take 1 capsule by mouth 2 (two) times daily. , Disp: , Rfl:   Allergies: No Known Allergies  Past Medical  History, Surgical history, Social history, and Family History were reviewed and updated.  Review of Systems: Review of Systems  Constitutional: Negative for appetite change, fatigue, fever and unexpected weight change.  HENT:   Negative for lump/mass, mouth sores, sore throat and trouble swallowing.   Respiratory: Negative for cough, hemoptysis and shortness of breath.   Cardiovascular: Negative for leg swelling and palpitations.  Gastrointestinal: Negative for abdominal distention, abdominal pain, blood in stool, constipation, diarrhea, nausea and vomiting.  Genitourinary: Negative for bladder incontinence, dysuria, frequency and hematuria.   Musculoskeletal: Negative for arthralgias, back pain, gait problem and myalgias.  Skin: Negative for itching and rash.  Neurological: Negative for dizziness, extremity weakness, gait problem, headaches, numbness, seizures and speech difficulty.  Hematological: Does not bruise/bleed easily.  Psychiatric/Behavioral: Negative for depression and sleep disturbance. The patient is not nervous/anxious.     Physical Exam:  weight is 164 lb (74.4 kg). His temporal temperature is 97.1 F (36.2 C) (abnormal). His blood pressure is 148/89 (abnormal) and his pulse is 64. His respiration is 16 and oxygen saturation is 100%.   Wt Readings from Last 3 Encounters:  04/25/19 164 lb (74.4 kg)  01/29/19 165 lb (74.8 kg)  11/06/18 167 lb 8 oz (76 kg)    Physical Exam Vitals signs   reviewed.  HENT:     Head: Normocephalic and atraumatic.  Eyes:     Pupils: Pupils are equal, round, and reactive to light.  Neck:     Musculoskeletal: Normal range of motion.  Cardiovascular:     Rate and Rhythm: Normal rate and regular rhythm.     Heart sounds: Normal heart sounds.  Pulmonary:     Effort: Pulmonary effort is normal.     Breath sounds: Normal breath sounds.  Abdominal:     General: Bowel sounds are normal.     Palpations: Abdomen is soft.  Musculoskeletal:  Normal range of motion.        General: No tenderness or deformity.  Lymphadenopathy:     Cervical: No cervical adenopathy.  Skin:    General: Skin is warm and dry.     Findings: No erythema or rash.  Neurological:     Mental Status: He is alert and oriented to person, place, and time.  Psychiatric:        Behavior: Behavior normal.        Thought Content: Thought content normal.        Judgment: Judgment normal.      Lab Results  Component Value Date   WBC 5.5 04/25/2019   HGB 13.3 04/25/2019   HCT 39.4 04/25/2019   MCV 91.4 04/25/2019   PLT 130 (L) 04/25/2019     Chemistry      Component Value Date/Time   NA 140 04/25/2019 0757   NA 141 08/08/2017 0802   K 5.2 (H) 04/25/2019 0757   K 3.7 08/08/2017 0802   CL 106 04/25/2019 0757   CL 105 08/08/2017 0802   CO2 30 04/25/2019 0757   CO2 29 08/08/2017 0802   BUN 20 04/25/2019 0757   BUN 15 08/08/2017 0802   CREATININE 1.28 (H) 04/25/2019 0757   CREATININE 1.1 08/08/2017 0802      Component Value Date/Time   CALCIUM 9.4 04/25/2019 0757   CALCIUM 9.2 08/08/2017 0802   ALKPHOS 61 04/25/2019 0757   ALKPHOS 86 (H) 08/08/2017 0802   AST 27 04/25/2019 0757   ALT 20 04/25/2019 0757   ALT 64 (H) 08/08/2017 0802   BILITOT 0.7 04/25/2019 0757       Impression and Plan: Jack Gray is a 73-year-old white male.  He had a stage IIIb adenocarcinoma of the sigmoid colon.  He had this resected.  Even though he had negative lymph nodes, he had 2 nodules which may have been lymph nodes.  He received adjuvant chemotherapy with FOLFOX.  He then had this recurrence which was fairly early after completing the FOLFOX chemotherapy.  Thankfully, he had a solitary recurrence.  He had this resected.  He then had a second recurrence.  This was also in the lung.  He completed SBRT in April.  We will repeat his CT scan when we see him back in January.  I think we can give him 4 months off now.  I think we can get him through the holiday  season.  Of note, I forgot to mention that the last time we saw him, his CEA was 2.01.  I am still pretty hard for his port wife.  She is having her health issues.    Pete Ennever, MD 

## 2019-04-29 DIAGNOSIS — Z23 Encounter for immunization: Secondary | ICD-10-CM | POA: Diagnosis not present

## 2019-05-06 ENCOUNTER — Telehealth: Payer: Self-pay | Admitting: *Deleted

## 2019-05-06 DIAGNOSIS — N23 Unspecified renal colic: Secondary | ICD-10-CM | POA: Diagnosis not present

## 2019-05-06 DIAGNOSIS — R319 Hematuria, unspecified: Secondary | ICD-10-CM | POA: Diagnosis not present

## 2019-05-06 NOTE — Telephone Encounter (Signed)
Returned call to pt regarding pain medication. Reviewed with MD, pt will ned to go to PCP or Urology for further evaluation. [place] reports having pain [place] "[place]p and urology are very far away". Encouraged pt to call both and or go to a walk in clinic in [place] or urgent care.  Pt verbalized understanding, no further concerns.

## 2019-05-07 DIAGNOSIS — N201 Calculus of ureter: Secondary | ICD-10-CM | POA: Diagnosis not present

## 2019-05-19 DIAGNOSIS — H18413 Arcus senilis, bilateral: Secondary | ICD-10-CM | POA: Diagnosis not present

## 2019-05-21 DIAGNOSIS — N201 Calculus of ureter: Secondary | ICD-10-CM | POA: Diagnosis not present

## 2019-05-26 ENCOUNTER — Other Ambulatory Visit: Payer: Self-pay | Admitting: Urology

## 2019-06-02 ENCOUNTER — Other Ambulatory Visit: Payer: Self-pay | Admitting: Urology

## 2019-06-04 NOTE — H&P (Signed)
Office Visit Report     05/21/2019   --------------------------------------------------------------------------------   [name]  MRN: [id number]  DOB: [birth year], 73 year old Male  SSN: [id number]   PRIMARY CARE:  Francis P. Jacelyn Grip, MD  REFERRING:  Edwyna Shell. Jacelyn Grip, MD  PROVIDER:  Kathie Rhodes, M.D.  TREATING:  Jiles Crocker, NP  LOCATION:  [facility] [phone]     --------------------------------------------------------------------------------   CC/HPI: 05/07/2019  Jack Gray is a 73 year old patient of Dr. Karsten Ro. He has a longstanding history of urolithiasis and has undergone intervention with shockwave lithotripsy as well as passed multiple stones. His last stone episode was in 2012. He does have a history of metastatic colon cancer and had just undergone a surveillance imaging study on 04/25/2019. This demonstrated no evidence of recurrent colon cancer fortunately. It did show multiple bilateral nonobstructing kidney stones with the largest measuring approximately 5 mm. He did have 1 stone on the right side that was near his UPJ. He was completely asymptomatic at that time but then subsequently developed severe right-sided flank and lower quadrant abdominal pain approximately 2 days ago. This was associated with nausea and vomiting and was similar to his prior kidney stone episodes. He has not had any fever. He presents today with continued and persistent pain.   05/21/2019: Likely ureteral stone not well visualized on previous KUB imaging study. Patient started on medical expulsive therapy with tamsulosin. He returns today for repeat evaluation. Denies any interval stone material passage.   He continues tamsulosin. After receiving Toradol injection last office visit he is only had a few instances of pain recurrence that quickly resolves with use of pain medication he has on hand. He has tolerated tamsulosin without side effect. Denies any bothersome increasing  urinary frequency, changes in force of stream, burning or painful urination visible blood in the urine. He has been afebrile, no interval n/v.     ALLERGIES: Demerol SOLN    MEDICATIONS: Hydrocodone-Acetaminophen 5 mg-325 mg tablet 1-2 tablet PO Q 6 H prn  Tamsulosin Hcl 0.4 mg capsule 1 capsule PO Q HS  Atorvastatin Calcium  Glucosamine TABS Oral  Multivitamins TABS Oral  Preservision Areds  Vitamin D3     GU PSH: ESWL - 2011, 2011       [place] Notes: Lithotripsy, Inguinal Hernia Repair, Cystoscopy For Removal Of Urethral Stone, Lithotripsy   NON-GU PSH: Hernia Repair Lung Surgery (Unspecified) Shoulder Arthroscopy/surgery, Bilateral     GU PMH: History of urolithiasis, Nephrolithiasis - 2014 Hydronephrosis Unspec, Hydronephrosis On The Right - 2014 Renal calculus, Bilateral kidney stones - 2014 Ureteral calculus, Ureteral Stone - 2014      PMH Notes:  2009-11-19 07:19:28 - Note: Normal Routine History And Physical Adult   NON-GU PMH: Personal history of other diseases of the circulatory system, History of hypertension - 2014 Hypercholesterolemia    FAMILY HISTORY: Cerebral Artery Aneurysm - Father Emphysema - Mother Family Health Status Number - Runs In Family   SOCIAL HISTORY: Marital Status: Married Preferred Language: English; Ethnicity: Not Hispanic Or Latino; Race: White Current Smoking Status: Patient has never smoked.   Tobacco Use Assessment Completed: Used Tobacco in last 30 days? Does not drink anymore.  Drinks 1 caffeinated drink per day.     Notes: Never A Smoker, Drug Use, Caffeine Use, Marital History - Currently Married, Alcohol Use, Occupation:   REVIEW OF SYSTEMS:    GU Review Male:   Patient denies frequent urination, hard to postpone urination, burning/ pain  with urination, get up at night to urinate, leakage of urine, stream starts and stops, trouble starting your stream, have to strain to urinate , erection problems, and penile pain.   Gastrointestinal (Upper):   Patient denies nausea, vomiting, and indigestion/ heartburn.  Gastrointestinal (Lower):   Patient denies diarrhea and constipation.  Constitutional:   Patient denies fever, night sweats, weight loss, and fatigue.  Skin:   Patient denies skin rash/ lesion and itching.  Eyes:   Patient denies blurred vision and double vision.  Ears/ Nose/ Throat:   Patient denies sore throat and sinus problems.  Hematologic/Lymphatic:   Patient denies swollen glands and easy bruising.  Cardiovascular:   Patient denies leg swelling and chest pains.  Respiratory:   Patient denies cough and shortness of breath.  Endocrine:   Patient denies excessive thirst.  Musculoskeletal:   Patient denies back pain and joint pain.  Neurological:   Patient denies headaches and dizziness.  Psychologic:   Patient denies depression and anxiety.   VITAL SIGNS:      05/21/2019 11:22 AM  Weight 160 lb / 72.57 kg  Height 68 in / 172.72 cm  BP 143/90 mmHg  Pulse 70 /min  Temperature 98.2 F / 36.7 C  BMI 24.3 kg/m   MULTI-SYSTEM PHYSICAL EXAMINATION:    Constitutional: Well-nourished. No physical deformities. Normally developed. Good grooming.  Neck: Neck symmetrical, not swollen. Normal tracheal position.  Respiratory: No labored breathing, no use of accessory muscles.   Cardiovascular: Normal temperature, normal extremity pulses, no swelling, no varicosities.  Skin: No paleness, no jaundice, no cyanosis. No lesion, no ulcer, no rash.  Neurologic / Psychiatric: Oriented to time, oriented to place, oriented to person. No depression, no anxiety, no agitation.  Gastrointestinal: No mass, no tenderness, no rigidity, non obese abdomen.  Musculoskeletal: Normal gait and station of head and neck.     PAST DATA REVIEWED:  Source Of History:  Patient, Medical Record Summary  Records Review:   Previous Hospital Records, Previous Patient Records  Urine Test Review:   Urinalysis  X-[place] Review: KUB:  Reviewed Films. Discussed With Patient.  C.T. Abdomen/Pelvis: Reviewed Films. Reviewed Report.     05/21/19 05/21/19  Urinalysis  Urine Appearance Clear  Clear   Urine Color Yellow  Yellow   Urine Glucose Neg mg/dL Neg   Urine Bilirubin Neg mg/dL Neg   Urine Ketones Neg mg/dL Neg   Urine Specific Gravity 1.020  1.020   Urine Blood 3+ ery/uL 3+   Urine pH 5.5  5.5   Urine Protein Neg mg/dL Neg   Urine Urobilinogen 0.2 mg/dL 0.2   Urine Nitrites Neg  Neg   Urine Leukocyte Esterase Neg leu/uL Neg   Urine WBC/hpf 0 - 5/hpf  0 - 5/hpf   Urine RBC/hpf >60/hpf  >60/hpf   Urine Epithelial Cells NS (Not Seen)  NS (Not Seen)   Urine Bacteria Rare (0-9/hpf)  Rare (0-9/hpf)   Urine Mucous Not Present  Not Present   Urine Yeast NS (Not Seen)  NS (Not Seen)   Urine Trichomonas Not Present  Not Present   Urine Cystals NS (Not Seen)  NS (Not Seen)   Urine Casts NS (Not Seen)  NS (Not Seen)   Urine Sperm Not Present  Not Present    PROCEDURES:         KUB - 36644  A single view of the abdomen is obtained. Bilateral non-obstructing calculi visualized. More easily seen on the left compared to the right due  an overlying bowel gas and stool pattern. An approximately 5 mm opacity consistent with ureteral calculi is noted lateral to the right proximal sacral wing just distal to the right SI joint, as it terminates into the pelvic rim. No additional ureteral calculi noted on today's study. Bladder is grossly free of obstruction.       Patient confirmed No Neulasta OnPro Device.            Urinalysis w/Scope - 81001 Dipstick Dipstick Cont'd Micro  Color: Yellow Bilirubin: Neg WBC/hpf: 0 - 5/hpf  Appearance: Clear Ketones: Neg RBC/hpf: >60/hpf  Specific Gravity: 1.020 Blood: 3+ Bacteria: Rare (0-9/hpf)  pH: 5.5 Protein: Neg Cystals: NS (Not Seen)  Glucose: Neg Urobilinogen: 0.2 Casts: NS (Not Seen)    Nitrites: Neg Trichomonas: Not Present    Leukocyte Esterase: Neg Mucous: Not Present       Epithelial Cells: NS (Not Seen)      Yeast: NS (Not Seen)      Sperm: Not Present    Notes:      ASSESSMENT:      ICD-10 Details  1 GU:   Ureteral calculus - N20.1 Right   PLAN:            Medications Refill Meds: Tamsulosin Hcl 0.4 mg capsule 1 capsule PO Q HS   #30  0 Refill(s)    Stop Meds: Hydrocodone-Acetaminophen 5 mg-325 mg tablet 1-2 tablet PO Q 6 H prn  Start: 05/07/2019  Discontinue: 05/21/2019  - Reason: The medication cycle was completed.            Orders Labs Urine Culture          Schedule Return Visit/Planned Activity: Next Available Appointment - Schedule Surgery          Document Letter(s):  Created for Patient: Clinical Summary         Notes:   Jack Gray now visualized on today's exam. He has only been intermittently symptomatic despite efforts at medical expulsive therapy. The location and size of his stone make lithotripsy an ideal treatment and he has elected to proceed with this. I went over its risks and complications. He expressed understanding. He has previously had ESWL in the past and tolerated the procedure without complication.  I recommended he continue tamsulosin, he will alert the office if he passes the stone in the interval. He has pain medication on hand if needed for recurrence of severe renal colic. He will notify us should he develop fever, uncontrolled pain, or persistent nausea/vomiting. I communicated to him appropriate follow-up instructions. A preprocedural urine culture will be sent today. I'll consult with his urologist about plan of care discussed today and did have the patient contacted to schedule shockwave lithotripsy.      * Signed by Jiles Crocker, NP on 05/21/19 at 12:12 PM (EDT)*

## 2019-06-05 ENCOUNTER — Ambulatory Visit (HOSPITAL_COMMUNITY): Payer: Medicare Other

## 2019-06-05 ENCOUNTER — Encounter (HOSPITAL_COMMUNITY)
Admission: RE | Disposition: A | Discharge status: Home / Self Care | Payer: Self-pay | Source: Home / Self Care | Attending: Urology

## 2019-06-05 ENCOUNTER — Ambulatory Visit (HOSPITAL_COMMUNITY)
Admission: RE | Admit: 2019-06-05 | Discharge: 2019-06-05 | Disposition: A | Discharge status: Home / Self Care | Payer: Medicare Other | Attending: Urology | Admitting: Urology

## 2019-06-05 ENCOUNTER — Encounter (HOSPITAL_COMMUNITY): Payer: Self-pay | Admitting: General Practice

## 2019-06-05 DIAGNOSIS — Z79899 Other long term (current) drug therapy: Secondary | ICD-10-CM | POA: Insufficient documentation

## 2019-06-05 DIAGNOSIS — N132 Hydronephrosis with renal and ureteral calculous obstruction: Secondary | ICD-10-CM | POA: Diagnosis not present

## 2019-06-05 DIAGNOSIS — Z87442 Personal history of urinary calculi: Secondary | ICD-10-CM | POA: Insufficient documentation

## 2019-06-05 DIAGNOSIS — Z885 Allergy status to narcotic agent status: Secondary | ICD-10-CM | POA: Insufficient documentation

## 2019-06-05 DIAGNOSIS — N202 Calculus of kidney with calculus of ureter: Secondary | ICD-10-CM | POA: Diagnosis present

## 2019-06-05 DIAGNOSIS — Z85038 Personal history of other malignant neoplasm of large intestine: Secondary | ICD-10-CM | POA: Diagnosis not present

## 2019-06-05 DIAGNOSIS — Z01818 Encounter for other preprocedural examination: Secondary | ICD-10-CM | POA: Diagnosis not present

## 2019-06-05 DIAGNOSIS — N200 Calculus of kidney: Secondary | ICD-10-CM | POA: Diagnosis not present

## 2019-06-05 DIAGNOSIS — N201 Calculus of ureter: Secondary | ICD-10-CM

## 2019-06-05 HISTORY — PX: EXTRACORPOREAL SHOCK WAVE LITHOTRIPSY: SHX1557

## 2019-06-05 SURGERY — LITHOTRIPSY, ESWL
Anesthesia: LOCAL | Laterality: Right

## 2019-06-05 MED ORDER — DIAZEPAM 5 MG PO TABS
10.0000 mg | ORAL_TABLET | ORAL | Status: AC
  Administered 2019-06-05: 10 mg via ORAL
  Filled 2019-06-05: qty 2

## 2019-06-05 MED ORDER — SODIUM CHLORIDE 0.9 % IV SOLN
INTRAVENOUS | Status: DC
  Administered 2019-06-05: 07:00:00 via INTRAVENOUS

## 2019-06-05 MED ORDER — DIPHENHYDRAMINE HCL 25 MG PO CAPS
25.0000 mg | ORAL_CAPSULE | ORAL | Status: AC
  Administered 2019-06-05: 07:00:00 25 mg via ORAL
  Filled 2019-06-05: qty 1

## 2019-06-05 MED ORDER — CIPROFLOXACIN HCL 500 MG PO TABS
500.0000 mg | ORAL_TABLET | ORAL | Status: AC
  Administered 2019-06-05: 500 mg via ORAL
  Filled 2019-06-05: qty 1

## 2019-06-05 NOTE — Discharge Instructions (Addendum)
1. You should strain your urine and collect all fragments and bring them to your follow up appointment.  2. You should take your pain medication as needed.  Please call if your pain is severe to the point that it is not controlled with your pain medication. 3. You should call if you develop fever > 101 or persistent nausea or vomiting. 4. Your doctor may prescribe tamsulosin to take to help facilitate stone passage.  PATIENT INSTRUCTIONS POST-ANESTHESIA  IMMEDIATELY FOLLOWING SURGERY:  Do not drive or operate machinery for the first twenty four hours after surgery.  Do not make any important decisions for twenty four hours after surgery or while taking narcotic pain medications or sedatives.  If you develop intractable nausea and vomiting or a severe headache please notify your doctor immediately.  FOLLOW-UP:  Please make an appointment with your surgeon as instructed. You do not need to follow up with anesthesia unless specifically instructed to do so.  WOUND CARE INSTRUCTIONS (if applicable):  Keep a dry clean dressing on the anesthesia/puncture wound site if there is drainage.  Once the wound has quit draining you may leave it open to air.  Generally you should leave the bandage intact for twenty four hours unless there is drainage.  If the epidural site drains for more than 36-48 hours please call the anesthesia department.  QUESTIONS?:  Please feel free to call your physician or the hospital operator if you have any questions, and they will be happy to assist you.

## 2019-06-05 NOTE — Op Note (Signed)
See [employer] operative note scanned into chart. Also because of the size, density, location and other factors that cannot be anticipated I feel this will likely be a staged procedure. This fact supersedes any indication in the scanned [place] stone operative note to the contrary.

## 2019-06-05 NOTE — Interval H&P Note (Signed)
History and Physical Interval Note:  06/05/2019 7:38 AM  Jack Gray  has presented today for surgery, with the diagnosis of RIGHT URETERAL  CALCULI.  The various methods of treatment have been discussed with the patient and family. After consideration of risks, benefits and other options for treatment, the patient has consented to  Procedure(s): EXTRACORPOREAL SHOCK WAVE LITHOTRIPSY (ESWL) (Right) as a surgical intervention.  The patient's history has been reviewed, patient examined, no change in status, stable for surgery.  I have reviewed the patient's chart and labs.  Questions were answered to the patient's satisfaction.     [employer]

## 2019-06-06 ENCOUNTER — Encounter (HOSPITAL_COMMUNITY): Payer: Self-pay | Admitting: Urology

## 2019-06-19 DIAGNOSIS — N200 Calculus of kidney: Secondary | ICD-10-CM | POA: Diagnosis not present

## 2019-08-15 ENCOUNTER — Inpatient Hospital Stay: Payer: Medicare Other | Attending: Hematology & Oncology | Admitting: Hematology & Oncology

## 2019-08-15 ENCOUNTER — Inpatient Hospital Stay: Payer: Medicare Other

## 2019-08-15 ENCOUNTER — Ambulatory Visit (HOSPITAL_BASED_OUTPATIENT_CLINIC_OR_DEPARTMENT_OTHER)
Admission: RE | Admit: 2019-08-15 | Discharge: 2019-08-15 | Disposition: A | Discharge status: Home / Self Care | Payer: Medicare Other | Source: Ambulatory Visit | Attending: Hematology & Oncology | Admitting: Hematology & Oncology

## 2019-08-15 ENCOUNTER — Encounter (HOSPITAL_BASED_OUTPATIENT_CLINIC_OR_DEPARTMENT_OTHER): Payer: Self-pay

## 2019-08-15 ENCOUNTER — Other Ambulatory Visit: Payer: Self-pay

## 2019-08-15 ENCOUNTER — Encounter: Payer: Self-pay | Admitting: Hematology & Oncology

## 2019-08-15 VITALS — BP 134/89 | HR 81 | Temp 97.5°F | Resp 18 | Wt 168.0 lb

## 2019-08-15 DIAGNOSIS — Z9221 Personal history of antineoplastic chemotherapy: Secondary | ICD-10-CM | POA: Diagnosis not present

## 2019-08-15 DIAGNOSIS — Z79899 Other long term (current) drug therapy: Secondary | ICD-10-CM | POA: Diagnosis not present

## 2019-08-15 DIAGNOSIS — C189 Malignant neoplasm of colon, unspecified: Secondary | ICD-10-CM | POA: Diagnosis not present

## 2019-08-15 DIAGNOSIS — Z85038 Personal history of other malignant neoplasm of large intestine: Secondary | ICD-10-CM | POA: Diagnosis not present

## 2019-08-15 DIAGNOSIS — Z923 Personal history of irradiation: Secondary | ICD-10-CM | POA: Insufficient documentation

## 2019-08-15 DIAGNOSIS — C187 Malignant neoplasm of sigmoid colon: Secondary | ICD-10-CM

## 2019-08-15 DIAGNOSIS — Z85118 Personal history of other malignant neoplasm of bronchus and lung: Secondary | ICD-10-CM | POA: Diagnosis present

## 2019-08-15 LAB — CBC WITH DIFFERENTIAL (CANCER CENTER ONLY)
Abs Immature Granulocytes: 0.03 10*3/uL (ref 0.00–0.07)
Basophils Absolute: 0 10*3/uL (ref 0.0–0.1)
Basophils Relative: 0 %
Eosinophils Absolute: 0.1 10*3/uL (ref 0.0–0.5)
Eosinophils Relative: 2 %
HCT: 40.6 % (ref 39.0–52.0)
Hemoglobin: 13.7 g/dL (ref 13.0–17.0)
Immature Granulocytes: 1 %
Lymphocytes Relative: 27 %
Lymphs Abs: 1.8 10*3/uL (ref 0.7–4.0)
MCH: 30.5 pg (ref 26.0–34.0)
MCHC: 33.7 g/dL (ref 30.0–36.0)
MCV: 90.4 fL (ref 80.0–100.0)
Monocytes Absolute: 0.7 10*3/uL (ref 0.1–1.0)
Monocytes Relative: 10 %
Neutro Abs: 4 10*3/uL (ref 1.7–7.7)
Neutrophils Relative %: 60 %
Platelet Count: 140 10*3/uL — ABNORMAL LOW (ref 150–400)
RBC: 4.49 MIL/uL (ref 4.22–5.81)
RDW: 12.2 % (ref 11.5–15.5)
WBC Count: 6.6 10*3/uL (ref 4.0–10.5)
nRBC: 0 % (ref 0.0–0.2)

## 2019-08-15 LAB — CMP (CANCER CENTER ONLY)
ALT: 20 U/L (ref 0–44)
AST: 26 U/L (ref 15–41)
Albumin: 4.7 g/dL (ref 3.5–5.0)
Alkaline Phosphatase: 63 U/L (ref 38–126)
Anion gap: 4 — ABNORMAL LOW (ref 5–15)
BUN: 19 mg/dL (ref 8–23)
CO2: 33 mmol/L — ABNORMAL HIGH (ref 22–32)
Calcium: 10.1 mg/dL (ref 8.9–10.3)
Chloride: 102 mmol/L (ref 98–111)
Creatinine: 1.3 mg/dL — ABNORMAL HIGH (ref 0.61–1.24)
GFR, Est AFR Am: >60 mL/min (ref >60)
GFR, Estimated: 54 mL/min — ABNORMAL LOW (ref >60)
Glucose, Bld: 111 mg/dL — ABNORMAL HIGH (ref 70–99)
Potassium: 4.7 mmol/L (ref 3.5–5.1)
Sodium: 139 mmol/L (ref 135–145)
Total Bilirubin: 0.9 mg/dL (ref 0.3–1.2)
Total Protein: 6.9 g/dL (ref 6.5–8.1)

## 2019-08-15 LAB — CEA (IN HOUSE-CHCC): CEA (CHCC-In House): 1.1 ng/mL (ref 0.00–5.00)

## 2019-08-15 MED ORDER — IOHEXOL 300 MG/ML  SOLN
100.0000 mL | Freq: Once | INTRAMUSCULAR | Status: AC | PRN
  Administered 2019-08-15: 100 mL via INTRAVENOUS

## 2019-08-15 NOTE — Progress Notes (Signed)
Hematology and Oncology Follow Up Visit  RAYLON LAMSON [id number] 07-Jun-1946 74 y.o. 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 Fodor is back for follow-up.  So far, everything is going quite well for he and his family.  The big news is that his daughter got married in November.  She actually was the wife of one of our patients.  He passed away quite young with metastatic colon cancer.  I am so happy that she found somebody else who she can make happy and take care of.  He is feeling okay.  He had a CT scan done today.  The CT scan did not show any evidence of recurrent colon cancer.  His last CEA level back in September 2020 was 1.81.  He denies any abdominal pain.  There is no cough.  He has had no fever.  He has had no rashes.  He has had no leg swelling.  He has had no weight loss or weight gain.  His appetite is quite good.  Overall, his performance status is ECOG 0.  .   Medications:  Current Outpatient Medications:  .  atorvastatin (LIPITOR) 20 MG tablet, Take 20 mg by mouth every evening. , Disp: , Rfl:  .  b complex vitamins capsule, Take 1 capsule by mouth daily., Disp: , Rfl:  .  Cholecalciferol (VITAMIN D) 2000 units CAPS, Take 2,000 Units by mouth daily., Disp: , Rfl:  .  Glucosamine HCl (GLUCOSAMINE PO), Take 1 tablet by mouth 2 (two) times daily., Disp: , Rfl:  .  Multiple Vitamins-Minerals (PRESERVISION AREDS PO), Take 1 capsule by mouth 2 (two) times daily. , Disp: , Rfl:   Allergies: No Known Allergies  Past Medical History, Surgical history, Social history, and Family History were reviewed and updated.  Review of Systems: Review of Systems  Constitutional: Negative for appetite  change, fatigue, fever and unexpected weight change.  HENT:   Negative for lump/mass, mouth sores, sore throat and trouble swallowing.   Respiratory: Negative for cough, hemoptysis and shortness of breath.   Cardiovascular: Negative for leg swelling and palpitations.  Gastrointestinal: Negative for abdominal distention, abdominal pain, blood in stool, constipation, diarrhea, nausea and vomiting.  Genitourinary: Negative for bladder incontinence, dysuria, frequency and hematuria.   Musculoskeletal: Negative for arthralgias, back pain, gait problem and myalgias.  Skin: Negative for itching and rash.  Neurological: Negative for dizziness, extremity weakness, gait problem, headaches, numbness, seizures and speech difficulty.  Hematological: Does not bruise/bleed easily.  Psychiatric/Behavioral: Negative for depression and sleep disturbance. The patient is not nervous/anxious.     Physical Exam:  weight is 168 lb (76.2 kg). His temporal temperature is 97.5 F (36.4 C) (abnormal). His blood pressure is 134/89 and his pulse is 81. His respiration is 18 and oxygen saturation is 98%.   Wt Readings from Last 3 Encounters:  08/15/19 168 lb (76.2 kg)  06/05/19 156 lb 12.8 oz (71.1 kg)  04/25/19 164 lb (74.4 kg)    Physical Exam Vitals reviewed.  HENT:     Head: Normocephalic and atraumatic.  Eyes:     Pupils: Pupils are equal, round, and reactive to light.  Cardiovascular:  Rate and Rhythm: Normal rate and regular rhythm.     Heart sounds: Normal heart sounds.  Pulmonary:     Effort: Pulmonary effort is normal.     Breath sounds: Normal breath sounds.  Abdominal:     General: Bowel sounds are normal.     Palpations: Abdomen is soft.  Musculoskeletal:        General: No tenderness or deformity. Normal range of motion.     Cervical back: Normal range of motion.  Lymphadenopathy:     Cervical: No cervical adenopathy.  Skin:    General: Skin is warm and dry.     Findings: No erythema  or rash.  Neurological:     Mental Status: He is alert and oriented to person, place, and time.  Psychiatric:        Behavior: Behavior normal.        Thought Content: Thought content normal.        Judgment: Judgment normal.      Lab Results  Component Value Date   WBC 6.6 08/15/2019   HGB 13.7 08/15/2019   HCT 40.6 08/15/2019   MCV 90.4 08/15/2019   PLT 140 (L) 08/15/2019     Chemistry      Component Value Date/Time   NA 139 08/15/2019 0823   NA 141 08/08/2017 0802   K 4.7 08/15/2019 0823   K 3.7 08/08/2017 0802   CL 102 08/15/2019 0823   CL 105 08/08/2017 0802   CO2 33 (H) 08/15/2019 0823   CO2 29 08/08/2017 0802   BUN 19 08/15/2019 0823   BUN 15 08/08/2017 0802   CREATININE 1.30 (H) 08/15/2019 0823   CREATININE 1.1 08/08/2017 0802      Component Value Date/Time   CALCIUM 10.1 08/15/2019 0823   CALCIUM 9.2 08/08/2017 0802   ALKPHOS 63 08/15/2019 0823   ALKPHOS 86 (H) 08/08/2017 0802   AST 26 08/15/2019 0823   ALT 20 08/15/2019 0823   ALT 64 (H) 08/08/2017 0802   BILITOT 0.9 08/15/2019 0823       Impression and Plan: Mr. Divita is a 74 year old white male.  He had a stage IIIb adenocarcinoma of the sigmoid colon.  He had this resected.  Even though he had negative lymph nodes, he had 2 nodules which may have been lymph nodes.  He received adjuvant chemotherapy with FOLFOX.  He then had this recurrence which was fairly early after completing the FOLFOX chemotherapy.  Thankfully, he had a solitary recurrence.  He had this resected.  He then had a second recurrence.  This was also in the lung.  He completed SBRT in April.  We still need to follow along with scans.  He obviously is at risk for another recurrence.  I think we can do his scan in May 2021.  I think this would be reasonable to do.  Again I am just very excited that his daughter got married.  She is incredibly nice.  She is very talented.  She has an incredible work Psychologist, forensic.  I know that she will make  her new husband very happy and he also making her happy.  Lattie Haw, MD

## 2019-08-27 DIAGNOSIS — Z1159 Encounter for screening for other viral diseases: Secondary | ICD-10-CM | POA: Diagnosis not present

## 2019-09-11 DIAGNOSIS — Z20828 Contact with and (suspected) exposure to other viral communicable diseases: Secondary | ICD-10-CM | POA: Diagnosis not present

## 2019-10-01 DIAGNOSIS — Z23 Encounter for immunization: Secondary | ICD-10-CM | POA: Diagnosis not present

## 2019-10-29 DIAGNOSIS — Z23 Encounter for immunization: Secondary | ICD-10-CM | POA: Diagnosis not present

## 2019-11-06 DIAGNOSIS — Z1159 Encounter for screening for other viral diseases: Secondary | ICD-10-CM | POA: Diagnosis not present

## 2019-11-11 DIAGNOSIS — D122 Benign neoplasm of ascending colon: Secondary | ICD-10-CM | POA: Diagnosis not present

## 2019-11-11 DIAGNOSIS — K5730 Diverticulosis of large intestine without perforation or abscess without bleeding: Secondary | ICD-10-CM | POA: Diagnosis not present

## 2019-11-11 DIAGNOSIS — K648 Other hemorrhoids: Secondary | ICD-10-CM | POA: Diagnosis not present

## 2019-11-11 DIAGNOSIS — D120 Benign neoplasm of cecum: Secondary | ICD-10-CM | POA: Diagnosis not present

## 2019-11-11 DIAGNOSIS — Z85038 Personal history of other malignant neoplasm of large intestine: Secondary | ICD-10-CM | POA: Diagnosis not present

## 2019-11-11 DIAGNOSIS — D124 Benign neoplasm of descending colon: Secondary | ICD-10-CM | POA: Diagnosis not present

## 2019-11-14 DIAGNOSIS — D124 Benign neoplasm of descending colon: Secondary | ICD-10-CM | POA: Diagnosis not present

## 2019-11-14 DIAGNOSIS — D122 Benign neoplasm of ascending colon: Secondary | ICD-10-CM | POA: Diagnosis not present

## 2019-11-14 DIAGNOSIS — D120 Benign neoplasm of cecum: Secondary | ICD-10-CM | POA: Diagnosis not present

## 2019-12-10 ENCOUNTER — Inpatient Hospital Stay: Payer: Medicare Other

## 2019-12-10 ENCOUNTER — Encounter: Payer: Self-pay | Admitting: Hematology & Oncology

## 2019-12-10 ENCOUNTER — Telehealth: Payer: Self-pay | Admitting: Hematology & Oncology

## 2019-12-10 ENCOUNTER — Ambulatory Visit (HOSPITAL_BASED_OUTPATIENT_CLINIC_OR_DEPARTMENT_OTHER)
Admission: RE | Admit: 2019-12-10 | Discharge: 2019-12-10 | Disposition: A | Discharge status: Home / Self Care | Payer: Medicare Other | Source: Ambulatory Visit | Attending: Hematology & Oncology | Admitting: Hematology & Oncology

## 2019-12-10 ENCOUNTER — Other Ambulatory Visit: Payer: Self-pay

## 2019-12-10 ENCOUNTER — Inpatient Hospital Stay: Payer: Medicare Other | Attending: Hematology & Oncology | Admitting: Hematology & Oncology

## 2019-12-10 ENCOUNTER — Encounter (HOSPITAL_BASED_OUTPATIENT_CLINIC_OR_DEPARTMENT_OTHER): Payer: Self-pay

## 2019-12-10 VITALS — BP 134/89 | HR 76 | Temp 97.1°F | Resp 20 | Wt 163.1 lb

## 2019-12-10 DIAGNOSIS — C187 Malignant neoplasm of sigmoid colon: Secondary | ICD-10-CM

## 2019-12-10 DIAGNOSIS — Z86010 Personal history of colonic polyps: Secondary | ICD-10-CM | POA: Insufficient documentation

## 2019-12-10 DIAGNOSIS — Z79899 Other long term (current) drug therapy: Secondary | ICD-10-CM | POA: Diagnosis not present

## 2019-12-10 DIAGNOSIS — Z9221 Personal history of antineoplastic chemotherapy: Secondary | ICD-10-CM | POA: Diagnosis not present

## 2019-12-10 DIAGNOSIS — C189 Malignant neoplasm of colon, unspecified: Secondary | ICD-10-CM | POA: Diagnosis not present

## 2019-12-10 DIAGNOSIS — Z923 Personal history of irradiation: Secondary | ICD-10-CM | POA: Diagnosis not present

## 2019-12-10 DIAGNOSIS — C7800 Secondary malignant neoplasm of unspecified lung: Secondary | ICD-10-CM | POA: Diagnosis not present

## 2019-12-10 LAB — CBC WITH DIFFERENTIAL (CANCER CENTER ONLY)
Abs Immature Granulocytes: 0.02 10*3/uL (ref 0.00–0.07)
Basophils Absolute: 0 10*3/uL (ref 0.0–0.1)
Basophils Relative: 1 %
Eosinophils Absolute: 0.1 10*3/uL (ref 0.0–0.5)
Eosinophils Relative: 2 %
HCT: 37.9 % — ABNORMAL LOW (ref 39.0–52.0)
Hemoglobin: 13.2 g/dL (ref 13.0–17.0)
Immature Granulocytes: 0 %
Lymphocytes Relative: 27 %
Lymphs Abs: 1.7 10*3/uL (ref 0.7–4.0)
MCH: 31.3 pg (ref 26.0–34.0)
MCHC: 34.8 g/dL (ref 30.0–36.0)
MCV: 89.8 fL (ref 80.0–100.0)
Monocytes Absolute: 0.7 10*3/uL (ref 0.1–1.0)
Monocytes Relative: 11 %
Neutro Abs: 3.7 10*3/uL (ref 1.7–7.7)
Neutrophils Relative %: 59 %
Platelet Count: 155 10*3/uL (ref 150–400)
RBC: 4.22 MIL/uL (ref 4.22–5.81)
RDW: 12 % (ref 11.5–15.5)
WBC Count: 6.3 10*3/uL (ref 4.0–10.5)
nRBC: 0 % (ref 0.0–0.2)

## 2019-12-10 LAB — CMP (CANCER CENTER ONLY)
ALT: 21 U/L (ref 0–44)
AST: 27 U/L (ref 15–41)
Albumin: 4.5 g/dL (ref 3.5–5.0)
Alkaline Phosphatase: 64 U/L (ref 38–126)
Anion gap: 6 (ref 5–15)
BUN: 24 mg/dL — ABNORMAL HIGH (ref 8–23)
CO2: 32 mmol/L (ref 22–32)
Calcium: 9.9 mg/dL (ref 8.9–10.3)
Chloride: 104 mmol/L (ref 98–111)
Creatinine: 1.33 mg/dL — ABNORMAL HIGH (ref 0.61–1.24)
GFR, Est AFR Am: >60 mL/min (ref >60)
GFR, Estimated: 53 mL/min — ABNORMAL LOW (ref >60)
Glucose, Bld: 111 mg/dL — ABNORMAL HIGH (ref 70–99)
Potassium: 4.9 mmol/L (ref 3.5–5.1)
Sodium: 142 mmol/L (ref 135–145)
Total Bilirubin: 0.9 mg/dL (ref 0.3–1.2)
Total Protein: 6.6 g/dL (ref 6.5–8.1)

## 2019-12-10 LAB — CEA (IN HOUSE-CHCC): CEA (CHCC-In House): 1.42 ng/mL (ref 0.00–5.00)

## 2019-12-10 MED ORDER — IOHEXOL 300 MG/ML  SOLN
100.0000 mL | Freq: Once | INTRAMUSCULAR | Status: AC | PRN
  Administered 2019-12-10: 100 mL via INTRAVENOUS

## 2019-12-10 NOTE — Telephone Encounter (Signed)
Appointments scheduled calendar printed per 5/5 los

## 2019-12-10 NOTE — Progress Notes (Signed)
Hematology and Oncology Follow Up Visit  Jack Gray [id number] [birth year] 74 y.o. 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  So far, everything is going quite well for he and his family.  Unfortunately, he did not go to [place] this winter.  Hopefully, he and his wife will go next year.  He did have his CAT scan done today.  The CAT scan did not show any evidence of new or progressive disease.  His last CEA back in January was 1.1.  He has had no problems with cough or shortness of breath.  He has had no nausea or vomiting.  He has had no change in bowel or bladder habits.  There is been no fever.  He did have a colonoscopy back in April.  Apparently this did show some polyps.  I really think that he needs to have a colonoscopy in 1 year.  I think if that colonoscopy looks good, then maybe he will go 3 years.  Overall, his performance status is ECOG 0.  .   Medications:  Current Outpatient Medications:  .  atorvastatin (LIPITOR) 20 MG tablet, Take 20 mg by mouth every evening. , Disp: , Rfl:  .  b complex vitamins capsule, Take 1 capsule by mouth daily., Disp: , Rfl:  .  Cholecalciferol (VITAMIN D) 2000 units CAPS, Take 2,000 Units by mouth daily., Disp: , Rfl:  .  Glucosamine HCl (GLUCOSAMINE PO), Take 1 tablet by mouth 2 (two) times daily., Disp: , Rfl:  .  Multiple Vitamins-Minerals (PRESERVISION AREDS PO), Take 1 capsule by mouth 2 (two) times daily. , Disp: , Rfl:   Allergies: No Known Allergies  Past Medical History, Surgical history, Social history, and Family History were reviewed and updated.  Review of Systems: Review of Systems  Constitutional: Negative  for appetite change, fatigue, fever and unexpected weight change.  HENT:   Negative for lump/mass, mouth sores, sore throat and trouble swallowing.   Respiratory: Negative for cough, hemoptysis and shortness of breath.   Cardiovascular: Negative for leg swelling and palpitations.  Gastrointestinal: Negative for abdominal distention, abdominal pain, blood in stool, constipation, diarrhea, nausea and vomiting.  Genitourinary: Negative for bladder incontinence, dysuria, frequency and hematuria.   Musculoskeletal: Negative for arthralgias, back pain, gait problem and myalgias.  Skin: Negative for itching and rash.  Neurological: Negative for dizziness, extremity weakness, gait problem, headaches, numbness, seizures and speech difficulty.  Hematological: Does not bruise/bleed easily.  Psychiatric/Behavioral: Negative for depression and sleep disturbance. The patient is not nervous/anxious.     Physical Exam:  weight is 163 lb 1.9 oz (74 kg). His temporal temperature is 97.1 F (36.2 C) (abnormal). His blood pressure is 134/89 and his pulse is 76. His respiration is 20 and oxygen saturation is 99%.   Wt Readings from Last 3 Encounters:  12/10/19 163 lb 1.9 oz (74 kg)  08/15/19 168 lb (76.2 kg)  06/05/19 156 lb 12.8 oz (71.1 kg)    Physical Exam Vitals reviewed.  HENT:     Head: Normocephalic and atraumatic.  Eyes:     Pupils: Pupils are equal, round, and reactive to light.  Cardiovascular:     Rate and Rhythm: Normal rate and regular rhythm.     Heart sounds: Normal heart sounds.  Pulmonary:     Effort: Pulmonary effort is normal.     Breath sounds: Normal breath sounds.  Abdominal:     General: Bowel sounds are normal.     Palpations: Abdomen is soft.  Musculoskeletal:        General: No tenderness or deformity. Normal range of motion.     Cervical back: Normal range of motion.  Lymphadenopathy:     Cervical: No cervical adenopathy.  Skin:    General: Skin is warm and dry.      Findings: No erythema or rash.  Neurological:     Mental Status: He is alert and oriented to person, place, and time.  Psychiatric:        Behavior: Behavior normal.        Thought Content: Thought content normal.        Judgment: Judgment normal.      Lab Results  Component Value Date   WBC 6.3 12/10/2019   HGB 13.2 12/10/2019   HCT 37.9 (L) 12/10/2019   MCV 89.8 12/10/2019   PLT 155 12/10/2019     Chemistry      Component Value Date/Time   NA 142 12/10/2019 0747   NA 141 08/08/2017 0802   K 4.9 12/10/2019 0747   K 3.7 08/08/2017 0802   CL 104 12/10/2019 0747   CL 105 08/08/2017 0802   CO2 32 12/10/2019 0747   CO2 29 08/08/2017 0802   BUN 24 (H) 12/10/2019 0747   BUN 15 08/08/2017 0802   CREATININE 1.33 (H) 12/10/2019 0747   CREATININE 1.1 08/08/2017 0802      Component Value Date/Time   CALCIUM 9.9 12/10/2019 0747   CALCIUM 9.2 08/08/2017 0802   ALKPHOS 64 12/10/2019 0747   ALKPHOS 86 (H) 08/08/2017 0802   AST 27 12/10/2019 0747   ALT 21 12/10/2019 0747   ALT 64 (H) 08/08/2017 0802   BILITOT 0.9 12/10/2019 0747       Impression and Plan: Jack Gray is a 74 year old white male.  He had a stage IIIb adenocarcinoma of the sigmoid colon.  He had this resected.  Even though he had negative lymph nodes, he had 2 nodules which may have been lymph nodes.  He received adjuvant chemotherapy with FOLFOX.  He then had this recurrence which was fairly early after completing the FOLFOX chemotherapy.  Thankfully, he had a solitary recurrence.  He had this resected.  He then had a second recurrence.  This was also in the lung.  He completed SBRT in April, 2020.  We still need to follow along with scans.  He obviously is at risk for another recurrence.  I think we can do his scan in 4 months.   I am just happy that he is doing well.  Is all his wife doing pretty well.  I know she has had her health issues.  We will plan to get her back in 4 months.  We will do our CT scan  the same day that we see him.    Lattie Haw, MD

## 2019-12-17 DIAGNOSIS — L570 Actinic keratosis: Secondary | ICD-10-CM | POA: Diagnosis not present

## 2019-12-17 DIAGNOSIS — D2272 Melanocytic nevi of left lower limb, including hip: Secondary | ICD-10-CM | POA: Diagnosis not present

## 2019-12-17 DIAGNOSIS — L821 Other seborrheic keratosis: Secondary | ICD-10-CM | POA: Diagnosis not present

## 2019-12-17 DIAGNOSIS — D2271 Melanocytic nevi of right lower limb, including hip: Secondary | ICD-10-CM | POA: Diagnosis not present

## 2020-03-26 DIAGNOSIS — Z20828 Contact with and (suspected) exposure to other viral communicable diseases: Secondary | ICD-10-CM | POA: Diagnosis not present

## 2020-04-14 ENCOUNTER — Ambulatory Visit (HOSPITAL_BASED_OUTPATIENT_CLINIC_OR_DEPARTMENT_OTHER)
Admission: RE | Admit: 2020-04-14 | Discharge: 2020-04-14 | Disposition: A | Discharge status: Home / Self Care | Payer: Medicare Other | Source: Ambulatory Visit | Attending: Hematology & Oncology | Admitting: Hematology & Oncology

## 2020-04-14 ENCOUNTER — Telehealth: Payer: Self-pay | Admitting: Hematology & Oncology

## 2020-04-14 ENCOUNTER — Encounter: Payer: Self-pay | Admitting: Hematology & Oncology

## 2020-04-14 ENCOUNTER — Other Ambulatory Visit: Payer: Self-pay

## 2020-04-14 ENCOUNTER — Inpatient Hospital Stay: Payer: Medicare Other | Attending: Hematology & Oncology

## 2020-04-14 ENCOUNTER — Inpatient Hospital Stay (HOSPITAL_BASED_OUTPATIENT_CLINIC_OR_DEPARTMENT_OTHER): Payer: Medicare Other | Admitting: Hematology & Oncology

## 2020-04-14 VITALS — BP 143/84 | HR 80 | Temp 97.7°F | Resp 18 | Wt 162.8 lb

## 2020-04-14 DIAGNOSIS — C187 Malignant neoplasm of sigmoid colon: Secondary | ICD-10-CM

## 2020-04-14 DIAGNOSIS — Z79899 Other long term (current) drug therapy: Secondary | ICD-10-CM | POA: Diagnosis not present

## 2020-04-14 DIAGNOSIS — J984 Other disorders of lung: Secondary | ICD-10-CM | POA: Diagnosis not present

## 2020-04-14 DIAGNOSIS — Z85038 Personal history of other malignant neoplasm of large intestine: Secondary | ICD-10-CM | POA: Diagnosis not present

## 2020-04-14 DIAGNOSIS — Z9221 Personal history of antineoplastic chemotherapy: Secondary | ICD-10-CM | POA: Insufficient documentation

## 2020-04-14 DIAGNOSIS — N200 Calculus of kidney: Secondary | ICD-10-CM | POA: Diagnosis not present

## 2020-04-14 DIAGNOSIS — C189 Malignant neoplasm of colon, unspecified: Secondary | ICD-10-CM | POA: Diagnosis not present

## 2020-04-14 LAB — CMP (CANCER CENTER ONLY)
ALT: 23 U/L (ref 0–44)
AST: 28 U/L (ref 15–41)
Albumin: 4.5 g/dL (ref 3.5–5.0)
Alkaline Phosphatase: 64 U/L (ref 38–126)
Anion gap: 6 (ref 5–15)
BUN: 25 mg/dL — ABNORMAL HIGH (ref 8–23)
CO2: 32 mmol/L (ref 22–32)
Calcium: 10.2 mg/dL (ref 8.9–10.3)
Chloride: 102 mmol/L (ref 98–111)
Creatinine: 1.37 mg/dL — ABNORMAL HIGH (ref 0.61–1.24)
GFR, Est AFR Am: 58 mL/min — ABNORMAL LOW (ref >60)
GFR, Estimated: 50 mL/min — ABNORMAL LOW (ref >60)
Glucose, Bld: 104 mg/dL — ABNORMAL HIGH (ref 70–99)
Potassium: 4.6 mmol/L (ref 3.5–5.1)
Sodium: 140 mmol/L (ref 135–145)
Total Bilirubin: 1 mg/dL (ref 0.3–1.2)
Total Protein: 6.8 g/dL (ref 6.5–8.1)

## 2020-04-14 LAB — CBC WITH DIFFERENTIAL (CANCER CENTER ONLY)
Abs Immature Granulocytes: 0.02 10*3/uL (ref 0.00–0.07)
Basophils Absolute: 0 10*3/uL (ref 0.0–0.1)
Basophils Relative: 1 %
Eosinophils Absolute: 0.1 10*3/uL (ref 0.0–0.5)
Eosinophils Relative: 2 %
HCT: 39 % (ref 39.0–52.0)
Hemoglobin: 13.4 g/dL (ref 13.0–17.0)
Immature Granulocytes: 0 %
Lymphocytes Relative: 25 %
Lymphs Abs: 1.6 10*3/uL (ref 0.7–4.0)
MCH: 31.1 pg (ref 26.0–34.0)
MCHC: 34.4 g/dL (ref 30.0–36.0)
MCV: 90.5 fL (ref 80.0–100.0)
Monocytes Absolute: 0.6 10*3/uL (ref 0.1–1.0)
Monocytes Relative: 10 %
Neutro Abs: 3.9 10*3/uL (ref 1.7–7.7)
Neutrophils Relative %: 62 %
Platelet Count: 129 10*3/uL — ABNORMAL LOW (ref 150–400)
RBC: 4.31 MIL/uL (ref 4.22–5.81)
RDW: 11.9 % (ref 11.5–15.5)
WBC Count: 6.3 10*3/uL (ref 4.0–10.5)
nRBC: 0 % (ref 0.0–0.2)

## 2020-04-14 LAB — CEA (IN HOUSE-CHCC): CEA (CHCC-In House): 1.74 ng/mL (ref 0.00–5.00)

## 2020-04-14 MED ORDER — IOHEXOL 300 MG/ML  SOLN
100.0000 mL | Freq: Once | INTRAMUSCULAR | Status: AC | PRN
  Administered 2020-04-14: 100 mL via INTRAVENOUS

## 2020-04-14 NOTE — Telephone Encounter (Signed)
Appointments scheduled patient will stop by Imaging department to schedule CT's for 10/2020 and pick up contrast as well per 9/8 los

## 2020-04-14 NOTE — Progress Notes (Signed)
Hematology and Oncology Follow Up Visit  Jack Gray [id number] Oct 05, 1945 74 y.o. 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  So far, everything is going quite well for he and his family.  He found out that he is now going to be a great grandfather in March.  This is really good news.  We did have the CAT scan done today.  Thankfully, the CAT scan did not show any changes.  The right middle lobe nodule is stable at 11 x 11 mm.  He has had no problems with bowels or bladder.  He has had no problems with bleeding.  There is been no cough or shortness of breath.  He has had a coronavirus vaccines.  His last CEA level back in May was 1.42.  He really would like to have a colonoscopy in April.  He just feels like he needs a yearly colonoscopy because of history of polyps.  I can understand this.  We will have to see if his gastroenterologist will do this.  We will also have to see if insurance will cover this.  Overall, his performance status is ECOG 0.   Medications:  Current Outpatient Medications:  .  atorvastatin (LIPITOR) 20 MG tablet, Take 20 mg by mouth every evening. , Disp: , Rfl:  .  b complex vitamins capsule, Take 1 capsule by mouth daily., Disp: , Rfl:  .  Cholecalciferol (VITAMIN D) 2000 units CAPS, Take 2,000 Units by mouth daily., Disp: , Rfl:  .  Glucosamine HCl (GLUCOSAMINE PO), Take 1 tablet by mouth 2 (two) times daily., Disp: , Rfl:  .  Multiple Vitamins-Minerals (PRESERVISION AREDS PO), Take 1 capsule by mouth 2 (two) times daily. , Disp: , Rfl:   Allergies: No Known Allergies  Past Medical History, Surgical history, Social history, and Family History  were reviewed and updated.  Review of Systems: Review of Systems  Constitutional: Negative for appetite change, fatigue, fever and unexpected weight change.  HENT:   Negative for lump/mass, mouth sores, sore throat and trouble swallowing.   Respiratory: Negative for cough, hemoptysis and shortness of breath.   Cardiovascular: Negative for leg swelling and palpitations.  Gastrointestinal: Negative for abdominal distention, abdominal pain, blood in stool, constipation, diarrhea, nausea and vomiting.  Genitourinary: Negative for bladder incontinence, dysuria, frequency and hematuria.   Musculoskeletal: Negative for arthralgias, back pain, gait problem and myalgias.  Skin: Negative for itching and rash.  Neurological: Negative for dizziness, extremity weakness, gait problem, headaches, numbness, seizures and speech difficulty.  Hematological: Does not bruise/bleed easily.  Psychiatric/Behavioral: Negative for depression and sleep disturbance. The patient is not nervous/anxious.     Physical Exam:  weight is 162 lb 12 oz (73.8 kg). His oral temperature is 97.7 F (36.5 C). His blood pressure is 143/84 (abnormal) and his pulse is 80. His respiration is 18 and oxygen saturation is 100%.   Wt Readings from Last 3 Encounters:  04/14/20 162 lb 12 oz (73.8 kg)  12/10/19 163 lb 1.9 oz (74 kg)  08/15/19 168 lb (76.2 kg)    Physical Exam Vitals reviewed.  HENT:     Head: Normocephalic  and atraumatic.  Eyes:     Pupils: Pupils are equal, round, and reactive to light.  Cardiovascular:     Rate and Rhythm: Normal rate and regular rhythm.     Heart sounds: Normal heart sounds.  Pulmonary:     Effort: Pulmonary effort is normal.     Breath sounds: Normal breath sounds.  Abdominal:     General: Bowel sounds are normal.     Palpations: Abdomen is soft.  Musculoskeletal:        General: No tenderness or deformity. Normal range of motion.     Cervical back: Normal range of motion.   Lymphadenopathy:     Cervical: No cervical adenopathy.  Skin:    General: Skin is warm and dry.     Findings: No erythema or rash.  Neurological:     Mental Status: He is alert and oriented to person, place, and time.  Psychiatric:        Behavior: Behavior normal.        Thought Content: Thought content normal.        Judgment: Judgment normal.      Lab Results  Component Value Date   WBC 6.3 04/14/2020   HGB 13.4 04/14/2020   HCT 39.0 04/14/2020   MCV 90.5 04/14/2020   PLT 129 (L) 04/14/2020     Chemistry      Component Value Date/Time   NA 140 04/14/2020 0744   NA 141 08/08/2017 0802   K 4.6 04/14/2020 0744   K 3.7 08/08/2017 0802   CL 102 04/14/2020 0744   CL 105 08/08/2017 0802   CO2 32 04/14/2020 0744   CO2 29 08/08/2017 0802   BUN 25 (H) 04/14/2020 0744   BUN 15 08/08/2017 0802   CREATININE 1.37 (H) 04/14/2020 0744   CREATININE 1.1 08/08/2017 0802      Component Value Date/Time   CALCIUM 10.2 04/14/2020 0744   CALCIUM 9.2 08/08/2017 0802   ALKPHOS 64 04/14/2020 0744   ALKPHOS 86 (H) 08/08/2017 0802   AST 28 04/14/2020 0744   ALT 23 04/14/2020 0744   ALT 64 (H) 08/08/2017 0802   BILITOT 1.0 04/14/2020 0744       Impression and Plan: Jack Gray is a 74 year old white male.  He had a stage IIIb adenocarcinoma of the sigmoid colon.  He had this resected.  Even though he had negative lymph nodes, he had 2 nodules which may have been lymph nodes.  He received adjuvant chemotherapy with FOLFOX.  He then had this recurrence which was fairly early after completing the FOLFOX chemotherapy.  Thankfully, he had a solitary recurrence.  He had this resected.  He then had a second recurrence.  This was also in the lung.  He completed SBRT in April, 2020.  We still need to follow along with scans.  He obviously is at risk for another recurrence.  However, I think we now go 6 months with our next scan.  Again, hopefully, he will be able to get a colonoscopy next  year.  I am very happy for him and his wife about having a great grandchild.  I know their daughter is very excited about being a grandmother.  I know that she will really love that child and tell him/her about his/her grandfather who passed away a few years ago.    Jack Gray Haw, MD

## 2020-05-11 DIAGNOSIS — Z23 Encounter for immunization: Secondary | ICD-10-CM | POA: Diagnosis not present

## 2020-06-01 DIAGNOSIS — Z23 Encounter for immunization: Secondary | ICD-10-CM | POA: Diagnosis not present

## 2020-06-16 DIAGNOSIS — N200 Calculus of kidney: Secondary | ICD-10-CM | POA: Diagnosis not present

## 2020-06-22 DIAGNOSIS — H353131 Nonexudative age-related macular degeneration, bilateral, early dry stage: Secondary | ICD-10-CM | POA: Diagnosis not present

## 2020-07-20 DIAGNOSIS — C7802 Secondary malignant neoplasm of left lung: Secondary | ICD-10-CM | POA: Diagnosis not present

## 2020-07-20 DIAGNOSIS — Z0000 Encounter for general adult medical examination without abnormal findings: Secondary | ICD-10-CM | POA: Diagnosis not present

## 2020-07-20 DIAGNOSIS — E7849 Other hyperlipidemia: Secondary | ICD-10-CM | POA: Diagnosis not present

## 2020-10-05 ENCOUNTER — Other Ambulatory Visit (HOSPITAL_BASED_OUTPATIENT_CLINIC_OR_DEPARTMENT_OTHER): Payer: Medicare Other

## 2020-10-12 ENCOUNTER — Ambulatory Visit (HOSPITAL_BASED_OUTPATIENT_CLINIC_OR_DEPARTMENT_OTHER)
Admission: RE | Admit: 2020-10-12 | Discharge: 2020-10-12 | Disposition: A | Discharge status: Home / Self Care | Payer: Medicare Other | Source: Ambulatory Visit | Attending: Hematology & Oncology | Admitting: Hematology & Oncology

## 2020-10-12 ENCOUNTER — Encounter: Payer: Self-pay | Admitting: Hematology & Oncology

## 2020-10-12 ENCOUNTER — Encounter (HOSPITAL_BASED_OUTPATIENT_CLINIC_OR_DEPARTMENT_OTHER): Payer: Self-pay

## 2020-10-12 ENCOUNTER — Other Ambulatory Visit: Payer: Self-pay

## 2020-10-12 ENCOUNTER — Inpatient Hospital Stay (HOSPITAL_BASED_OUTPATIENT_CLINIC_OR_DEPARTMENT_OTHER): Payer: Medicare Other | Admitting: Hematology & Oncology

## 2020-10-12 ENCOUNTER — Inpatient Hospital Stay: Payer: Medicare Other | Attending: Hematology & Oncology

## 2020-10-12 VITALS — BP 135/77 | HR 86 | Temp 98.1°F | Resp 18 | Wt 165.2 lb

## 2020-10-12 DIAGNOSIS — C187 Malignant neoplasm of sigmoid colon: Secondary | ICD-10-CM

## 2020-10-12 DIAGNOSIS — N200 Calculus of kidney: Secondary | ICD-10-CM | POA: Diagnosis not present

## 2020-10-12 DIAGNOSIS — I700 Atherosclerosis of aorta: Secondary | ICD-10-CM | POA: Diagnosis not present

## 2020-10-12 DIAGNOSIS — C189 Malignant neoplasm of colon, unspecified: Secondary | ICD-10-CM | POA: Diagnosis not present

## 2020-10-12 DIAGNOSIS — J984 Other disorders of lung: Secondary | ICD-10-CM | POA: Diagnosis not present

## 2020-10-12 DIAGNOSIS — K449 Diaphragmatic hernia without obstruction or gangrene: Secondary | ICD-10-CM | POA: Diagnosis not present

## 2020-10-12 LAB — CBC WITH DIFFERENTIAL (CANCER CENTER ONLY)
Abs Immature Granulocytes: 0.02 10*3/uL (ref 0.00–0.07)
Basophils Absolute: 0 10*3/uL (ref 0.0–0.1)
Basophils Relative: 0 %
Eosinophils Absolute: 0.1 10*3/uL (ref 0.0–0.5)
Eosinophils Relative: 2 %
HCT: 37.7 % — ABNORMAL LOW (ref 39.0–52.0)
Hemoglobin: 13.1 g/dL (ref 13.0–17.0)
Immature Granulocytes: 0 %
Lymphocytes Relative: 26 %
Lymphs Abs: 1.5 10*3/uL (ref 0.7–4.0)
MCH: 31.2 pg (ref 26.0–34.0)
MCHC: 34.7 g/dL (ref 30.0–36.0)
MCV: 89.8 fL (ref 80.0–100.0)
Monocytes Absolute: 0.7 10*3/uL (ref 0.1–1.0)
Monocytes Relative: 12 %
Neutro Abs: 3.6 10*3/uL (ref 1.7–7.7)
Neutrophils Relative %: 60 %
Platelet Count: 144 10*3/uL — ABNORMAL LOW (ref 150–400)
RBC: 4.2 MIL/uL — ABNORMAL LOW (ref 4.22–5.81)
RDW: 12.2 % (ref 11.5–15.5)
WBC Count: 5.9 10*3/uL (ref 4.0–10.5)
nRBC: 0 % (ref 0.0–0.2)

## 2020-10-12 LAB — CMP (CANCER CENTER ONLY)
ALT: 17 U/L (ref 0–44)
AST: 25 U/L (ref 15–41)
Albumin: 4.5 g/dL (ref 3.5–5.0)
Alkaline Phosphatase: 71 U/L (ref 38–126)
Anion gap: 5 (ref 5–15)
BUN: 18 mg/dL (ref 8–23)
CO2: 33 mmol/L — ABNORMAL HIGH (ref 22–32)
Calcium: 9.8 mg/dL (ref 8.9–10.3)
Chloride: 103 mmol/L (ref 98–111)
Creatinine: 1.25 mg/dL — ABNORMAL HIGH (ref 0.61–1.24)
GFR, Estimated: >60 mL/min (ref >60)
Glucose, Bld: 106 mg/dL — ABNORMAL HIGH (ref 70–99)
Potassium: 4.9 mmol/L (ref 3.5–5.1)
Sodium: 141 mmol/L (ref 135–145)
Total Bilirubin: 0.9 mg/dL (ref 0.3–1.2)
Total Protein: 6.7 g/dL (ref 6.5–8.1)

## 2020-10-12 LAB — CEA (IN HOUSE-CHCC): CEA (CHCC-In House): 2.05 ng/mL (ref 0.00–5.00)

## 2020-10-12 MED ORDER — IOHEXOL 300 MG/ML  SOLN
100.0000 mL | Freq: Once | INTRAMUSCULAR | Status: AC | PRN
  Administered 2020-10-12: 100 mL via INTRAVENOUS

## 2020-10-12 NOTE — Progress Notes (Signed)
Hematology and Oncology Follow Up Visit  Jack Gray [id number] [birth year] 75 y.o. 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  He feels well.  He has had no complaint since we last saw him.  We did going to a CT scan on him today.  There is no evidence of any change with respect to the right middle lobe nodule that he had the radiosurgery for.  He has had no issues with nausea or vomiting.  He has had no change in bowel or bladder habits.  He has had no cough or shortness of breath.  Is still being very diligent with respect to the coronavirus.  He has had no problems with rashes.  There is been no leg swelling.  Overall, his performance status is ECOG 0.    Medications:  Current Outpatient Medications:  .  atorvastatin (LIPITOR) 20 MG tablet, Take 20 mg by mouth every evening. , Disp: , Rfl:  .  b complex vitamins capsule, Take 1 capsule by mouth daily., Disp: , Rfl:  .  Cholecalciferol (VITAMIN D) 2000 units CAPS, Take 2,000 Units by mouth daily., Disp: , Rfl:  .  Glucosamine HCl (GLUCOSAMINE PO), Take 1 tablet by mouth 2 (two) times daily., Disp: , Rfl:  .  Multiple Vitamins-Minerals (PRESERVISION AREDS PO), Take 1 capsule by mouth 2 (two) times daily. , Disp: , Rfl:   Allergies: No Known Allergies  Past Medical History, Surgical history, Social history, and Family History were reviewed and updated.  Review of Systems: Review of Systems  Constitutional: Negative for appetite change, fatigue, fever and unexpected weight change.  HENT:   Negative for lump/mass, mouth sores, sore throat and trouble swallowing.   Respiratory: Negative  for cough, hemoptysis and shortness of breath.   Cardiovascular: Negative for leg swelling and palpitations.  Gastrointestinal: Negative for abdominal distention, abdominal pain, blood in stool, constipation, diarrhea, nausea and vomiting.  Genitourinary: Negative for bladder incontinence, dysuria, frequency and hematuria.   Musculoskeletal: Negative for arthralgias, back pain, gait problem and myalgias.  Skin: Negative for itching and rash.  Neurological: Negative for dizziness, extremity weakness, gait problem, headaches, numbness, seizures and speech difficulty.  Hematological: Does not bruise/bleed easily.  Psychiatric/Behavioral: Negative for depression and sleep disturbance. The patient is not nervous/anxious.     Physical Exam:  weight is 165 lb 4 oz (75 kg). His oral temperature is 98.1 F (36.7 C). His blood pressure is 135/77 and his pulse is 86. His respiration is 18 and oxygen saturation is 100%.   Wt Readings from Last 3 Encounters:  10/12/20 165 lb 4 oz (75 kg)  04/14/20 162 lb 12 oz (73.8 kg)  12/10/19 163 lb 1.9 oz (74 kg)    Physical Exam Vitals reviewed.  HENT:     Head: Normocephalic and atraumatic.  Eyes:     Pupils: Pupils are equal, round, and reactive to light.  Cardiovascular:     Rate and Rhythm: Normal rate and regular rhythm.     Heart sounds: Normal heart sounds.  Pulmonary:     Effort: Pulmonary effort is normal.  Breath sounds: Normal breath sounds.  Abdominal:     General: Bowel sounds are normal.     Palpations: Abdomen is soft.  Musculoskeletal:        General: No tenderness or deformity. Normal range of motion.     Cervical back: Normal range of motion.  Lymphadenopathy:     Cervical: No cervical adenopathy.  Skin:    General: Skin is warm and dry.     Findings: No erythema or rash.  Neurological:     Mental Status: He is alert and oriented to person, place, and time.  Psychiatric:        Behavior: Behavior normal.        Thought  Content: Thought content normal.        Judgment: Judgment normal.      Lab Results  Component Value Date   WBC 5.9 10/12/2020   HGB 13.1 10/12/2020   HCT 37.7 (L) 10/12/2020   MCV 89.8 10/12/2020   PLT 144 (L) 10/12/2020     Chemistry      Component Value Date/Time   NA 141 10/12/2020 0756   NA 141 08/08/2017 0802   K 4.9 10/12/2020 0756   K 3.7 08/08/2017 0802   CL 103 10/12/2020 0756   CL 105 08/08/2017 0802   CO2 33 (H) 10/12/2020 0756   CO2 29 08/08/2017 0802   BUN 18 10/12/2020 0756   BUN 15 08/08/2017 0802   CREATININE 1.25 (H) 10/12/2020 0756   CREATININE 1.1 08/08/2017 0802      Component Value Date/Time   CALCIUM 9.8 10/12/2020 0756   CALCIUM 9.2 08/08/2017 0802   ALKPHOS 71 10/12/2020 0756   ALKPHOS 86 (H) 08/08/2017 0802   AST 25 10/12/2020 0756   ALT 17 10/12/2020 0756   ALT 64 (H) 08/08/2017 0802   BILITOT 0.9 10/12/2020 0756       Impression and Plan: Jack Gray is a 75 year old white male.  He had a stage IIIb adenocarcinoma of the sigmoid colon.  He had this resected.  Even though he had negative lymph nodes, he had 2 nodules which may have been lymph nodes.  He received adjuvant chemotherapy with FOLFOX.  He then had this recurrence which was fairly early after completing the FOLFOX chemotherapy.  Thankfully, he had a solitary recurrence.  He had this resected.  He then had a second recurrence.  This was also in the lung.  He completed SBRT in April, 2020.  We still need to follow along with scans.  He obviously is at risk for another recurrence.  However, I think we now go 6 months with our next scan.  His first great grandchild will be born at the end of March.  He really is looking forward to this.  I am so happy for him.     Lattie Haw, MD

## 2020-11-10 DIAGNOSIS — Z20822 Contact with and (suspected) exposure to covid-19: Secondary | ICD-10-CM | POA: Diagnosis not present

## 2020-12-20 DIAGNOSIS — L812 Freckles: Secondary | ICD-10-CM | POA: Diagnosis not present

## 2020-12-20 DIAGNOSIS — L84 Corns and callosities: Secondary | ICD-10-CM | POA: Diagnosis not present

## 2020-12-20 DIAGNOSIS — L570 Actinic keratosis: Secondary | ICD-10-CM | POA: Diagnosis not present

## 2020-12-20 DIAGNOSIS — L821 Other seborrheic keratosis: Secondary | ICD-10-CM | POA: Diagnosis not present

## 2020-12-20 DIAGNOSIS — Z85828 Personal history of other malignant neoplasm of skin: Secondary | ICD-10-CM | POA: Diagnosis not present

## 2020-12-20 DIAGNOSIS — L820 Inflamed seborrheic keratosis: Secondary | ICD-10-CM | POA: Diagnosis not present

## 2021-02-04 DIAGNOSIS — T451X5S Adverse effect of antineoplastic and immunosuppressive drugs, sequela: Secondary | ICD-10-CM | POA: Diagnosis not present

## 2021-02-04 DIAGNOSIS — G620 Drug-induced polyneuropathy: Secondary | ICD-10-CM | POA: Diagnosis not present

## 2021-02-04 DIAGNOSIS — D490 Neoplasm of unspecified behavior of digestive system: Secondary | ICD-10-CM | POA: Diagnosis not present

## 2021-02-04 DIAGNOSIS — C7802 Secondary malignant neoplasm of left lung: Secondary | ICD-10-CM | POA: Diagnosis not present

## 2021-02-04 DIAGNOSIS — E7841 Elevated Lipoprotein(a): Secondary | ICD-10-CM | POA: Diagnosis not present

## 2021-03-02 ENCOUNTER — Ambulatory Visit: Payer: Medicare Other | Attending: Internal Medicine

## 2021-03-02 DIAGNOSIS — Z23 Encounter for immunization: Secondary | ICD-10-CM

## 2021-03-02 NOTE — Progress Notes (Signed)
   Covid-19 Vaccination Clinic  Name:  Jack Gray    MRN: [id number] DOB: May 10, 1946  03/02/2021  [name] was observed post Covid-19 immunization for 15 minutes without incident. He was provided with Vaccine Information Sheet and instruction to access the V-Safe system.   [name] was instructed to call 911 with any severe reactions post vaccine: Difficulty breathing  Swelling of face and throat  A fast heartbeat  A bad rash all over body  Dizziness and weakness   Immunizations Administered     Name Date Dose VIS Date Route   PFIZER Comrnaty(Gray TOP) Covid-19 Vaccine 03/02/2021 10:07 AM 0.3 mL 07/15/2020 Intramuscular   Manufacturer: [place]   Lot: [id number]   [place]: [phone]

## 2021-03-08 ENCOUNTER — Other Ambulatory Visit (HOSPITAL_BASED_OUTPATIENT_CLINIC_OR_DEPARTMENT_OTHER): Payer: Self-pay

## 2021-03-08 DIAGNOSIS — Z23 Encounter for immunization: Secondary | ICD-10-CM | POA: Diagnosis not present

## 2021-03-08 MED ORDER — COVID-19 MRNA VAC-TRIS(PFIZER) 30 MCG/0.3ML IM SUSP
INTRAMUSCULAR | 0 refills | Status: DC
  Filled 2021-03-08: qty 0.3, 1d supply, fill #0

## 2021-04-12 ENCOUNTER — Other Ambulatory Visit (HOSPITAL_BASED_OUTPATIENT_CLINIC_OR_DEPARTMENT_OTHER): Payer: Self-pay

## 2021-04-14 ENCOUNTER — Other Ambulatory Visit: Payer: Self-pay

## 2021-04-14 ENCOUNTER — Inpatient Hospital Stay (HOSPITAL_BASED_OUTPATIENT_CLINIC_OR_DEPARTMENT_OTHER): Payer: Medicare Other | Admitting: Hematology & Oncology

## 2021-04-14 ENCOUNTER — Inpatient Hospital Stay: Payer: Medicare Other | Attending: Hematology & Oncology

## 2021-04-14 ENCOUNTER — Ambulatory Visit (HOSPITAL_BASED_OUTPATIENT_CLINIC_OR_DEPARTMENT_OTHER)
Admission: RE | Admit: 2021-04-14 | Discharge: 2021-04-14 | Disposition: A | Discharge status: Home / Self Care | Payer: Medicare Other | Source: Ambulatory Visit | Attending: Hematology & Oncology | Admitting: Hematology & Oncology

## 2021-04-14 ENCOUNTER — Telehealth: Payer: Self-pay

## 2021-04-14 ENCOUNTER — Encounter: Payer: Self-pay | Admitting: Hematology & Oncology

## 2021-04-14 VITALS — BP 134/76 | HR 80 | Temp 98.3°F | Resp 20 | Wt 162.8 lb

## 2021-04-14 DIAGNOSIS — C187 Malignant neoplasm of sigmoid colon: Secondary | ICD-10-CM | POA: Diagnosis not present

## 2021-04-14 DIAGNOSIS — J984 Other disorders of lung: Secondary | ICD-10-CM | POA: Diagnosis not present

## 2021-04-14 DIAGNOSIS — K449 Diaphragmatic hernia without obstruction or gangrene: Secondary | ICD-10-CM | POA: Diagnosis not present

## 2021-04-14 DIAGNOSIS — C19 Malignant neoplasm of rectosigmoid junction: Secondary | ICD-10-CM | POA: Diagnosis not present

## 2021-04-14 DIAGNOSIS — N200 Calculus of kidney: Secondary | ICD-10-CM | POA: Diagnosis not present

## 2021-04-14 DIAGNOSIS — I700 Atherosclerosis of aorta: Secondary | ICD-10-CM | POA: Diagnosis not present

## 2021-04-14 DIAGNOSIS — K7689 Other specified diseases of liver: Secondary | ICD-10-CM | POA: Diagnosis not present

## 2021-04-14 DIAGNOSIS — I2510 Atherosclerotic heart disease of native coronary artery without angina pectoris: Secondary | ICD-10-CM | POA: Diagnosis not present

## 2021-04-14 LAB — CBC WITH DIFFERENTIAL (CANCER CENTER ONLY)
Abs Immature Granulocytes: 0.03 10*3/uL (ref 0.00–0.07)
Basophils Absolute: 0 10*3/uL (ref 0.0–0.1)
Basophils Relative: 0 %
Eosinophils Absolute: 0.1 10*3/uL (ref 0.0–0.5)
Eosinophils Relative: 2 %
HCT: 36.6 % — ABNORMAL LOW (ref 39.0–52.0)
Hemoglobin: 12.6 g/dL — ABNORMAL LOW (ref 13.0–17.0)
Immature Granulocytes: 1 %
Lymphocytes Relative: 22 %
Lymphs Abs: 1.4 10*3/uL (ref 0.7–4.0)
MCH: 30.9 pg (ref 26.0–34.0)
MCHC: 34.4 g/dL (ref 30.0–36.0)
MCV: 89.7 fL (ref 80.0–100.0)
Monocytes Absolute: 0.7 10*3/uL (ref 0.1–1.0)
Monocytes Relative: 11 %
Neutro Abs: 4 10*3/uL (ref 1.7–7.7)
Neutrophils Relative %: 64 %
Platelet Count: 154 10*3/uL (ref 150–400)
RBC: 4.08 MIL/uL — ABNORMAL LOW (ref 4.22–5.81)
RDW: 12.4 % (ref 11.5–15.5)
WBC Count: 6.2 10*3/uL (ref 4.0–10.5)
nRBC: 0 % (ref 0.0–0.2)

## 2021-04-14 LAB — CMP (CANCER CENTER ONLY)
ALT: 18 U/L (ref 0–44)
AST: 26 U/L (ref 15–41)
Albumin: 4.3 g/dL (ref 3.5–5.0)
Alkaline Phosphatase: 70 U/L (ref 38–126)
Anion gap: 6 (ref 5–15)
BUN: 22 mg/dL (ref 8–23)
CO2: 30 mmol/L (ref 22–32)
Calcium: 9.7 mg/dL (ref 8.9–10.3)
Chloride: 104 mmol/L (ref 98–111)
Creatinine: 1.22 mg/dL (ref 0.61–1.24)
GFR, Estimated: >60 mL/min (ref >60)
Glucose, Bld: 102 mg/dL — ABNORMAL HIGH (ref 70–99)
Potassium: 5.1 mmol/L (ref 3.5–5.1)
Sodium: 140 mmol/L (ref 135–145)
Total Bilirubin: 1 mg/dL (ref 0.3–1.2)
Total Protein: 6.6 g/dL (ref 6.5–8.1)

## 2021-04-14 LAB — CEA (IN HOUSE-CHCC): CEA (CHCC-In House): 1.55 ng/mL (ref 0.00–5.00)

## 2021-04-14 MED ORDER — IOHEXOL 350 MG/ML SOLN
100.0000 mL | Freq: Once | INTRAVENOUS | Status: AC | PRN
  Administered 2021-04-14: 85 mL via INTRAVENOUS

## 2021-04-14 NOTE — Progress Notes (Signed)
Hematology and Oncology Follow Up Visit  Jack Gray [id number] Sep 04, 1945 75 y.o. 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  As always, he is really been quite busy.  [place]  They do little trips here and there.  He feels well.  His wife is doing okay.  She does have her back issues.  He did have a CT scan done today.  Thankfully, the CT scan did not show anything that looked suspicious for any kind of active growth.  Has a stable lung nodule at 10 mm.  There is radiation changes around this nodule.  The big news is the now have a new great grandson.  He was born in March.  I am so happy for he and his wife.  He has had no cough or shortness of breath.  He has had no issues with COVID.  There is been no problems with bowels or bladder.  He has had no issues with bleeding.  There is been no problems with fever.  He has had no leg swelling.  He says on occasion, his right ankle does have a little bit of swelling.  Overall, his performance status is ECOG 0.     Medications:  Current Outpatient Medications:    atorvastatin (LIPITOR) 20 MG tablet, Take 20 mg by mouth every evening. , Disp: , Rfl:    b complex vitamins capsule, Take 1 capsule by mouth daily., Disp: , Rfl:    Cholecalciferol (VITAMIN D) 2000 units CAPS, Take 2,000 Units by mouth daily., Disp: , Rfl:    Glucosamine HCl (GLUCOSAMINE PO), Take 1 tablet by mouth 2 (two) times daily., Disp: , Rfl:    Multiple Vitamins-Minerals (PRESERVISION AREDS PO), Take 1 capsule by mouth 2 (two) times daily. , Disp: , Rfl:   Allergies: No Known Allergies  Past  Medical History, Surgical history, Social history, and Family History were reviewed and updated.  Review of Systems: Review of Systems  Constitutional:  Negative for appetite change, fatigue, fever and unexpected weight change.  HENT:   Negative for lump/mass, mouth sores, sore throat and trouble swallowing.   Respiratory:  Negative for cough, hemoptysis and shortness of breath.   Cardiovascular:  Negative for leg swelling and palpitations.  Gastrointestinal:  Negative for abdominal distention, abdominal pain, blood in stool, constipation, diarrhea, nausea and vomiting.  Genitourinary:  Negative for bladder incontinence, dysuria, frequency and hematuria.   Musculoskeletal:  Negative for arthralgias, back pain, gait problem and myalgias.  Skin:  Negative for itching and rash.  Neurological:  Negative for dizziness, extremity weakness, gait problem, headaches, numbness, seizures and speech difficulty.  Hematological:  Does not bruise/bleed easily.  Psychiatric/Behavioral:  Negative for depression and sleep disturbance. The patient is not nervous/anxious.    Physical Exam:  weight is 162 lb 12.8 oz (73.8 kg). His oral temperature is 98.3 F (36.8 C). His blood pressure is 134/76 and his pulse is 80. His respiration is 20 and oxygen saturation is 98%.   Wt Readings from Last 3 Encounters:  04/14/21 162 lb 12.8 oz (73.8 kg)  10/12/20  165 lb 4 oz (75 kg)  04/14/20 162 lb 12 oz (73.8 kg)    Physical Exam Vitals reviewed.  HENT:     Head: Normocephalic and atraumatic.  Eyes:     Pupils: Pupils are equal, round, and reactive to light.  Cardiovascular:     Rate and Rhythm: Normal rate and regular rhythm.     Heart sounds: Normal heart sounds.  Pulmonary:     Effort: Pulmonary effort is normal.     Breath sounds: Normal breath sounds.  Abdominal:     General: Bowel sounds are normal.     Palpations: Abdomen is soft.  Musculoskeletal:        General: No tenderness or deformity. Normal  range of motion.     Cervical back: Normal range of motion.  Lymphadenopathy:     Cervical: No cervical adenopathy.  Skin:    General: Skin is warm and dry.     Findings: No erythema or rash.  Neurological:     Mental Status: He is alert and oriented to person, place, and time.  Psychiatric:        Behavior: Behavior normal.        Thought Content: Thought content normal.        Judgment: Judgment normal.     Lab Results  Component Value Date   WBC 6.2 04/14/2021   HGB 12.6 (L) 04/14/2021   HCT 36.6 (L) 04/14/2021   MCV 89.7 04/14/2021   PLT 154 04/14/2021     Chemistry      Component Value Date/Time   NA 140 04/14/2021 0823   NA 141 08/08/2017 0802   K 5.1 04/14/2021 0823   K 3.7 08/08/2017 0802   CL 104 04/14/2021 0823   CL 105 08/08/2017 0802   CO2 30 04/14/2021 0823   CO2 29 08/08/2017 0802   BUN 22 04/14/2021 0823   BUN 15 08/08/2017 0802   CREATININE 1.22 04/14/2021 0823   CREATININE 1.1 08/08/2017 0802      Component Value Date/Time   CALCIUM 9.7 04/14/2021 0823   CALCIUM 9.2 08/08/2017 0802   ALKPHOS 70 04/14/2021 0823   ALKPHOS 86 (H) 08/08/2017 0802   AST 26 04/14/2021 0823   ALT 18 04/14/2021 0823   ALT 64 (H) 08/08/2017 0802   BILITOT 1.0 04/14/2021 0823       Impression and Plan: Jack Gray is a 75 year old white male.  He had a stage IIIb adenocarcinoma of the sigmoid colon.  He had this resected.  Even though he had negative lymph nodes, he had 2 nodules which may have been lymph nodes.  He received adjuvant chemotherapy with FOLFOX.  He then had this recurrence which was fairly early after completing the FOLFOX chemotherapy.  Thankfully, he had a solitary recurrence.  He had this resected.  He then had a second recurrence.  This was also in the lung.  He completed SBRT in April, 2020.  We still need to follow along with scans.  He obviously is at risk for another recurrence.  However, I think we now go 6 months with our next scan.    Lattie Haw, MD

## 2021-05-02 DIAGNOSIS — Z23 Encounter for immunization: Secondary | ICD-10-CM | POA: Diagnosis not present

## 2021-05-16 ENCOUNTER — Ambulatory Visit: Payer: Medicare Other | Attending: Internal Medicine

## 2021-05-16 DIAGNOSIS — Z23 Encounter for immunization: Secondary | ICD-10-CM

## 2021-05-16 NOTE — Progress Notes (Signed)
   Covid-19 Vaccination Clinic  Name:  Jack Gray    MRN: [id number] DOB: 04/03/1946  05/16/2021  [name] was observed post Covid-19 immunization for 15 minutes without incident. He was provided with Vaccine Information Sheet and instruction to access the V-Safe system.   [name] was instructed to call 911 with any severe reactions post vaccine: Difficulty breathing  Swelling of face and throat  A fast heartbeat  A bad rash all over body  Dizziness and weakness

## 2021-05-25 ENCOUNTER — Other Ambulatory Visit (HOSPITAL_BASED_OUTPATIENT_CLINIC_OR_DEPARTMENT_OTHER): Payer: Self-pay

## 2021-05-25 DIAGNOSIS — Z23 Encounter for immunization: Secondary | ICD-10-CM | POA: Diagnosis not present

## 2021-05-25 MED ORDER — COVID-19MRNA BIVAL VACC PFIZER 30 MCG/0.3ML IM SUSP
INTRAMUSCULAR | 0 refills | Status: DC
  Filled 2021-05-25: qty 0.3, 1d supply, fill #0

## 2021-06-16 DIAGNOSIS — Z20822 Contact with and (suspected) exposure to covid-19: Secondary | ICD-10-CM | POA: Diagnosis not present

## 2021-06-21 DIAGNOSIS — H2513 Age-related nuclear cataract, bilateral: Secondary | ICD-10-CM | POA: Diagnosis not present

## 2021-06-24 DIAGNOSIS — R351 Nocturia: Secondary | ICD-10-CM | POA: Diagnosis not present

## 2021-06-24 DIAGNOSIS — N200 Calculus of kidney: Secondary | ICD-10-CM | POA: Diagnosis not present

## 2021-08-30 DIAGNOSIS — E782 Mixed hyperlipidemia: Secondary | ICD-10-CM | POA: Diagnosis not present

## 2021-08-30 DIAGNOSIS — T451X5S Adverse effect of antineoplastic and immunosuppressive drugs, sequela: Secondary | ICD-10-CM | POA: Diagnosis not present

## 2021-08-30 DIAGNOSIS — Z0000 Encounter for general adult medical examination without abnormal findings: Secondary | ICD-10-CM | POA: Diagnosis not present

## 2021-08-30 DIAGNOSIS — C7802 Secondary malignant neoplasm of left lung: Secondary | ICD-10-CM | POA: Diagnosis not present

## 2021-08-30 DIAGNOSIS — G620 Drug-induced polyneuropathy: Secondary | ICD-10-CM | POA: Diagnosis not present

## 2021-08-30 DIAGNOSIS — D490 Neoplasm of unspecified behavior of digestive system: Secondary | ICD-10-CM | POA: Diagnosis not present

## 2021-08-30 DIAGNOSIS — Z1389 Encounter for screening for other disorder: Secondary | ICD-10-CM | POA: Diagnosis not present

## 2021-10-13 ENCOUNTER — Ambulatory Visit (HOSPITAL_BASED_OUTPATIENT_CLINIC_OR_DEPARTMENT_OTHER)
Admission: RE | Admit: 2021-10-13 | Discharge: 2021-10-13 | Disposition: A | Discharge status: Home / Self Care | Payer: Medicare Other | Source: Ambulatory Visit | Attending: Hematology & Oncology | Admitting: Hematology & Oncology

## 2021-10-13 ENCOUNTER — Telehealth: Payer: Self-pay | Admitting: *Deleted

## 2021-10-13 ENCOUNTER — Encounter: Payer: Self-pay | Admitting: Hematology & Oncology

## 2021-10-13 ENCOUNTER — Inpatient Hospital Stay (HOSPITAL_BASED_OUTPATIENT_CLINIC_OR_DEPARTMENT_OTHER): Payer: Medicare Other | Admitting: Hematology & Oncology

## 2021-10-13 ENCOUNTER — Other Ambulatory Visit: Payer: Self-pay

## 2021-10-13 ENCOUNTER — Inpatient Hospital Stay: Payer: Medicare Other | Attending: Hematology & Oncology

## 2021-10-13 ENCOUNTER — Encounter (HOSPITAL_BASED_OUTPATIENT_CLINIC_OR_DEPARTMENT_OTHER): Payer: Self-pay

## 2021-10-13 VITALS — BP 116/72 | HR 100 | Temp 98.0°F | Resp 18 | Ht 68.11 in | Wt 164.1 lb

## 2021-10-13 DIAGNOSIS — C187 Malignant neoplasm of sigmoid colon: Secondary | ICD-10-CM | POA: Insufficient documentation

## 2021-10-13 DIAGNOSIS — N200 Calculus of kidney: Secondary | ICD-10-CM | POA: Diagnosis not present

## 2021-10-13 DIAGNOSIS — C189 Malignant neoplasm of colon, unspecified: Secondary | ICD-10-CM | POA: Diagnosis not present

## 2021-10-13 DIAGNOSIS — I700 Atherosclerosis of aorta: Secondary | ICD-10-CM | POA: Diagnosis not present

## 2021-10-13 DIAGNOSIS — I2510 Atherosclerotic heart disease of native coronary artery without angina pectoris: Secondary | ICD-10-CM | POA: Diagnosis not present

## 2021-10-13 DIAGNOSIS — K449 Diaphragmatic hernia without obstruction or gangrene: Secondary | ICD-10-CM | POA: Diagnosis not present

## 2021-10-13 DIAGNOSIS — J8410 Pulmonary fibrosis, unspecified: Secondary | ICD-10-CM | POA: Diagnosis not present

## 2021-10-13 DIAGNOSIS — K5730 Diverticulosis of large intestine without perforation or abscess without bleeding: Secondary | ICD-10-CM | POA: Diagnosis not present

## 2021-10-13 LAB — CMP (CANCER CENTER ONLY)
ALT: 22 U/L (ref 0–44)
AST: 30 U/L (ref 15–41)
Albumin: 4.4 g/dL (ref 3.5–5.0)
Alkaline Phosphatase: 83 U/L (ref 38–126)
Anion gap: 6 (ref 5–15)
BUN: 23 mg/dL (ref 8–23)
CO2: 31 mmol/L (ref 22–32)
Calcium: 9.4 mg/dL (ref 8.9–10.3)
Chloride: 101 mmol/L (ref 98–111)
Creatinine: 1.25 mg/dL — ABNORMAL HIGH (ref 0.61–1.24)
GFR, Estimated: >60 mL/min (ref >60)
Glucose, Bld: 106 mg/dL — ABNORMAL HIGH (ref 70–99)
Potassium: 4.7 mmol/L (ref 3.5–5.1)
Sodium: 138 mmol/L (ref 135–145)
Total Bilirubin: 0.7 mg/dL (ref 0.3–1.2)
Total Protein: 6.8 g/dL (ref 6.5–8.1)

## 2021-10-13 LAB — CBC WITH DIFFERENTIAL (CANCER CENTER ONLY)
Abs Immature Granulocytes: 0.02 10*3/uL (ref 0.00–0.07)
Basophils Absolute: 0 10*3/uL (ref 0.0–0.1)
Basophils Relative: 1 %
Eosinophils Absolute: 0.1 10*3/uL (ref 0.0–0.5)
Eosinophils Relative: 2 %
HCT: 37.6 % — ABNORMAL LOW (ref 39.0–52.0)
Hemoglobin: 13.3 g/dL (ref 13.0–17.0)
Immature Granulocytes: 0 %
Lymphocytes Relative: 23 %
Lymphs Abs: 1.4 10*3/uL (ref 0.7–4.0)
MCH: 31.1 pg (ref 26.0–34.0)
MCHC: 35.4 g/dL (ref 30.0–36.0)
MCV: 87.9 fL (ref 80.0–100.0)
Monocytes Absolute: 0.6 10*3/uL (ref 0.1–1.0)
Monocytes Relative: 10 %
Neutro Abs: 4 10*3/uL (ref 1.7–7.7)
Neutrophils Relative %: 64 %
Platelet Count: 159 10*3/uL (ref 150–400)
RBC: 4.28 MIL/uL (ref 4.22–5.81)
RDW: 12.2 % (ref 11.5–15.5)
WBC Count: 6.2 10*3/uL (ref 4.0–10.5)
nRBC: 0 % (ref 0.0–0.2)

## 2021-10-13 LAB — CEA (IN HOUSE-CHCC): CEA (CHCC-In House): 2.19 ng/mL (ref 0.00–5.00)

## 2021-10-13 MED ORDER — IOHEXOL 300 MG/ML  SOLN
100.0000 mL | Freq: Once | INTRAMUSCULAR | Status: AC | PRN
  Administered 2021-10-13: 09:00:00 100 mL via INTRAVENOUS

## 2021-10-13 NOTE — Telephone Encounter (Signed)
Per Dr. Marin Olp, I called patient and gave him the results of the CT scan. There is nothing obvious, no new nodules or evidence of metastatic disease in the chest, abdomen or pelvis. Repeat scan in six months. He verbalized understanding.  ?

## 2021-10-13 NOTE — Telephone Encounter (Signed)
Per 10/13/21 los - gave upcoming appointments - confirmed ?

## 2021-10-13 NOTE — Progress Notes (Signed)
?Hematology and Oncology Follow Up Visit ? ?Jack Gray ?[id number] ?Dec 07, 1945 76 y.o. ?10/13/2021 ? ? 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 ?Current Therapy:   ?Status post cycle #8 of adjuvant FOLFOX (oxaliplatin d/c'ed w/  cycle #6) - completed on 10/03/2017 ?Resection of lung met -- solitary -- on 08/08/2018 ?SBRT -- RIGHT lung met -- 11/2018 ?    ?Interim History:  Jack Gray is back for follow-up.  Unfortunately, we do not have his CAT scan result back yet. ? ?He is doing well.  He and his wife have been doing some traveling.  They go to the beach.  They will be going to the [employer] in April. ? ?He has had no problems with cough or shortness of breath.  He has had no COVID issues.  He has had no change in bowel or bladder habits.  He has had no bleeding.  He has had no leg swelling. ? ?The big news is that his wife's back pain is totally gone.  I am so happy for this.  I know she was suffering from the back issues. ? ?His last CEA level was 1.55 back in September. ? ?Overall, his performance status is ECOG 0.  ? ?Medications:  ?Current Outpatient Medications:  ?  atorvastatin (LIPITOR) 20 MG tablet, Take 20 mg by mouth every evening. , Disp: , Rfl:  ?  b complex vitamins capsule, Take 1 capsule by mouth daily., Disp: , Rfl:  ?  Cholecalciferol (VITAMIN D) 2000 units CAPS, Take 2,000 Units by mouth daily., Disp: , Rfl:  ?  Glucosamine HCl (GLUCOSAMINE PO), Take 1 tablet by mouth 2 (two) times daily., Disp: , Rfl:  ?  Multiple Vitamins-Minerals (PRESERVISION AREDS PO), Take 1 capsule by mouth 2 (two) times daily. , Disp: , Rfl:  ? ?Allergies: No Known Allergies ? ?Past Medical History, Surgical history, Social history, and Family History were reviewed and updated. ? ?Review of Systems: ?Review of Systems  ?Constitutional:  Negative for appetite change, fatigue, fever and unexpected weight change.  ?HENT:   Negative for lump/mass,  mouth sores, sore throat and trouble swallowing.   ?Respiratory:  Negative for cough, hemoptysis and shortness of breath.   ?Cardiovascular:  Negative for leg swelling and palpitations.  ?Gastrointestinal:  Negative for abdominal distention, abdominal pain, blood in stool, constipation, diarrhea, nausea and vomiting.  ?Genitourinary:  Negative for bladder incontinence, dysuria, frequency and hematuria.   ?Musculoskeletal:  Negative for arthralgias, back pain, gait problem and myalgias.  ?Skin:  Negative for itching and rash.  ?Neurological:  Negative for dizziness, extremity weakness, gait problem, headaches, numbness, seizures and speech difficulty.  ?Hematological:  Does not bruise/bleed easily.  ?Psychiatric/Behavioral:  Negative for depression and sleep disturbance. The patient is not nervous/anxious.   ? ?Physical Exam: ? height is 5' 8.11" (1.73 m) and weight is 164 lb 1.1 oz (74.4 kg). His oral temperature is 98 ?F (36.7 ?C). His blood pressure is 116/72 and his pulse is 100. His respiration is 18 and oxygen saturation is 100%.  ? ?Wt Readings from Last 3 Encounters:  ?10/13/21 164 lb 1.1 oz (74.4 kg)  ?04/14/21 162 lb 12.8 oz (73.8 kg)  ?10/12/20 165 lb 4 oz (75 kg)  ? ? ?Physical Exam ?Vitals reviewed.  ?HENT:  ?   Head: Normocephalic and atraumatic.  ?Eyes:  ?   Pupils: Pupils are equal, round, and reactive to light.  ?Cardiovascular:  ?  Rate and Rhythm: Normal rate and regular rhythm.  ?   Heart sounds: Normal heart sounds.  ?Pulmonary:  ?   Effort: Pulmonary effort is normal.  ?   Breath sounds: Normal breath sounds.  ?Abdominal:  ?   General: Bowel sounds are normal.  ?   Palpations: Abdomen is soft.  ?Musculoskeletal:     ?   General: No tenderness or deformity. Normal range of motion.  ?   Cervical back: Normal range of motion.  ?Lymphadenopathy:  ?   Cervical: No cervical adenopathy.  ?Skin: ?   General: Skin is warm and dry.  ?   Findings: No erythema or rash.  ?Neurological:  ?   Mental Status:  He is alert and oriented to person, place, and time.  ?Psychiatric:     ?   Behavior: Behavior normal.     ?   Thought Content: Thought content normal.     ?   Judgment: Judgment normal.  ? ? ? ?Lab Results  ?Component Value Date  ? WBC 6.2 10/13/2021  ? HGB 13.3 10/13/2021  ? HCT 37.6 (L) 10/13/2021  ? MCV 87.9 10/13/2021  ? PLT 159 10/13/2021  ? ?  Chemistry   ?   ?Component Value Date/Time  ? NA 138 10/13/2021 0815  ? NA 141 08/08/2017 0802  ? K 4.7 10/13/2021 0815  ? K 3.7 08/08/2017 0802  ? CL 101 10/13/2021 0815  ? CL 105 08/08/2017 0802  ? CO2 31 10/13/2021 0815  ? CO2 29 08/08/2017 0802  ? BUN 23 10/13/2021 0815  ? BUN 15 08/08/2017 0802  ? CREATININE 1.25 (H) 10/13/2021 0815  ? CREATININE 1.1 08/08/2017 0802  ?    ?Component Value Date/Time  ? CALCIUM 9.4 10/13/2021 0815  ? CALCIUM 9.2 08/08/2017 0802  ? ALKPHOS 83 10/13/2021 0815  ? ALKPHOS 86 (H) 08/08/2017 0802  ? AST 30 10/13/2021 0815  ? ALT 22 10/13/2021 0815  ? ALT 64 (H) 08/08/2017 0802  ? BILITOT 0.7 10/13/2021 0815  ?  ? ? ? ?Impression and Plan: ?Jack Gray is a 76 year old white male.  He had a stage IIIb adenocarcinoma of the sigmoid colon.  He had this resected.  Even though he had negative lymph nodes, he had 2 nodules which may have been lymph nodes.  He received adjuvant chemotherapy with FOLFOX. ? ?He then had this recurrence which was fairly early after completing the FOLFOX chemotherapy.  Thankfully, he had a solitary recurrence. ? ?He had this resected. ? ?He then had a second recurrence.  This was also in the lung.  He completed SBRT in April, 2020. ? ?I will have to see what his CAT scans look like. ? ?We still need to follow along with scans.  He obviously is at risk for another recurrence.  However, I think we now go 6 months with our next scan. ?  ? ?Lattie Haw, MD ?

## 2021-10-22 DIAGNOSIS — Z20822 Contact with and (suspected) exposure to covid-19: Secondary | ICD-10-CM | POA: Diagnosis not present

## 2021-11-03 DIAGNOSIS — Z20822 Contact with and (suspected) exposure to covid-19: Secondary | ICD-10-CM | POA: Diagnosis not present

## 2021-11-06 DIAGNOSIS — Z20822 Contact with and (suspected) exposure to covid-19: Secondary | ICD-10-CM | POA: Diagnosis not present

## 2021-11-21 DIAGNOSIS — Z20822 Contact with and (suspected) exposure to covid-19: Secondary | ICD-10-CM | POA: Diagnosis not present

## 2021-12-03 DIAGNOSIS — Z20822 Contact with and (suspected) exposure to covid-19: Secondary | ICD-10-CM | POA: Diagnosis not present

## 2021-12-07 DIAGNOSIS — K5730 Diverticulosis of large intestine without perforation or abscess without bleeding: Secondary | ICD-10-CM | POA: Diagnosis not present

## 2021-12-07 DIAGNOSIS — D123 Benign neoplasm of transverse colon: Secondary | ICD-10-CM | POA: Diagnosis not present

## 2021-12-07 DIAGNOSIS — Z85038 Personal history of other malignant neoplasm of large intestine: Secondary | ICD-10-CM | POA: Diagnosis not present

## 2021-12-09 DIAGNOSIS — D123 Benign neoplasm of transverse colon: Secondary | ICD-10-CM | POA: Diagnosis not present

## 2021-12-09 DIAGNOSIS — Z20822 Contact with and (suspected) exposure to covid-19: Secondary | ICD-10-CM | POA: Diagnosis not present

## 2021-12-11 DIAGNOSIS — Z20822 Contact with and (suspected) exposure to covid-19: Secondary | ICD-10-CM | POA: Diagnosis not present

## 2021-12-12 DIAGNOSIS — Z20822 Contact with and (suspected) exposure to covid-19: Secondary | ICD-10-CM | POA: Diagnosis not present

## 2021-12-20 DIAGNOSIS — D2262 Melanocytic nevi of left upper limb, including shoulder: Secondary | ICD-10-CM | POA: Diagnosis not present

## 2021-12-20 DIAGNOSIS — L812 Freckles: Secondary | ICD-10-CM | POA: Diagnosis not present

## 2021-12-20 DIAGNOSIS — L570 Actinic keratosis: Secondary | ICD-10-CM | POA: Diagnosis not present

## 2021-12-20 DIAGNOSIS — D225 Melanocytic nevi of trunk: Secondary | ICD-10-CM | POA: Diagnosis not present

## 2021-12-20 DIAGNOSIS — D2272 Melanocytic nevi of left lower limb, including hip: Secondary | ICD-10-CM | POA: Diagnosis not present

## 2021-12-20 DIAGNOSIS — L821 Other seborrheic keratosis: Secondary | ICD-10-CM | POA: Diagnosis not present

## 2022-02-15 ENCOUNTER — Ambulatory Visit (INDEPENDENT_AMBULATORY_CARE_PROVIDER_SITE_OTHER): Payer: Medicare Other | Admitting: Orthopaedic Surgery

## 2022-02-15 ENCOUNTER — Ambulatory Visit (INDEPENDENT_AMBULATORY_CARE_PROVIDER_SITE_OTHER): Payer: Medicare Other

## 2022-02-15 DIAGNOSIS — M25511 Pain in right shoulder: Secondary | ICD-10-CM

## 2022-02-15 MED ORDER — LIDOCAINE HCL 1 % IJ SOLN
2.0000 mL | INTRAMUSCULAR | Status: AC | PRN
  Administered 2022-02-15: 2 mL

## 2022-02-15 MED ORDER — METHYLPREDNISOLONE ACETATE 40 MG/ML IJ SUSP
80.0000 mg | INTRAMUSCULAR | Status: AC | PRN
  Administered 2022-02-15: 80 mg via INTRA_ARTICULAR

## 2022-02-15 MED ORDER — BUPIVACAINE HCL 0.25 % IJ SOLN
2.0000 mL | INTRAMUSCULAR | Status: AC | PRN
  Administered 2022-02-15: 2 mL via INTRA_ARTICULAR

## 2022-02-15 NOTE — Progress Notes (Signed)
Office Visit Note   Patient: Jack Gray           Date of Birth: 1945/08/25           MRN: [id number] Visit Date: 02/15/2022              Requested by: Vernie Shanks, MD No address on file PCP: Vernie Shanks, MD (Inactive)  Chief Complaint  Patient presents with   Right Shoulder - Pain      HPI: Patient is a pleasant active 76 year old gentleman with a 6-week history of right shoulder pain.  He is 20 years status post rotator cuff repair acromioplasty with Dr. Mayer Camel.  He is also had rotator cuff surgery on his left shoulder.  He says he enjoys golf and was playing golf about 5 weeks ago when he grounded his club.  A few hours later he noticed deep pain in the shoulder.  He is concerned because he felt like he felt when he tore his rotator cuff.  He does not have any complaints with loss of motion.  He does have some pain with cross body adduction and external rotation above his head.  He denies any loss of strength and does perform shoulder strengthening exercises 5 out of 7 days a week.  Some trouble sleeping on that side  Assessment & Plan: Visit Diagnoses:  1. Right shoulder pain, unspecified chronicity   2. Acute pain of right shoulder     Plan: Patient has good strength and motion today.  He does have a little bit of impingement findings.  He has good biceps strength.  May have a partial retear of the rotator cuff difficult to say without an MRI.  We offered him an MRI today or alternatively he could try an injection today and if he got no better could call and we would order an MRI.  He would like to try an injection first.  He has been faithful about doing his exercises since his surgery and is going to could do that.  We asked that he refrain from golf for a week after the injection if he can  Follow-Up Instructions: If fails to improve will call for MRI  Ortho Exam  Patient is alert, oriented, no adenopathy, well-dressed, normal affect, normal respiratory  effort. Examination of his right shoulder well-healed surgical incisions.  He has equal motion with overhead extension of his arm internal rotation behind his back abduction.  His strength is 5 out of 5 with internal rotation external rotation resisted abduction.  He does have some mild impingement findings.  Imaging: XR Shoulder Right  Result Date: 02/15/2022 Radiographs of his right shoulder demonstrate findings consistent with previous rotator cuff repair with 2 metal anchors in the humeral head.  No evidence of movement of these anchors.  He does have a downsloping acromion.  No acute bony changes little early sclerotic changes humeral head is well reduced in the glenoid fossa .  Has about 6-7 millimeters between the greater tuberosity and the lateral acromion No images are attached to the encounter.  Labs: Lab Results  Component Value Date   HGBA1C 5.5 05/24/2017   REPTSTATUS 11/02/2009 FINAL 10/31/2009   CULT NO GROWTH 10/31/2009     Lab Results  Component Value Date   ALBUMIN 4.4 10/13/2021   ALBUMIN 4.3 04/14/2021   ALBUMIN 4.5 10/12/2020    No results found for: "MG" No results found for: "VD25OH"  No results found for: "PREALBUMIN"  Latest Ref Rng & Units 10/13/2021    8:15 AM 04/14/2021    8:23 AM 10/12/2020    7:56 AM  CBC EXTENDED  WBC 4.0 - 10.5 K/uL 6.2  6.2  5.9   RBC 4.22 - 5.81 MIL/uL 4.28  4.08  4.20   Hemoglobin 13.0 - 17.0 g/dL 13.3  12.6  13.1   HCT 39.0 - 52.0 % 37.6  36.6  37.7   Platelets 150 - 400 K/uL 159  154  144   NEUT# 1.7 - 7.7 K/uL 4.0  4.0  3.6   Lymph# 0.7 - 4.0 K/uL 1.4  1.4  1.5      There is no height or weight on file to calculate BMI.  Orders:  Orders Placed This Encounter  Procedures   Large Joint Inj: R subacromial bursa   XR Shoulder Right   No orders of the defined types were placed in this encounter.    Procedures: Large Joint Inj: R subacromial bursa on 02/15/2022 10:09 AM Indications: pain and diagnostic  evaluation Details: 25 G 1.5 in needle, anterior approach  Arthrogram: No  Medications: 80 mg methylPREDNISolone acetate 40 MG/ML; 2 mL lidocaine 1 %; 2 mL bupivacaine 0.25 % Outcome: tolerated well, no immediate complications Procedure, treatment alternatives, risks and benefits explained, specific risks discussed. Consent was given by the patient.      Clinical Data: No additional findings.  ROS:  All other systems negative, except as noted in the HPI. Review of Systems  Objective: Vital Signs: There were no vitals taken for this visit.  Specialty Comments:  No specialty comments available.  PMFS History: Patient Active Problem List   Diagnosis Date Noted   Pain in right shoulder 02/15/2022   Solitary pulmonary nodule 11/07/2018   Lung mass 08/08/2018   Counseling regarding goals of care 06/18/2017   Cancer of sigmoid colon ([place]) 05/30/2017   Past Medical History:  Diagnosis Date   Anemia    as a child   Cancer of sigmoid colon ([place]) 05/30/2017   Counseling regarding goals of care 06/18/2017   History of kidney stones    Pneumonia    as a child    No family history on file.  Past Surgical History:  Procedure Laterality Date   CARDIAC CATHETERIZATION     at age 84-negative   cyctoscopy     for stones   EXTRACORPOREAL SHOCK WAVE LITHOTRIPSY Right 06/05/2019   Procedure: EXTRACORPOREAL SHOCK WAVE LITHOTRIPSY (ESWL);  Surgeon: Raynelle Bring, MD;  Location: [facility];  Service: Urology;  Laterality: Right;   HERNIA REPAIR     r and left inguinal  30 years ago   PARTIAL COLECTOMY Left 05/30/2017   Procedure: LAPAROSCOPIC ASSISTED LEFT COLECTOMY;  Surgeon: Fanny Skates, MD;  Location: [facility];  Service: General;  Laterality: Left;  GENERAL AND TAP BLOCK    PORTACATH PLACEMENT Right 06/11/2017   Procedure: INSERTION PORT-A-CATH;  Surgeon: Fanny Skates, MD;  Location: [facility];  Service: General;  Laterality: Right;   SHOULDER SURGERY     Bil    VIDEO ASSISTED THORACOSCOPY (VATS)/WEDGE RESECTION Left 08/08/2018   Procedure: VIDEO ASSISTED THORACOSCOPY (VATS)/WEDGE RESECTION LEFT LOWER LOBE, LYMPH NODE DISSECTION;  Surgeon: [name], MD;  Location: [facility];  Service: Thoracic;  Laterality: Left;   Social History   Occupational History   Not on file  Tobacco Use   Smoking status: Never   Smokeless tobacco: Never  Vaping Use   Vaping Use: Never  used  Substance and Sexual Activity   Alcohol use: Yes    Comment: occasional   Drug use: No   Sexual activity: Yes

## 2022-04-14 ENCOUNTER — Other Ambulatory Visit: Payer: Self-pay

## 2022-04-14 ENCOUNTER — Inpatient Hospital Stay (HOSPITAL_BASED_OUTPATIENT_CLINIC_OR_DEPARTMENT_OTHER): Payer: Medicare Other | Admitting: Hematology & Oncology

## 2022-04-14 ENCOUNTER — Encounter (HOSPITAL_BASED_OUTPATIENT_CLINIC_OR_DEPARTMENT_OTHER): Payer: Self-pay

## 2022-04-14 ENCOUNTER — Telehealth: Payer: Self-pay | Admitting: *Deleted

## 2022-04-14 ENCOUNTER — Ambulatory Visit (HOSPITAL_BASED_OUTPATIENT_CLINIC_OR_DEPARTMENT_OTHER)
Admission: RE | Admit: 2022-04-14 | Discharge: 2022-04-14 | Disposition: A | Discharge status: Home / Self Care | Payer: Medicare Other | Source: Ambulatory Visit | Attending: Hematology & Oncology | Admitting: Hematology & Oncology

## 2022-04-14 ENCOUNTER — Encounter: Payer: Self-pay | Admitting: Hematology & Oncology

## 2022-04-14 ENCOUNTER — Inpatient Hospital Stay: Payer: Medicare Other | Attending: Hematology & Oncology

## 2022-04-14 VITALS — BP 132/74 | HR 90 | Temp 98.1°F | Resp 18 | Ht 68.0 in | Wt 160.8 lb

## 2022-04-14 DIAGNOSIS — C7800 Secondary malignant neoplasm of unspecified lung: Secondary | ICD-10-CM | POA: Insufficient documentation

## 2022-04-14 DIAGNOSIS — C187 Malignant neoplasm of sigmoid colon: Secondary | ICD-10-CM

## 2022-04-14 DIAGNOSIS — R911 Solitary pulmonary nodule: Secondary | ICD-10-CM | POA: Diagnosis not present

## 2022-04-14 DIAGNOSIS — Z79899 Other long term (current) drug therapy: Secondary | ICD-10-CM | POA: Diagnosis not present

## 2022-04-14 DIAGNOSIS — I2510 Atherosclerotic heart disease of native coronary artery without angina pectoris: Secondary | ICD-10-CM | POA: Diagnosis not present

## 2022-04-14 DIAGNOSIS — I700 Atherosclerosis of aorta: Secondary | ICD-10-CM | POA: Diagnosis not present

## 2022-04-14 DIAGNOSIS — N200 Calculus of kidney: Secondary | ICD-10-CM | POA: Diagnosis not present

## 2022-04-14 DIAGNOSIS — N3289 Other specified disorders of bladder: Secondary | ICD-10-CM | POA: Diagnosis not present

## 2022-04-14 DIAGNOSIS — N400 Enlarged prostate without lower urinary tract symptoms: Secondary | ICD-10-CM | POA: Diagnosis not present

## 2022-04-14 DIAGNOSIS — Z85038 Personal history of other malignant neoplasm of large intestine: Secondary | ICD-10-CM | POA: Diagnosis not present

## 2022-04-14 LAB — CMP (CANCER CENTER ONLY)
ALT: 24 U/L (ref 0–44)
AST: 29 U/L (ref 15–41)
Albumin: 4.7 g/dL (ref 3.5–5.0)
Alkaline Phosphatase: 73 U/L (ref 38–126)
Anion gap: 5 (ref 5–15)
BUN: 25 mg/dL — ABNORMAL HIGH (ref 8–23)
CO2: 33 mmol/L — ABNORMAL HIGH (ref 22–32)
Calcium: 10.3 mg/dL (ref 8.9–10.3)
Chloride: 100 mmol/L (ref 98–111)
Creatinine: 1.39 mg/dL — ABNORMAL HIGH (ref 0.61–1.24)
GFR, Estimated: 53 mL/min — ABNORMAL LOW (ref >60)
Glucose, Bld: 104 mg/dL — ABNORMAL HIGH (ref 70–99)
Potassium: 5.5 mmol/L — ABNORMAL HIGH (ref 3.5–5.1)
Sodium: 138 mmol/L (ref 135–145)
Total Bilirubin: 1 mg/dL (ref 0.3–1.2)
Total Protein: 7.3 g/dL (ref 6.5–8.1)

## 2022-04-14 LAB — CBC WITH DIFFERENTIAL (CANCER CENTER ONLY)
Abs Immature Granulocytes: 0.04 10*3/uL (ref 0.00–0.07)
Basophils Absolute: 0 10*3/uL (ref 0.0–0.1)
Basophils Relative: 0 %
Eosinophils Absolute: 0.1 10*3/uL (ref 0.0–0.5)
Eosinophils Relative: 2 %
HCT: 40.4 % (ref 39.0–52.0)
Hemoglobin: 13.7 g/dL (ref 13.0–17.0)
Immature Granulocytes: 1 %
Lymphocytes Relative: 22 %
Lymphs Abs: 1.6 10*3/uL (ref 0.7–4.0)
MCH: 30.7 pg (ref 26.0–34.0)
MCHC: 33.9 g/dL (ref 30.0–36.0)
MCV: 90.6 fL (ref 80.0–100.0)
Monocytes Absolute: 0.7 10*3/uL (ref 0.1–1.0)
Monocytes Relative: 10 %
Neutro Abs: 4.5 10*3/uL (ref 1.7–7.7)
Neutrophils Relative %: 65 %
Platelet Count: 162 10*3/uL (ref 150–400)
RBC: 4.46 MIL/uL (ref 4.22–5.81)
RDW: 12.3 % (ref 11.5–15.5)
WBC Count: 7 10*3/uL (ref 4.0–10.5)
nRBC: 0 % (ref 0.0–0.2)

## 2022-04-14 LAB — LACTATE DEHYDROGENASE: LDH: 152 U/L (ref 98–192)

## 2022-04-14 LAB — CEA (IN HOUSE-CHCC): CEA (CHCC-In House): 2.56 ng/mL (ref 0.00–5.00)

## 2022-04-14 MED ORDER — IOHEXOL 300 MG/ML  SOLN
100.0000 mL | Freq: Once | INTRAMUSCULAR | Status: AC | PRN
  Administered 2022-04-14: 100 mL via INTRAVENOUS

## 2022-04-14 NOTE — Progress Notes (Signed)
Hematology and Oncology Follow Up Visit  Jack Gray [id number] 08-Nov-1945 76 y.o. 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  We saw him 6 months ago.  He actually did have a colonoscopy.  I think this was back in May.  It sounds like he had a polyp this was removed.  He needs to have another colonoscopy for follow-up in November or December.  He did have a CT scan done today.  Thankfully, the CT scan did not show any evidence of recurrent cancer.  He had a CEA when we last saw him.  This was 2.2.  He has had no problems with nausea or vomiting.  There is been no change in bowel or bladder habits.  He has had no leg swelling.  He and his wife have been quite active.  Thankfully, her back pain is no longer present.  She has had no cough.  He says that when he rolls over onto his left side, he does have little bit of discomfort.  This is the side that he did have a lung met resected.  As such, he may have little scar tissue a little bit of pleural fibrosis.  Currently, I would say his performance status is probably ECOG 0.    Medications:  Current Outpatient Medications:    atorvastatin (LIPITOR) 20 MG tablet, Take 20 mg by mouth every evening. , Disp: , Rfl:    b complex vitamins capsule, Take 1 capsule by mouth daily., Disp: , Rfl:    Cholecalciferol (VITAMIN D) 2000 units CAPS, Take 2,000 Units by mouth daily., Disp: , Rfl:    Glucosamine HCl (GLUCOSAMINE PO), Take 1 tablet by mouth 2 (two) times daily., Disp: , Rfl:    Multiple Vitamins-Minerals (PRESERVISION AREDS PO), Take 1 capsule by mouth 2 (two) times daily. , Disp: , Rfl:   Allergies:  No Known Allergies  Past Medical History, Surgical history, Social history, and Family History were reviewed and updated.  Review of Systems: Review of Systems  Constitutional:  Negative for appetite change, fatigue, fever and unexpected weight change.  HENT:   Negative for lump/mass, mouth sores, sore throat and trouble swallowing.   Respiratory:  Negative for cough, hemoptysis and shortness of breath.   Cardiovascular:  Negative for leg swelling and palpitations.  Gastrointestinal:  Negative for abdominal distention, abdominal pain, blood in stool, constipation, diarrhea, nausea and vomiting.  Genitourinary:  Negative for bladder incontinence, dysuria, frequency and hematuria.   Musculoskeletal:  Negative for arthralgias, back pain, gait problem and myalgias.  Skin:  Negative for itching and rash.  Neurological:  Negative for dizziness, extremity weakness, gait problem, headaches, numbness, seizures and speech difficulty.  Hematological:  Does not bruise/bleed easily.  Psychiatric/Behavioral:  Negative for depression and sleep disturbance. The patient is not nervous/anxious.     Physical Exam:  height is _0  (1.727 m) and weight is 160 lb 12.8 oz (72.9 kg). His oral temperature is 98.1 F (36.7 C). His blood pressure is 132/74 and his pulse is 90. His respiration is 18 and oxygen saturation is 100%.   Wt Readings from Last  3 Encounters:  04/14/22 160 lb 12.8 oz (72.9 kg)  10/13/21 164 lb 1.1 oz (74.4 kg)  04/14/21 162 lb 12.8 oz (73.8 kg)    Physical Exam Vitals reviewed.  HENT:     Head: Normocephalic and atraumatic.  Eyes:     Pupils: Pupils are equal, round, and reactive to light.  Cardiovascular:     Rate and Rhythm: Normal rate and regular rhythm.     Heart sounds: Normal heart sounds.  Pulmonary:     Effort: Pulmonary effort is normal.     Breath sounds: Normal breath sounds.  Abdominal:     General: Bowel sounds are normal.     Palpations: Abdomen is soft.   Musculoskeletal:        General: No tenderness or deformity. Normal range of motion.     Cervical back: Normal range of motion.  Lymphadenopathy:     Cervical: No cervical adenopathy.  Skin:    General: Skin is warm and dry.     Findings: No erythema or rash.  Neurological:     Mental Status: He is alert and oriented to person, place, and time.  Psychiatric:        Behavior: Behavior normal.        Thought Content: Thought content normal.        Judgment: Judgment normal.      Lab Results  Component Value Date   WBC 7.0 04/14/2022   HGB 13.7 04/14/2022   HCT 40.4 04/14/2022   MCV 90.6 04/14/2022   PLT 162 04/14/2022     Chemistry      Component Value Date/Time   NA 138 04/14/2022 0853   NA 141 08/08/2017 0802   K 5.5 (H) 04/14/2022 0853   K 3.7 08/08/2017 0802   CL 100 04/14/2022 0853   CL 105 08/08/2017 0802   CO2 33 (H) 04/14/2022 0853   CO2 29 08/08/2017 0802   BUN 25 (H) 04/14/2022 0853   BUN 15 08/08/2017 0802   CREATININE 1.39 (H) 04/14/2022 0853   CREATININE 1.1 08/08/2017 0802      Component Value Date/Time   CALCIUM 10.3 04/14/2022 0853   CALCIUM 9.2 08/08/2017 0802   ALKPHOS 73 04/14/2022 0853   ALKPHOS 86 (H) 08/08/2017 0802   AST 29 04/14/2022 0853   ALT 24 04/14/2022 0853   ALT 64 (H) 08/08/2017 0802   BILITOT 1.0 04/14/2022 0853       Impression and Plan: Jack Gray is a 76 year old white male.  He had a stage IIIb adenocarcinoma of the sigmoid colon.  He had this resected.  Even though he had negative lymph nodes, he had 2 nodules which may have been lymph nodes.  He received adjuvant chemotherapy with FOLFOX.  He then had this pulmonary recurrence which was fairly early after completing the FOLFOX chemotherapy.  Thankfully, he had a solitary recurrence.  He had this resected.  He then had a second recurrence.  This was also in the lung.  He completed SBRT in April, 2020.  I will have to see what his CAT scans look like.  We still need  to follow along with scans.  He obviously is at risk for another recurrence.  However, I think we now go 6 months with our next scan.    Lattie Haw, MD

## 2022-04-14 NOTE — Telephone Encounter (Signed)
Per 04/14/22 los - gave upcoming appointments - confirmed

## 2022-05-01 ENCOUNTER — Telehealth: Payer: Self-pay | Admitting: Orthopaedic Surgery

## 2022-05-01 ENCOUNTER — Other Ambulatory Visit: Payer: Self-pay | Admitting: Physician Assistant

## 2022-05-01 DIAGNOSIS — M25511 Pain in right shoulder: Secondary | ICD-10-CM

## 2022-05-01 NOTE — Telephone Encounter (Signed)
Patient states that dr Tanda Rockers said if the shot in your did not do any good for him to call back to get MRI.  [phone]

## 2022-05-11 DIAGNOSIS — C187 Malignant neoplasm of sigmoid colon: Secondary | ICD-10-CM | POA: Diagnosis not present

## 2022-05-11 DIAGNOSIS — C7801 Secondary malignant neoplasm of right lung: Secondary | ICD-10-CM | POA: Diagnosis not present

## 2022-05-11 DIAGNOSIS — M75101 Unspecified rotator cuff tear or rupture of right shoulder, not specified as traumatic: Secondary | ICD-10-CM | POA: Diagnosis not present

## 2022-05-11 DIAGNOSIS — E782 Mixed hyperlipidemia: Secondary | ICD-10-CM | POA: Diagnosis not present

## 2022-05-11 DIAGNOSIS — Z6825 Body mass index (BMI) 25.0-25.9, adult: Secondary | ICD-10-CM | POA: Diagnosis not present

## 2022-05-11 DIAGNOSIS — Z23 Encounter for immunization: Secondary | ICD-10-CM | POA: Diagnosis not present

## 2022-05-11 DIAGNOSIS — I700 Atherosclerosis of aorta: Secondary | ICD-10-CM | POA: Diagnosis not present

## 2022-05-15 ENCOUNTER — Ambulatory Visit
Admission: RE | Admit: 2022-05-15 | Discharge: 2022-05-15 | Disposition: A | Discharge status: Home / Self Care | Payer: Medicare Other | Source: Ambulatory Visit | Attending: Physician Assistant | Admitting: Physician Assistant

## 2022-05-15 DIAGNOSIS — M25511 Pain in right shoulder: Secondary | ICD-10-CM | POA: Diagnosis not present

## 2022-05-17 ENCOUNTER — Ambulatory Visit (INDEPENDENT_AMBULATORY_CARE_PROVIDER_SITE_OTHER): Payer: Medicare Other | Admitting: Orthopaedic Surgery

## 2022-05-17 ENCOUNTER — Encounter: Payer: Self-pay | Admitting: Orthopaedic Surgery

## 2022-05-17 DIAGNOSIS — M25511 Pain in right shoulder: Secondary | ICD-10-CM

## 2022-05-17 NOTE — Progress Notes (Signed)
Office Visit Note   Patient: Jack Gray           Date of Birth: 07-Nov-1945           MRN: [id number] Visit Date: 05/17/2022              Requested by: No referring provider defined for this encounter. PCP: Jack Shanks, MD (Inactive)   Assessment & Plan: Visit Diagnoses:  1. Right shoulder pain, unspecified chronicity     Plan: Jack Gray comes in today for review of his MRI of his right shoulder.  He has a history of rotator cuff repair on the right shoulder 20 years ago with an acromioplasty done elsewhere.  He was doing well but grounded his club while playing golf several weeks ago.  Last visit he did have an injection into the subacromial space.  He did get good relief from this but an MRI was ordered to rule out reinjury to his rotator cuff.  The MRI demonstrated no full-thickness tear of the rotator cuff and repair was intact.  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  He really does not have a lot of symptoms right now except when he reaches behind him or he has some soreness after a day of golf.  He also does daily exercises for his shoulder.  Encouraged him to continue to do this.  He may follow-up as needed if he would like another cortisone shot.  All questions were answered  Follow-Up Instructions: Return if symptoms worsen or fail to improve.   Orders:  No orders of the defined types were placed in this encounter.  No orders of the defined types were placed in this encounter.     Procedures: No procedures performed   Clinical Data: No additional findings.   Subjective: Chief Complaint  Patient presents with   Right Shoulder - Pain, Follow-up    HPI patient is a pleasant 76 year old gentleman who comes in today for an MRI review of his right shoulder.  He is 28 years status post rotator cuff repair acromioplasty done elsewhere.  He had a reinjury to the shoulder when he would ground at a golf club several weeks ago.  He did  have an injection which helped him somewhat  Review of Systems  All other systems reviewed and are negative.    Objective: Vital Signs: There were no vitals taken for this visit.  Physical Exam Constitutional:      Appearance: Normal appearance.  Pulmonary:     Effort: Pulmonary effort is normal.  Skin:    General: Skin is warm and dry.  Neurological:     Mental Status: He is alert.     Ortho Exam Right shoulder he is neurovascularly intact.  He has full active forward elevation.  He does have some tightness with external rotation.  Mild tenderness over the [facility] joint.  Strength is intact Specialty Comments:  No specialty comments available.  Imaging: No results found.   PMFS History: Patient Active Problem List   Diagnosis Date Noted   Pain in right shoulder 02/15/2022   Solitary pulmonary nodule 11/07/2018   Lung mass 08/08/2018   Counseling regarding goals of care 06/18/2017   Cancer of sigmoid colon ([place]) 05/30/2017   Past Medical History:  Diagnosis Date   Anemia    as a child   Cancer of sigmoid colon ([place]) 05/30/2017   Counseling regarding goals of care 06/18/2017   History  of kidney stones    Pneumonia    as a child    History reviewed. No pertinent family history.  Past Surgical History:  Procedure Laterality Date   CARDIAC CATHETERIZATION     at age 26-negative   cyctoscopy     for stones   EXTRACORPOREAL SHOCK WAVE LITHOTRIPSY Right 06/05/2019   Procedure: EXTRACORPOREAL SHOCK WAVE LITHOTRIPSY (ESWL);  Surgeon: Raynelle Bring, MD;  Location: [facility];  Service: Urology;  Laterality: Right;   HERNIA REPAIR     r and left inguinal  30 years ago   PARTIAL COLECTOMY Left 05/30/2017   Procedure: LAPAROSCOPIC ASSISTED LEFT COLECTOMY;  Surgeon: Fanny Skates, MD;  Location: [facility];  Service: General;  Laterality: Left;  GENERAL AND TAP BLOCK    PORTACATH PLACEMENT Right 06/11/2017   Procedure: INSERTION PORT-A-CATH;  Surgeon: Fanny Skates, MD;   Location: [facility];  Service: General;  Laterality: Right;   SHOULDER SURGERY     Bil   VIDEO ASSISTED THORACOSCOPY (VATS)/WEDGE RESECTION Left 08/08/2018   Procedure: VIDEO ASSISTED THORACOSCOPY (VATS)/WEDGE RESECTION LEFT LOWER LOBE, LYMPH NODE DISSECTION;  Surgeon: [name], MD;  Location: [facility];  Service: Thoracic;  Laterality: Left;   Social History   Occupational History   Not on file  Tobacco Use   Smoking status: Never   Smokeless tobacco: Never  Vaping Use   Vaping Use: Never used  Substance and Sexual Activity   Alcohol use: Yes    Comment: occasional   Drug use: No   Sexual activity: Yes

## 2022-05-26 ENCOUNTER — Other Ambulatory Visit (HOSPITAL_BASED_OUTPATIENT_CLINIC_OR_DEPARTMENT_OTHER): Payer: Self-pay

## 2022-05-26 DIAGNOSIS — Z23 Encounter for immunization: Secondary | ICD-10-CM | POA: Diagnosis not present

## 2022-05-26 MED ORDER — COMIRNATY 30 MCG/0.3ML IM SUSY
PREFILLED_SYRINGE | INTRAMUSCULAR | 0 refills | Status: DC
  Filled 2022-05-26: qty 0.3, 1d supply, fill #0

## 2022-06-21 DIAGNOSIS — K648 Other hemorrhoids: Secondary | ICD-10-CM | POA: Diagnosis not present

## 2022-06-21 DIAGNOSIS — Z980 Intestinal bypass and anastomosis status: Secondary | ICD-10-CM | POA: Diagnosis not present

## 2022-06-21 DIAGNOSIS — Z08 Encounter for follow-up examination after completed treatment for malignant neoplasm: Secondary | ICD-10-CM | POA: Diagnosis not present

## 2022-06-21 DIAGNOSIS — K5730 Diverticulosis of large intestine without perforation or abscess without bleeding: Secondary | ICD-10-CM | POA: Diagnosis not present

## 2022-06-21 DIAGNOSIS — Z85038 Personal history of other malignant neoplasm of large intestine: Secondary | ICD-10-CM | POA: Diagnosis not present

## 2022-06-21 DIAGNOSIS — D124 Benign neoplasm of descending colon: Secondary | ICD-10-CM | POA: Diagnosis not present

## 2022-06-21 DIAGNOSIS — D123 Benign neoplasm of transverse colon: Secondary | ICD-10-CM | POA: Diagnosis not present

## 2022-06-22 DIAGNOSIS — N200 Calculus of kidney: Secondary | ICD-10-CM | POA: Diagnosis not present

## 2022-06-22 DIAGNOSIS — R351 Nocturia: Secondary | ICD-10-CM | POA: Diagnosis not present

## 2022-06-22 DIAGNOSIS — H353131 Nonexudative age-related macular degeneration, bilateral, early dry stage: Secondary | ICD-10-CM | POA: Diagnosis not present

## 2022-06-23 DIAGNOSIS — D123 Benign neoplasm of transverse colon: Secondary | ICD-10-CM | POA: Diagnosis not present

## 2022-06-23 DIAGNOSIS — D124 Benign neoplasm of descending colon: Secondary | ICD-10-CM | POA: Diagnosis not present

## 2022-08-31 DIAGNOSIS — Z0000 Encounter for general adult medical examination without abnormal findings: Secondary | ICD-10-CM | POA: Diagnosis not present

## 2022-08-31 DIAGNOSIS — Z1389 Encounter for screening for other disorder: Secondary | ICD-10-CM | POA: Diagnosis not present

## 2022-08-31 DIAGNOSIS — Z6825 Body mass index (BMI) 25.0-25.9, adult: Secondary | ICD-10-CM | POA: Diagnosis not present

## 2022-10-13 ENCOUNTER — Other Ambulatory Visit: Payer: Medicare Other

## 2022-10-13 ENCOUNTER — Encounter: Payer: Self-pay | Admitting: Hematology & Oncology

## 2022-10-13 ENCOUNTER — Encounter: Payer: Self-pay | Admitting: *Deleted

## 2022-10-13 ENCOUNTER — Ambulatory Visit (HOSPITAL_BASED_OUTPATIENT_CLINIC_OR_DEPARTMENT_OTHER)
Admission: RE | Admit: 2022-10-13 | Discharge: 2022-10-13 | Disposition: A | Discharge status: Home / Self Care | Payer: Medicare Other | Source: Ambulatory Visit | Attending: Hematology & Oncology | Admitting: Hematology & Oncology

## 2022-10-13 ENCOUNTER — Other Ambulatory Visit: Payer: Self-pay

## 2022-10-13 ENCOUNTER — Inpatient Hospital Stay (HOSPITAL_BASED_OUTPATIENT_CLINIC_OR_DEPARTMENT_OTHER): Payer: Medicare Other | Admitting: Hematology & Oncology

## 2022-10-13 ENCOUNTER — Encounter (HOSPITAL_BASED_OUTPATIENT_CLINIC_OR_DEPARTMENT_OTHER): Payer: Self-pay

## 2022-10-13 ENCOUNTER — Ambulatory Visit: Payer: Medicare Other | Admitting: Hematology & Oncology

## 2022-10-13 ENCOUNTER — Inpatient Hospital Stay: Payer: Medicare Other | Attending: Hematology & Oncology

## 2022-10-13 VITALS — BP 134/89 | HR 72 | Temp 97.8°F | Resp 18 | Ht 68.0 in | Wt 164.0 lb

## 2022-10-13 DIAGNOSIS — C7800 Secondary malignant neoplasm of unspecified lung: Secondary | ICD-10-CM | POA: Insufficient documentation

## 2022-10-13 DIAGNOSIS — J9811 Atelectasis: Secondary | ICD-10-CM | POA: Diagnosis not present

## 2022-10-13 DIAGNOSIS — Z79899 Other long term (current) drug therapy: Secondary | ICD-10-CM | POA: Diagnosis not present

## 2022-10-13 DIAGNOSIS — Z923 Personal history of irradiation: Secondary | ICD-10-CM | POA: Insufficient documentation

## 2022-10-13 DIAGNOSIS — K760 Fatty (change of) liver, not elsewhere classified: Secondary | ICD-10-CM | POA: Diagnosis not present

## 2022-10-13 DIAGNOSIS — N200 Calculus of kidney: Secondary | ICD-10-CM | POA: Diagnosis not present

## 2022-10-13 DIAGNOSIS — C179 Malignant neoplasm of small intestine, unspecified: Secondary | ICD-10-CM | POA: Diagnosis not present

## 2022-10-13 DIAGNOSIS — C187 Malignant neoplasm of sigmoid colon: Secondary | ICD-10-CM | POA: Insufficient documentation

## 2022-10-13 DIAGNOSIS — J929 Pleural plaque without asbestos: Secondary | ICD-10-CM | POA: Diagnosis not present

## 2022-10-13 LAB — CMP (CANCER CENTER ONLY)
ALT: 21 U/L (ref 0–44)
AST: 26 U/L (ref 15–41)
Albumin: 4.8 g/dL (ref 3.5–5.0)
Alkaline Phosphatase: 64 U/L (ref 38–126)
Anion gap: 7 (ref 5–15)
BUN: 25 mg/dL — ABNORMAL HIGH (ref 8–23)
CO2: 32 mmol/L (ref 22–32)
Calcium: 10.4 mg/dL — ABNORMAL HIGH (ref 8.9–10.3)
Chloride: 101 mmol/L (ref 98–111)
Creatinine: 1.42 mg/dL — ABNORMAL HIGH (ref 0.61–1.24)
GFR, Estimated: 51 mL/min — ABNORMAL LOW (ref >60)
Glucose, Bld: 115 mg/dL — ABNORMAL HIGH (ref 70–99)
Potassium: 4.6 mmol/L (ref 3.5–5.1)
Sodium: 140 mmol/L (ref 135–145)
Total Bilirubin: 0.7 mg/dL (ref 0.3–1.2)
Total Protein: 7.3 g/dL (ref 6.5–8.1)

## 2022-10-13 LAB — CBC WITH DIFFERENTIAL (CANCER CENTER ONLY)
Abs Immature Granulocytes: 0.03 10*3/uL (ref 0.00–0.07)
Basophils Absolute: 0 10*3/uL (ref 0.0–0.1)
Basophils Relative: 1 %
Eosinophils Absolute: 0.2 10*3/uL (ref 0.0–0.5)
Eosinophils Relative: 3 %
HCT: 39 % (ref 39.0–52.0)
Hemoglobin: 13.2 g/dL (ref 13.0–17.0)
Immature Granulocytes: 1 %
Lymphocytes Relative: 26 %
Lymphs Abs: 1.7 10*3/uL (ref 0.7–4.0)
MCH: 30.6 pg (ref 26.0–34.0)
MCHC: 33.8 g/dL (ref 30.0–36.0)
MCV: 90.5 fL (ref 80.0–100.0)
Monocytes Absolute: 0.7 10*3/uL (ref 0.1–1.0)
Monocytes Relative: 11 %
Neutro Abs: 3.8 10*3/uL (ref 1.7–7.7)
Neutrophils Relative %: 58 %
Platelet Count: 161 10*3/uL (ref 150–400)
RBC: 4.31 MIL/uL (ref 4.22–5.81)
RDW: 12.2 % (ref 11.5–15.5)
WBC Count: 6.4 10*3/uL (ref 4.0–10.5)
nRBC: 0 % (ref 0.0–0.2)

## 2022-10-13 LAB — CEA (IN HOUSE-CHCC): CEA (CHCC-In House): 2.72 ng/mL (ref 0.00–5.00)

## 2022-10-13 MED ORDER — IOHEXOL 300 MG/ML  SOLN
100.0000 mL | Freq: Once | INTRAMUSCULAR | Status: AC | PRN
  Administered 2022-10-13: 100 mL via INTRAVENOUS

## 2022-10-13 NOTE — Progress Notes (Signed)
Hematology and Oncology Follow Up Visit  DRAYCE HOLLINGS [id number] 1946/04/09 77 y.o. 10/13/2022   Principle Diagnosis:  Stage IIIB (T3N1cM0) adenocarcinoma of the sigmoid colon -- BRAF (+)  -- metastatic to lung  Current Therapy:   Status post cycle #8 of adjuvant FOLFOX (oxaliplatin d/c'ed w/  cycle #6) - completed on 10/03/2017 Resection of lung met -- solitary -- on 08/08/2018 SBRT -- RIGHT lung met -- 11/2018     Interim History:  Mr. Murphy is back for follow-up.  We saw him 6 months ago.  He comes in with his wife.  I have not seen her for quite a while.  She is doing okay.  As always, they travel.  They just got back from [place].  He did have a CT scan done today.  The CT scan did not show any evidence of recurrent colon cancer.  I think is now been about 4 years that he had treatment for the recurrence.  He had SBRT.  When we last saw him, his CEA level was 2.56.  He has had no problems with bowels or bladder.  I have noted that his creatinine is slowly going up.  I told him to make sure he hydrates well.  He may need to see his family doctor about this.  He has had no probably COVID.  He has had no cough or shortness of breath.  He has had no nausea or vomiting.  There has been no bleeding.  He has had no rashes.  There is been no leg swelling.  Overall, I would say that his performance status is probably ECOG 0.      Medications:  Current Outpatient Medications:    BISACODYL 5 MG EC tablet, Take 5 mg by mouth as directed., Disp: , Rfl:    tamsulosin (FLOMAX) 0.4 MG CAPS capsule, Take 0.4 mg by mouth daily., Disp: , Rfl:    atorvastatin (LIPITOR) 20 MG tablet, Take 20 mg by mouth every evening. , Disp: , Rfl:    b complex vitamins capsule, Take 1 capsule by mouth daily., Disp: , Rfl:    Cholecalciferol (VITAMIN D) 2000 units CAPS, Take 2,000 Units by mouth daily., Disp: , Rfl:    COVID-19 mRNA vaccine 2023-2024 (COMIRNATY) syringe, Inject into the muscle., Disp: 0.3 mL,  Rfl: 0   Glucosamine HCl (GLUCOSAMINE PO), Take 1 tablet by mouth 2 (two) times daily., Disp: , Rfl:    Multiple Vitamins-Minerals (PRESERVISION AREDS PO), Take 1 capsule by mouth 2 (two) times daily. , Disp: , Rfl:   Allergies: No Known Allergies  Past Medical History, Surgical history, Social history, and Family History were reviewed and updated.  Review of Systems: Review of Systems  Constitutional:  Negative for appetite change, fatigue, fever and unexpected weight change.  HENT:   Negative for lump/mass, mouth sores, sore throat and trouble swallowing.   Respiratory:  Negative for cough, hemoptysis and shortness of breath.   Cardiovascular:  Negative for leg swelling and palpitations.  Gastrointestinal:  Negative for abdominal distention, abdominal pain, blood in stool, constipation, diarrhea, nausea and vomiting.  Genitourinary:  Negative for bladder incontinence, dysuria, frequency and hematuria.   Musculoskeletal:  Negative for arthralgias, back pain, gait problem and myalgias.  Skin:  Negative for itching and rash.  Neurological:  Negative for dizziness, extremity weakness, gait problem, headaches, numbness, seizures and speech difficulty.  Hematological:  Does not bruise/bleed easily.  Psychiatric/Behavioral:  Negative for depression and sleep disturbance. The patient is not nervous/anxious.  Physical Exam:  height is '5\' 8"'$  (1.727 m) and weight is 164 lb (74.4 kg). His oral temperature is 97.8 F (36.6 C). His blood pressure is 134/89 and his pulse is 72. His respiration is 18 and oxygen saturation is 100%.   Wt Readings from Last 3 Encounters:  10/13/22 164 lb (74.4 kg)  04/14/22 160 lb 12.8 oz (72.9 kg)  10/13/21 164 lb 1.1 oz (74.4 kg)    Physical Exam Vitals reviewed.  HENT:     Head: Normocephalic and atraumatic.  Eyes:     Pupils: Pupils are equal, round, and reactive to light.  Cardiovascular:     Rate and Rhythm: Normal rate and regular rhythm.     Heart  sounds: Normal heart sounds.  Pulmonary:     Effort: Pulmonary effort is normal.     Breath sounds: Normal breath sounds.  Abdominal:     General: Bowel sounds are normal.     Palpations: Abdomen is soft.  Musculoskeletal:        General: No tenderness or deformity. Normal range of motion.     Cervical back: Normal range of motion.  Lymphadenopathy:     Cervical: No cervical adenopathy.  Skin:    General: Skin is warm and dry.     Findings: No erythema or rash.  Neurological:     Mental Status: He is alert and oriented to person, place, and time.  Psychiatric:        Behavior: Behavior normal.        Thought Content: Thought content normal.        Judgment: Judgment normal.      Lab Results  Component Value Date   WBC 6.4 10/13/2022   HGB 13.2 10/13/2022   HCT 39.0 10/13/2022   MCV 90.5 10/13/2022   PLT 161 10/13/2022     Chemistry      Component Value Date/Time   NA 140 10/13/2022 0837   NA 141 08/08/2017 0802   K 4.6 10/13/2022 0837   K 3.7 08/08/2017 0802   CL 101 10/13/2022 0837   CL 105 08/08/2017 0802   CO2 32 10/13/2022 0837   CO2 29 08/08/2017 0802   BUN 25 (H) 10/13/2022 0837   BUN 15 08/08/2017 0802   CREATININE 1.42 (H) 10/13/2022 0837   CREATININE 1.1 08/08/2017 0802      Component Value Date/Time   CALCIUM 10.4 (H) 10/13/2022 0837   CALCIUM 9.2 08/08/2017 0802   ALKPHOS 64 10/13/2022 0837   ALKPHOS 86 (H) 08/08/2017 0802   AST 26 10/13/2022 0837   ALT 21 10/13/2022 0837   ALT 64 (H) 08/08/2017 0802   BILITOT 0.7 10/13/2022 0837       Impression and Plan: Mr. Randles is a 77 year old white male.  He had a stage IIIb adenocarcinoma of the sigmoid colon.  He had this resected.  Even though he had negative lymph nodes, he had 2 nodules which may have been lymph nodes.  He received adjuvant chemotherapy with FOLFOX.  He then had a  pulmonary recurrence which was fairly early after completing the FOLFOX chemotherapy.  Thankfully, he had a solitary  recurrence.  He had this resected.  He then had a second recurrence.  This was also in the lung.  He completed SBRT in April, 2020.  I am happy that the CT scan looks good.  We saw to follow him along.  I still think we had to follow him every 6 months.  We will  do another CT scan when we see him back.     Lattie Haw, MD

## 2022-11-14 DIAGNOSIS — R7989 Other specified abnormal findings of blood chemistry: Secondary | ICD-10-CM | POA: Diagnosis not present

## 2022-11-14 DIAGNOSIS — E782 Mixed hyperlipidemia: Secondary | ICD-10-CM | POA: Diagnosis not present

## 2022-11-14 DIAGNOSIS — R748 Abnormal levels of other serum enzymes: Secondary | ICD-10-CM | POA: Diagnosis not present

## 2022-11-16 DIAGNOSIS — Z6825 Body mass index (BMI) 25.0-25.9, adult: Secondary | ICD-10-CM | POA: Diagnosis not present

## 2022-11-16 DIAGNOSIS — C7802 Secondary malignant neoplasm of left lung: Secondary | ICD-10-CM | POA: Diagnosis not present

## 2022-11-16 DIAGNOSIS — M75101 Unspecified rotator cuff tear or rupture of right shoulder, not specified as traumatic: Secondary | ICD-10-CM | POA: Diagnosis not present

## 2022-11-16 DIAGNOSIS — Z85038 Personal history of other malignant neoplasm of large intestine: Secondary | ICD-10-CM | POA: Diagnosis not present

## 2022-11-16 DIAGNOSIS — E785 Hyperlipidemia, unspecified: Secondary | ICD-10-CM | POA: Diagnosis not present

## 2022-11-16 DIAGNOSIS — N1831 Chronic kidney disease, stage 3a: Secondary | ICD-10-CM | POA: Diagnosis not present

## 2023-01-09 DIAGNOSIS — D2271 Melanocytic nevi of right lower limb, including hip: Secondary | ICD-10-CM | POA: Diagnosis not present

## 2023-01-09 DIAGNOSIS — L820 Inflamed seborrheic keratosis: Secondary | ICD-10-CM | POA: Diagnosis not present

## 2023-01-09 DIAGNOSIS — L812 Freckles: Secondary | ICD-10-CM | POA: Diagnosis not present

## 2023-01-09 DIAGNOSIS — L570 Actinic keratosis: Secondary | ICD-10-CM | POA: Diagnosis not present

## 2023-01-09 DIAGNOSIS — L821 Other seborrheic keratosis: Secondary | ICD-10-CM | POA: Diagnosis not present

## 2023-02-21 DIAGNOSIS — L570 Actinic keratosis: Secondary | ICD-10-CM | POA: Diagnosis not present

## 2023-02-21 DIAGNOSIS — L820 Inflamed seborrheic keratosis: Secondary | ICD-10-CM | POA: Diagnosis not present

## 2023-04-16 ENCOUNTER — Inpatient Hospital Stay: Payer: Medicare Other | Attending: Hematology & Oncology

## 2023-04-16 ENCOUNTER — Inpatient Hospital Stay (HOSPITAL_BASED_OUTPATIENT_CLINIC_OR_DEPARTMENT_OTHER): Payer: Medicare Other | Admitting: Hematology & Oncology

## 2023-04-16 ENCOUNTER — Other Ambulatory Visit: Payer: Self-pay

## 2023-04-16 ENCOUNTER — Encounter: Payer: Self-pay | Admitting: Hematology & Oncology

## 2023-04-16 ENCOUNTER — Encounter (HOSPITAL_BASED_OUTPATIENT_CLINIC_OR_DEPARTMENT_OTHER): Payer: Self-pay

## 2023-04-16 ENCOUNTER — Ambulatory Visit (HOSPITAL_BASED_OUTPATIENT_CLINIC_OR_DEPARTMENT_OTHER)
Admission: RE | Admit: 2023-04-16 | Discharge: 2023-04-16 | Disposition: A | Discharge status: Home / Self Care | Payer: Medicare Other | Source: Ambulatory Visit | Attending: Hematology & Oncology | Admitting: Hematology & Oncology

## 2023-04-16 ENCOUNTER — Ambulatory Visit: Payer: Medicare Other | Admitting: Hematology & Oncology

## 2023-04-16 ENCOUNTER — Inpatient Hospital Stay: Payer: Medicare Other

## 2023-04-16 VITALS — BP 147/78 | HR 68 | Temp 97.6°F | Resp 18 | Ht 68.0 in | Wt 164.0 lb

## 2023-04-16 DIAGNOSIS — I700 Atherosclerosis of aorta: Secondary | ICD-10-CM | POA: Diagnosis not present

## 2023-04-16 DIAGNOSIS — C187 Malignant neoplasm of sigmoid colon: Secondary | ICD-10-CM

## 2023-04-16 DIAGNOSIS — Z79899 Other long term (current) drug therapy: Secondary | ICD-10-CM | POA: Diagnosis not present

## 2023-04-16 DIAGNOSIS — C7801 Secondary malignant neoplasm of right lung: Secondary | ICD-10-CM | POA: Insufficient documentation

## 2023-04-16 DIAGNOSIS — Z9221 Personal history of antineoplastic chemotherapy: Secondary | ICD-10-CM | POA: Diagnosis not present

## 2023-04-16 DIAGNOSIS — K5730 Diverticulosis of large intestine without perforation or abscess without bleeding: Secondary | ICD-10-CM | POA: Diagnosis not present

## 2023-04-16 DIAGNOSIS — C189 Malignant neoplasm of colon, unspecified: Secondary | ICD-10-CM | POA: Diagnosis not present

## 2023-04-16 LAB — CBC WITH DIFFERENTIAL (CANCER CENTER ONLY)
Abs Immature Granulocytes: 0.03 10*3/uL (ref 0.00–0.07)
Basophils Absolute: 0 10*3/uL (ref 0.0–0.1)
Basophils Relative: 0 %
Eosinophils Absolute: 0.1 10*3/uL (ref 0.0–0.5)
Eosinophils Relative: 2 %
HCT: 40.9 % (ref 39.0–52.0)
Hemoglobin: 13.8 g/dL (ref 13.0–17.0)
Immature Granulocytes: 0 %
Lymphocytes Relative: 25 %
Lymphs Abs: 1.7 10*3/uL (ref 0.7–4.0)
MCH: 30.7 pg (ref 26.0–34.0)
MCHC: 33.7 g/dL (ref 30.0–36.0)
MCV: 90.9 fL (ref 80.0–100.0)
Monocytes Absolute: 0.7 10*3/uL (ref 0.1–1.0)
Monocytes Relative: 10 %
Neutro Abs: 4.4 10*3/uL (ref 1.7–7.7)
Neutrophils Relative %: 63 %
Platelet Count: 165 10*3/uL (ref 150–400)
RBC: 4.5 MIL/uL (ref 4.22–5.81)
RDW: 12.1 % (ref 11.5–15.5)
WBC Count: 7 10*3/uL (ref 4.0–10.5)
nRBC: 0 % (ref 0.0–0.2)

## 2023-04-16 LAB — CMP (CANCER CENTER ONLY)
ALT: 19 U/L (ref 0–44)
AST: 26 U/L (ref 15–41)
Albumin: 4.9 g/dL (ref 3.5–5.0)
Alkaline Phosphatase: 70 U/L (ref 38–126)
Anion gap: 6 (ref 5–15)
BUN: 20 mg/dL (ref 8–23)
CO2: 32 mmol/L (ref 22–32)
Calcium: 10.2 mg/dL (ref 8.9–10.3)
Chloride: 102 mmol/L (ref 98–111)
Creatinine: 1.33 mg/dL — ABNORMAL HIGH (ref 0.61–1.24)
GFR, Estimated: 55 mL/min — ABNORMAL LOW (ref >60)
Glucose, Bld: 103 mg/dL — ABNORMAL HIGH (ref 70–99)
Potassium: 4.5 mmol/L (ref 3.5–5.1)
Sodium: 140 mmol/L (ref 135–145)
Total Bilirubin: 0.8 mg/dL (ref 0.3–1.2)
Total Protein: 7.4 g/dL (ref 6.5–8.1)

## 2023-04-16 LAB — LACTATE DEHYDROGENASE: LDH: 147 U/L (ref 98–192)

## 2023-04-16 LAB — CEA (IN HOUSE-CHCC): CEA (CHCC-In House): 2.58 ng/mL (ref 0.00–5.00)

## 2023-04-16 MED ORDER — IOHEXOL 300 MG/ML  SOLN
100.0000 mL | Freq: Once | INTRAMUSCULAR | Status: AC | PRN
  Administered 2023-04-16: 100 mL via INTRAVENOUS

## 2023-04-16 NOTE — Progress Notes (Signed)
Hematology and Oncology Follow Up Visit  Jack Gray [id number] [birth year] 77 y.o. 04/16/2023   Principle Diagnosis:  Stage IIIB (T3N1cM0) adenocarcinoma of the sigmoid colon -- BRAF (+)  -- metastatic to lung  Current Therapy:   Status post cycle #8 of adjuvant FOLFOX (oxaliplatin d/c'ed w/  cycle #6) - completed on 10/03/2017 Resection of lung met -- solitary -- on 08/08/2018 SBRT -- RIGHT lung met -- 11/2018     Interim History:  Jack Gray is back for follow-up.  We see him every 6 months.  Since we last saw him, has been doing pretty well.  As always, he and his wife have been traveling.  The main is that they have another great grandchild.  It is another great grandson.  I am so happy for him.  So happy for his daughter, who I know, for being a grandmother.  He has had no problems with cough or shortness of breath.  He has no change in bowel or bladder habits.  His last tumor marker, which I sees today is 2.6.  He did have a CT scan done today.  The CT scan did not show any evidence of recurrent disease with respect to his colon cancer.  His appetite has been good.  He says he has gained a little bit of weight.  I am not too worried about that.  He has had no bleeding.  There is been no change in bowel or bladder habits.  He has had no leg swelling.  Overall, I would have said that his performance status right now is ECOG 0.   Medications:  Current Outpatient Medications:    atorvastatin (LIPITOR) 20 MG tablet, Take 20 mg by mouth every evening. , Disp: , Rfl:    b complex vitamins capsule, Take 1 capsule by mouth daily., Disp: , Rfl:    Glucosamine HCl (GLUCOSAMINE PO), Take 1 tablet by mouth 2 (two) times daily., Disp: , Rfl:    Multiple Vitamins-Minerals (PRESERVISION AREDS PO), Take 1 capsule by mouth 2 (two) times daily. , Disp: , Rfl:    tamsulosin (FLOMAX) 0.4 MG CAPS capsule, Take 0.4 mg by mouth daily., Disp: , Rfl:   Allergies: No Known Allergies  Past Medical  History, Surgical history, Social history, and Family History were reviewed and updated.  Review of Systems: Review of Systems  Constitutional:  Negative for appetite change, fatigue, fever and unexpected weight change.  HENT:   Negative for lump/mass, mouth sores, sore throat and trouble swallowing.   Respiratory:  Negative for cough, hemoptysis and shortness of breath.   Cardiovascular:  Negative for leg swelling and palpitations.  Gastrointestinal:  Negative for abdominal distention, abdominal pain, blood in stool, constipation, diarrhea, nausea and vomiting.  Genitourinary:  Negative for bladder incontinence, dysuria, frequency and hematuria.   Musculoskeletal:  Negative for arthralgias, back pain, gait problem and myalgias.  Skin:  Negative for itching and rash.  Neurological:  Negative for dizziness, extremity weakness, gait problem, headaches, numbness, seizures and speech difficulty.  Hematological:  Does not bruise/bleed easily.  Psychiatric/Behavioral:  Negative for depression and sleep disturbance. The patient is not nervous/anxious.     Physical Exam:  height is 5\' 8"  (1.727 m) and weight is 164 lb 0.6 oz (74.4 kg). His oral temperature is 97.6 F (36.4 C). His blood pressure is 147/78 (abnormal) and his pulse is 68. His respiration is 18 and oxygen saturation is 99%.   Wt Readings from Last 3 Encounters:  04/16/23 164  lb 0.6 oz (74.4 kg)  10/13/22 164 lb (74.4 kg)  04/14/22 160 lb 12.8 oz (72.9 kg)    Physical Exam Vitals reviewed.  HENT:     Head: Normocephalic and atraumatic.  Eyes:     Pupils: Pupils are equal, round, and reactive to light.  Cardiovascular:     Rate and Rhythm: Normal rate and regular rhythm.     Heart sounds: Normal heart sounds.  Pulmonary:     Effort: Pulmonary effort is normal.     Breath sounds: Normal breath sounds.  Abdominal:     General: Bowel sounds are normal.     Palpations: Abdomen is soft.  Musculoskeletal:        General: No  tenderness or deformity. Normal range of motion.     Cervical back: Normal range of motion.  Lymphadenopathy:     Cervical: No cervical adenopathy.  Skin:    General: Skin is warm and dry.     Findings: No erythema or rash.  Neurological:     Mental Status: He is alert and oriented to person, place, and time.  Psychiatric:        Behavior: Behavior normal.        Thought Content: Thought content normal.        Judgment: Judgment normal.     Lab Results  Component Value Date   WBC 7.0 04/16/2023   HGB 13.8 04/16/2023   HCT 40.9 04/16/2023   MCV 90.9 04/16/2023   PLT 165 04/16/2023     Chemistry      Component Value Date/Time   NA 140 04/16/2023 1048   NA 141 08/08/2017 0802   K 4.5 04/16/2023 1048   K 3.7 08/08/2017 0802   CL 102 04/16/2023 1048   CL 105 08/08/2017 0802   CO2 32 04/16/2023 1048   CO2 29 08/08/2017 0802   BUN 20 04/16/2023 1048   BUN 15 08/08/2017 0802   CREATININE 1.33 (H) 04/16/2023 1048   CREATININE 1.1 08/08/2017 0802      Component Value Date/Time   CALCIUM 10.2 04/16/2023 1048   CALCIUM 9.2 08/08/2017 0802   ALKPHOS 70 04/16/2023 1048   ALKPHOS 86 (H) 08/08/2017 0802   AST 26 04/16/2023 1048   ALT 19 04/16/2023 1048   ALT 64 (H) 08/08/2017 0802   BILITOT 0.8 04/16/2023 1048       Impression and Plan: Jack Gray is a 77 year old white male.  He had a stage IIIb adenocarcinoma of the sigmoid colon.  He had this resected.  Even though he had negative lymph nodes, he had 2 nodules which may have been lymph nodes.  He received adjuvant chemotherapy with FOLFOX.  He then had a  pulmonary recurrence which was fairly early after completing the FOLFOX chemotherapy.  Thankfully, he had a solitary recurrence.  He had this resected.  He then had a second recurrence.  This was also in the lung.  He completed SBRT in April, 2020.  I am happy that the CT scan looks good.  We will go ahead and plan for another CT scan in 6 months.  I think this CT scan  looks fine, that we can just follow him along and do scans if we see any problems with his labs.  I am just happy that he is doing well.  Sound like his wife, even though she has her health issues, is doing okay.     Christin Bach, MD

## 2023-05-01 ENCOUNTER — Other Ambulatory Visit (HOSPITAL_BASED_OUTPATIENT_CLINIC_OR_DEPARTMENT_OTHER): Payer: Self-pay

## 2023-05-01 DIAGNOSIS — Z23 Encounter for immunization: Secondary | ICD-10-CM | POA: Diagnosis not present

## 2023-05-01 MED ORDER — FLUAD 0.5 ML IM SUSY
PREFILLED_SYRINGE | INTRAMUSCULAR | 0 refills | Status: DC
  Filled 2023-05-01: qty 0.5, 1d supply, fill #0

## 2023-05-16 ENCOUNTER — Encounter: Payer: Self-pay | Admitting: Physician Assistant

## 2023-05-16 ENCOUNTER — Ambulatory Visit: Payer: Medicare Other | Admitting: Physician Assistant

## 2023-05-16 DIAGNOSIS — M25511 Pain in right shoulder: Secondary | ICD-10-CM | POA: Diagnosis not present

## 2023-05-16 NOTE — Progress Notes (Signed)
Office Visit Note   Patient: Jack Gray           Date of Birth: 08-Jan-1946           MRN: [id number] Visit Date: 05/16/2023              Requested by: Gweneth Dimitri, MD [address] [place],  [place] [address] PCP: Gweneth Dimitri, MD  Chief Complaint  Patient presents with   Right Shoulder - Pain      HPI:  Jack Gray is an extremely active 77 year old gentleman who is right-hand dominant.  He was followed by Dr. Cleophas Dunker for right greater than left shoulder pain.  He has had rotator cuff repairs by Dr. Geralyn Flash many years ago.  He did have a recurrence of his symptoms last year when he grounded a golf club.  An MRI was ordered by Dr. Cleophas Dunker he also had a subacromial injection.  MRI demonstrated postsurgical changes does not appreciate a new rotator cuff tear.  He had degenerative changes of both the acromioclavicular joint as well as the glenohumeral joint.  He is concerned because he is concerned that the injection really did not help him very much.  He has pain especially with external rotation actively.  He does admit he has tried some chiropractic care for his neck and that seems to help sometimes but he feels the shoulder is the main problem and it hurts deep in the joint Assessment & Plan: Visit Diagnoses: Right shoulder pain  Plan: Had a long discussion with the patient.  He has had a subacromial injection with Dr. Cleophas Dunker that really did not help him.  I am wondering if some of his symptoms are more consistent with his glenohumeral arthritis.  He really would like to know if there is anything else that can be done.  I do think he may benefit diagnostically from a glenohumeral injection.  Will refer him to Dr. August Saucer who can also review other options with him.  He does have good strength and range of motion  Follow-Up Instructions: No follow-ups on file.   Ortho Exam  Patient is alert, oriented, no adenopathy, well-dressed, normal affect, normal respiratory  effort. Examination of his right shoulder he has well-healed surgical incisions he has good forward elevation internal/external rotation.  Good biceps triceps strength.  He is neurovascularly intact.  Has some pain and slight stiffness with external rotation.  Positive impingement findings as well.  Grip strength is intact no paresthesias  Imaging: No results found. No images are attached to the encounter.  Labs: Lab Results  Component Value Date   HGBA1C 5.5 05/24/2017   REPTSTATUS 11/02/2009 FINAL 10/31/2009   CULT NO GROWTH 10/31/2009     Lab Results  Component Value Date   ALBUMIN 4.9 04/16/2023   ALBUMIN 4.8 10/13/2022   ALBUMIN 4.7 04/14/2022    No results found for: "MG" No results found for: "VD25OH"  No results found for: "PREALBUMIN"    Latest Ref Rng & Units 04/16/2023   10:48 AM 10/13/2022    8:37 AM 04/14/2022    8:53 AM  CBC EXTENDED  WBC 4.0 - 10.5 K/uL 7.0  6.4  7.0   RBC 4.22 - 5.81 MIL/uL 4.50  4.31  4.46   Hemoglobin 13.0 - 17.0 g/dL 62.1  30.8  65.7   HCT 39.0 - 52.0 % 40.9  39.0  40.4   Platelets 150 - 400 K/uL 165  161  162   NEUT# 1.7 -  7.7 K/uL 4.4  3.8  4.5   Lymph# 0.7 - 4.0 K/uL 1.7  1.7  1.6      There is no height or weight on file to calculate BMI.  Orders:  No orders of the defined types were placed in this encounter.  No orders of the defined types were placed in this encounter.    Procedures: No procedures performed  Clinical Data: No additional findings.  ROS:  All other systems negative, except as noted in the HPI. Review of Systems  Objective: Vital Signs: There were no vitals taken for this visit.  Specialty Comments:  No specialty comments available.  PMFS History: Patient Active Problem List   Diagnosis Date Noted   Pain in right shoulder 02/15/2022   Solitary pulmonary nodule 11/07/2018   Lung mass 08/08/2018   Counseling regarding goals of care 06/18/2017   Cancer of sigmoid colon (HCC) 05/30/2017   Past  Medical History:  Diagnosis Date   Anemia    as a child   Cancer of sigmoid colon (HCC) 05/30/2017   Counseling regarding goals of care 06/18/2017   History of kidney stones    Pneumonia    as a child    No family history on file.  Past Surgical History:  Procedure Laterality Date   CARDIAC CATHETERIZATION     at age 83-negative   cyctoscopy     for stones   EXTRACORPOREAL SHOCK WAVE LITHOTRIPSY Right 06/05/2019   Procedure: EXTRACORPOREAL SHOCK WAVE LITHOTRIPSY (ESWL);  Surgeon: Heloise Purpura, MD;  Location: [facility];  Service: Urology;  Laterality: Right;   HERNIA REPAIR     r and left inguinal  30 years ago   PARTIAL COLECTOMY Left 05/30/2017   Procedure: LAPAROSCOPIC ASSISTED LEFT COLECTOMY;  Surgeon: Claud Kelp, MD;  Location: [facility];  Service: General;  Laterality: Left;  GENERAL AND TAP BLOCK    PORTACATH PLACEMENT Right 06/11/2017   Procedure: INSERTION PORT-A-CATH;  Surgeon: Claud Kelp, MD;  Location: [facility];  Service: General;  Laterality: Right;   SHOULDER SURGERY     Bil   VIDEO ASSISTED THORACOSCOPY (VATS)/WEDGE RESECTION Left 08/08/2018   Procedure: VIDEO ASSISTED THORACOSCOPY (VATS)/WEDGE RESECTION LEFT LOWER LOBE, LYMPH NODE DISSECTION;  Surgeon: Loreli Slot, MD;  Location: [facility];  Service: Thoracic;  Laterality: Left;   Social History   Occupational History   Not on file  Tobacco Use   Smoking status: Never   Smokeless tobacco: Never  Vaping Use   Vaping status: Never Used  Substance and Sexual Activity   Alcohol use: Yes    Comment: occasional   Drug use: No   Sexual activity: Yes

## 2023-05-16 NOTE — Addendum Note (Signed)
Addended by: Rip Harbour on: 05/16/2023 02:32 PM   Modules accepted: Orders

## 2023-05-22 ENCOUNTER — Other Ambulatory Visit (HOSPITAL_BASED_OUTPATIENT_CLINIC_OR_DEPARTMENT_OTHER): Payer: Self-pay

## 2023-05-22 DIAGNOSIS — Z23 Encounter for immunization: Secondary | ICD-10-CM | POA: Diagnosis not present

## 2023-05-22 MED ORDER — COMIRNATY 30 MCG/0.3ML IM SUSY
PREFILLED_SYRINGE | Freq: Once | INTRAMUSCULAR | 0 refills | Status: AC
  Filled 2023-05-22: qty 0.3, 1d supply, fill #0

## 2023-05-30 ENCOUNTER — Encounter: Payer: Self-pay | Admitting: Orthopedic Surgery

## 2023-05-30 ENCOUNTER — Ambulatory Visit: Payer: Medicare Other | Admitting: Orthopedic Surgery

## 2023-05-30 DIAGNOSIS — Z9889 Other specified postprocedural states: Secondary | ICD-10-CM | POA: Diagnosis not present

## 2023-05-30 DIAGNOSIS — M25511 Pain in right shoulder: Secondary | ICD-10-CM

## 2023-05-30 NOTE — Progress Notes (Signed)
Office Visit Note   Patient: Jack Gray           Date of Birth: 1946/06/17           MRN: [id number] Visit Date: 05/30/2023 Requested by: Persons, [place], [place] [address] [place],  [place] [address] PCP: Gweneth Dimitri, MD  Subjective: Chief Complaint  Patient presents with   Right Shoulder - Pain    HPI: IDAN NAZARI is a 77 y.o. male who presents to the office reporting right shoulder pain.  Been going on for a year.  Injured his right shoulder playing golf.  He plays golf about once a week.  He is swelling and hit the ground with the club.  He is right-hand dominant.  Pain does wake him from sleep at night.  Denies any neck pain but does have some radicular symptoms and numbness and tingling.  Has low bit of aching in the biceps and elbow region.  Has history of right shoulder rotator cuff surgery done 20 years ago with spur treatment as well.  He has been doing a home exercise program 5 times a week since that surgery 20 years ago.  Reaching behind his ears does cause some pain.  He had an MRI scan in 2023 which showed intact rotator cuff tear repair.  Decision point today was for or against glenohumeral joint injection..                ROS: All systems reviewed are negative as they relate to the chief complaint within the history of present illness.  Patient denies fevers or chills.  Assessment & Plan: Visit Diagnoses:  1. Right shoulder pain, unspecified chronicity   2. History of repair of rotator cuff     Plan: Impression is right shoulder pain.  Patient is very active.  Has some coarseness and grinding on exam which is consistent with either bursitis or recurrent tear.  MRI arthrogram indicated to determine whether or not surgical intervention is required.  Passive range of motion is good but I think there could be some problems based on mechanism of injury and current physical exam findings.  Follow-Up Instructions: No follow-ups on file.   Orders:  Orders Placed This  Encounter  Procedures   MR SHOULDER RIGHT W CONTRAST   Arthrogram   No orders of the defined types were placed in this encounter.     Procedures: No procedures performed   Clinical Data: No additional findings.  Objective: Vital Signs: There were no vitals taken for this visit.  Physical Exam:  Constitutional: Patient appears well-developed HEENT:  Head: Normocephalic Eyes:EOM are normal Neck: Normal range of motion Cardiovascular: Normal rate Pulmonary/chest: Effort normal Neurologic: Patient is alert Skin: Skin is warm Psychiatric: Patient has normal mood and affect  Ortho Exam: Ortho exam demonstrates range of motion on the right of 60/100/170.  Does have coarseness on the right with internal/external rotation of the shoulder at 90 degrees of abduction.  No masses lymphadenopathy or skin changes noted in that shoulder girdle region.  Cervical spine range of motion intact.  Specialty Comments:  No specialty comments available.  Imaging: No results found.   PMFS History: Patient Active Problem List   Diagnosis Date Noted   Pain in right shoulder 02/15/2022   Solitary pulmonary nodule 11/07/2018   Lung mass 08/08/2018   Counseling regarding goals of care 06/18/2017   Cancer of sigmoid colon (HCC) 05/30/2017   Past Medical History:  Diagnosis Date  Anemia    as a child   Cancer of sigmoid colon (HCC) 05/30/2017   Counseling regarding goals of care 06/18/2017   History of kidney stones    Pneumonia    as a child    History reviewed. No pertinent family history.  Past Surgical History:  Procedure Laterality Date   CARDIAC CATHETERIZATION     at age 61-negative   cyctoscopy     for stones   EXTRACORPOREAL SHOCK WAVE LITHOTRIPSY Right 06/05/2019   Procedure: EXTRACORPOREAL SHOCK WAVE LITHOTRIPSY (ESWL);  Surgeon: Heloise Purpura, MD;  Location: [facility];  Service: Urology;  Laterality: Right;   HERNIA REPAIR     r and left inguinal  30 years ago    PARTIAL COLECTOMY Left 05/30/2017   Procedure: LAPAROSCOPIC ASSISTED LEFT COLECTOMY;  Surgeon: Claud Kelp, MD;  Location: [facility];  Service: General;  Laterality: Left;  GENERAL AND TAP BLOCK    PORTACATH PLACEMENT Right 06/11/2017   Procedure: INSERTION PORT-A-CATH;  Surgeon: Claud Kelp, MD;  Location: [facility];  Service: General;  Laterality: Right;   SHOULDER SURGERY     Bil   VIDEO ASSISTED THORACOSCOPY (VATS)/WEDGE RESECTION Left 08/08/2018   Procedure: VIDEO ASSISTED THORACOSCOPY (VATS)/WEDGE RESECTION LEFT LOWER LOBE, LYMPH NODE DISSECTION;  Surgeon: Loreli Slot, MD;  Location: [facility];  Service: Thoracic;  Laterality: Left;   Social History   Occupational History   Not on file  Tobacco Use   Smoking status: Never   Smokeless tobacco: Never  Vaping Use   Vaping status: Never Used  Substance and Sexual Activity   Alcohol use: Yes    Comment: occasional   Drug use: No   Sexual activity: Yes

## 2023-06-02 ENCOUNTER — Encounter: Payer: Self-pay | Admitting: Orthopedic Surgery

## 2023-06-07 DIAGNOSIS — Z6825 Body mass index (BMI) 25.0-25.9, adult: Secondary | ICD-10-CM | POA: Diagnosis not present

## 2023-06-07 DIAGNOSIS — C7800 Secondary malignant neoplasm of unspecified lung: Secondary | ICD-10-CM | POA: Diagnosis not present

## 2023-06-07 DIAGNOSIS — M75101 Unspecified rotator cuff tear or rupture of right shoulder, not specified as traumatic: Secondary | ICD-10-CM | POA: Diagnosis not present

## 2023-06-07 DIAGNOSIS — Z2911 Encounter for prophylactic immunotherapy for respiratory syncytial virus (RSV): Secondary | ICD-10-CM | POA: Diagnosis not present

## 2023-06-07 DIAGNOSIS — C187 Malignant neoplasm of sigmoid colon: Secondary | ICD-10-CM | POA: Diagnosis not present

## 2023-06-07 DIAGNOSIS — N1831 Chronic kidney disease, stage 3a: Secondary | ICD-10-CM | POA: Diagnosis not present

## 2023-06-07 DIAGNOSIS — E782 Mixed hyperlipidemia: Secondary | ICD-10-CM | POA: Diagnosis not present

## 2023-06-21 DIAGNOSIS — N200 Calculus of kidney: Secondary | ICD-10-CM | POA: Diagnosis not present

## 2023-06-26 DIAGNOSIS — H35363 Drusen (degenerative) of macula, bilateral: Secondary | ICD-10-CM | POA: Diagnosis not present

## 2023-07-04 ENCOUNTER — Ambulatory Visit
Admission: RE | Admit: 2023-07-04 | Discharge: 2023-07-04 | Disposition: A | Discharge status: Home / Self Care | Payer: Medicare Other | Source: Ambulatory Visit | Attending: Orthopedic Surgery | Admitting: Orthopedic Surgery

## 2023-07-04 DIAGNOSIS — M25511 Pain in right shoulder: Secondary | ICD-10-CM

## 2023-07-04 DIAGNOSIS — M19011 Primary osteoarthritis, right shoulder: Secondary | ICD-10-CM | POA: Diagnosis not present

## 2023-07-04 DIAGNOSIS — Z9889 Other specified postprocedural states: Secondary | ICD-10-CM

## 2023-07-04 DIAGNOSIS — M7581 Other shoulder lesions, right shoulder: Secondary | ICD-10-CM | POA: Diagnosis not present

## 2023-07-04 MED ORDER — IOPAMIDOL (ISOVUE-M 200) INJECTION 41%
12.0000 mL | Freq: Once | INTRAMUSCULAR | Status: AC
  Administered 2023-07-04: 12 mL via INTRA_ARTICULAR

## 2023-07-20 ENCOUNTER — Ambulatory Visit (INDEPENDENT_AMBULATORY_CARE_PROVIDER_SITE_OTHER): Payer: Medicare Other | Admitting: Orthopedic Surgery

## 2023-07-20 ENCOUNTER — Encounter: Payer: Self-pay | Admitting: Orthopedic Surgery

## 2023-07-20 DIAGNOSIS — M25511 Pain in right shoulder: Secondary | ICD-10-CM | POA: Diagnosis not present

## 2023-07-20 NOTE — Progress Notes (Signed)
Office Visit Note   Patient: Jack Gray           Date of Birth: 03-17-1946           MRN: [id number] Visit Date: 07/20/2023 Requested by: Gweneth Dimitri, MD [address] [place],  [place] [address] PCP: Gweneth Dimitri, MD  Subjective: Chief Complaint  Patient presents with   Other    Review MRI    HPI: Jack Gray is a 77 y.o. male who presents to the office reporting [name] is a patient with right shoulder pain.  Since he was last seen has had an MRI scan which is reviewed with the patient.  Patient likes to play golf weekly.  MRI scan shows partial-thickness rotator cuff tearing with no significant glenohumeral joint arthritis.  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  Biceps tendon appears intact.  Patient states he is having some pain radiating into the biceps.  He has been very active.  Rotator cuff repair surgery was about 20 years ago..                ROS: All systems reviewed are negative as they relate to the chief complaint within the history of present illness.  Patient denies fevers or chills.  Assessment & Plan: Visit Diagnoses:  1. Right shoulder pain, unspecified chronicity     Plan: Impression is probable biceps tendon mediated pain in the shoulder.  I think the patient plays golf once a week.  He needs to be careful lifting things out away from his body.  If his symptoms worsen then intra-articular injection would be helpful.  He will follow-up with [name] as needed.  Follow-Up Instructions: No follow-ups on file.   Orders:  No orders of the defined types were placed in this encounter.  No orders of the defined types were placed in this encounter.     Procedures: No procedures performed   Clinical Data: No additional findings.  Objective: Vital Signs: There were no vitals taken for this visit.  Physical Exam:  Constitutional: Patient appears well-developed HEENT:  Head: Normocephalic Eyes:EOM are normal Neck: Normal range of motion Cardiovascular:  Normal rate Pulmonary/chest: Effort normal Neurologic: Patient is alert Skin: Skin is warm Psychiatric: Patient has normal mood and affect  Ortho Exam: Ortho exam demonstrates range of motion both shoulders is 60/90/160.  Rotator cuff strength is intact infraspinatus supraspinatus and subscap muscle testing.  No discrete AC joint tenderness is present.  O'Brien's testing equivocal on the right negative on the left.  No popping or grinding with internal/external rotation of the right shoulder.  Specialty Comments:  No specialty comments available.  Imaging: No results found.   PMFS History: Patient Active Problem List   Diagnosis Date Noted   Pain in right shoulder 02/15/2022   Solitary pulmonary nodule 11/07/2018   Lung mass 08/08/2018   Counseling regarding goals of care 06/18/2017   Cancer of sigmoid colon (HCC) 05/30/2017   Past Medical History:  Diagnosis Date   Anemia    as a child   Cancer of sigmoid colon (HCC) 05/30/2017   Counseling regarding goals of care 06/18/2017   History of kidney stones    Pneumonia    as a child    No family history on file.  Past Surgical History:  Procedure Laterality Date   CARDIAC CATHETERIZATION     at age 68-negative   cyctoscopy     for stones   EXTRACORPOREAL SHOCK WAVE LITHOTRIPSY Right 06/05/2019   Procedure:  EXTRACORPOREAL SHOCK WAVE LITHOTRIPSY (ESWL);  Surgeon: Heloise Purpura, MD;  Location: [facility];  Service: Urology;  Laterality: Right;   HERNIA REPAIR     r and left inguinal  30 years ago   PARTIAL COLECTOMY Left 05/30/2017   Procedure: LAPAROSCOPIC ASSISTED LEFT COLECTOMY;  Surgeon: Claud Kelp, MD;  Location: [facility];  Service: General;  Laterality: Left;  GENERAL AND TAP BLOCK    PORTACATH PLACEMENT Right 06/11/2017   Procedure: INSERTION PORT-A-CATH;  Surgeon: Claud Kelp, MD;  Location: [facility];  Service: General;  Laterality: Right;   SHOULDER SURGERY     Bil   VIDEO ASSISTED THORACOSCOPY  (VATS)/WEDGE RESECTION Left 08/08/2018   Procedure: VIDEO ASSISTED THORACOSCOPY (VATS)/WEDGE RESECTION LEFT LOWER LOBE, LYMPH NODE DISSECTION;  Surgeon: Loreli Slot, MD;  Location: [facility];  Service: Thoracic;  Laterality: Left;   Social History   Occupational History   Not on file  Tobacco Use   Smoking status: Never   Smokeless tobacco: Never  Vaping Use   Vaping status: Never Used  Substance and Sexual Activity   Alcohol use: Yes    Comment: occasional   Drug use: No   Sexual activity: Yes

## 2023-08-23 ENCOUNTER — Encounter: Payer: Self-pay | Admitting: Orthopedic Surgery

## 2023-08-23 ENCOUNTER — Other Ambulatory Visit: Payer: Self-pay

## 2023-08-23 DIAGNOSIS — M25511 Pain in right shoulder: Secondary | ICD-10-CM

## 2023-08-23 NOTE — Telephone Encounter (Signed)
Sure thxrom strengthening 3 x week 6 w thx

## 2023-08-29 NOTE — Therapy (Signed)
OUTPATIENT PHYSICAL THERAPY SHOULDER EVALUATION   Patient Name: Jack Gray MRN: [id number] DOB:1945-10-20, 78 y.o., male Today's Date: 08/30/2023  END OF SESSION:  PT End of Session - 08/30/23 1000     Visit Number 1    Number of Visits 10    Date for PT Re-Evaluation 10/25/23    Authorization Type MEDICARE    Progress Note Due on Visit 10    PT Start Time 0957    PT Stop Time 1054    PT Time Calculation (min) 57 min    Activity Tolerance Patient tolerated treatment well;No increased pain;Patient limited by pain    Behavior During Therapy [facility] for tasks assessed/performed             Past Medical History:  Diagnosis Date   Anemia    as a child   Cancer of sigmoid colon (HCC) 05/30/2017   Counseling regarding goals of care 06/18/2017   History of kidney stones    Pneumonia    as a child   Past Surgical History:  Procedure Laterality Date   CARDIAC CATHETERIZATION     at age 70-negative   cyctoscopy     for stones   EXTRACORPOREAL SHOCK WAVE LITHOTRIPSY Right 06/05/2019   Procedure: EXTRACORPOREAL SHOCK WAVE LITHOTRIPSY (ESWL);  Surgeon: Heloise Purpura, MD;  Location: [facility];  Service: Urology;  Laterality: Right;   HERNIA REPAIR     r and left inguinal  30 years ago   PARTIAL COLECTOMY Left 05/30/2017   Procedure: LAPAROSCOPIC ASSISTED LEFT COLECTOMY;  Surgeon: Claud Kelp, MD;  Location: [facility];  Service: General;  Laterality: Left;  GENERAL AND TAP BLOCK    PORTACATH PLACEMENT Right 06/11/2017   Procedure: INSERTION PORT-A-CATH;  Surgeon: Claud Kelp, MD;  Location: [facility];  Service: General;  Laterality: Right;   SHOULDER SURGERY     Bil   VIDEO ASSISTED THORACOSCOPY (VATS)/WEDGE RESECTION Left 08/08/2018   Procedure: VIDEO ASSISTED THORACOSCOPY (VATS)/WEDGE RESECTION LEFT LOWER LOBE, LYMPH NODE DISSECTION;  Surgeon: Loreli Slot, MD;  Location: [facility];  Service: Thoracic;  Laterality: Left;   Patient Active Problem List    Diagnosis Date Noted   Pain in right shoulder 02/15/2022   Solitary pulmonary nodule 11/07/2018   Lung mass 08/08/2018   Counseling regarding goals of care 06/18/2017   Cancer of sigmoid colon (HCC) 05/30/2017    PCP: Gweneth Dimitri, MD  REFERRING PROVIDER: Cammy Copa, MD  REFERRING DIAG: Diagnosis M25.511 (ICD-10-CM) - Right shoulder pain, unspecified chronicity  THERAPY DIAG:  Abnormal posture  Stiffness of right shoulder, not elsewhere classified  Chronic right shoulder pain  Muscle weakness (generalized)  Rationale for Evaluation and Treatment: Rehabilitation  ONSET DATE: July 2023  SUBJECTIVE:  SUBJECTIVE STATEMENT: Jack Gray took a huge divot playing golf in July of 2023 which resulted in the original injury.  He is playing golf now but is fearful of taking another large divot and further injuring his shoulder.  He has a history of a previous right rotator cuff repair and left shoulder surgery.    Hand dominance: Right  PERTINENT HISTORY: Kidney stones, hernia repair, colon cancer  PAIN:  Are you having pain? Yes: NPRS scale: 1-6/10 this week Pain location: Rt shoulder Pain description: Quick jab, sharp pain that goes away quickly Aggravating factors: Extension, lifting with an extended elbow away from the body Relieving factors: Get out of painful positions  PRECAUTIONS: None  RED FLAGS: None   WEIGHT BEARING RESTRICTIONS: No  FALLS:  Has patient fallen in last 6 months? No  LIVING ENVIRONMENT: Lives with: lives with their family and lives with their spouse Lives in: House/apartment Stairs:  No issues Has following equipment at home: None  OCCUPATION: Retired  PLOF: Independent  PATIENT GOALS: Return to golf, running errands, walking without being limited by  pain  NEXT MD VISIT:   OBJECTIVE:  Note: Objective measures were completed at Evaluation unless otherwise noted.  DIAGNOSTIC FINDINGS:  IMPRESSION: 1. Partial thickness articular surface tears of the distal supraspinatus and upper infraspinatus tendons. I do not observe a definite full-thickness tear, but there is some contrast in the subacromial subdeltoid bursa. It looks like this patient was injected through the rotator interval, it is possible that this contrast leaked through the rotator interval into the subacromial subdeltoid bursa during injection rather than necessarily being from an occult full-thickness tear. 2. Moderate tendinopathy of the intra-articular segment of the biceps tendon. 3. Moderate degenerative chondral thinning in the glenohumeral joint. 4. Mild degenerative spurring in the acromioclavicular joint.    PATIENT SURVEYS:  FOTO risk adjusted 54 (Goal 71 in 10 visits)  COGNITION: Overall cognitive status: Within functional limits for tasks assessed     SENSATION: WFL  POSTURE: Slightly forward head, internally rotated and protracted shoulders  UPPER EXTREMITY ROM:   Passive ROM in degrees Left/Right 08/30/2023   Shoulder flexion 160/160   Shoulder extension    Shoulder abduction    Shoulder horizontal adduction 40/30   Shoulder internal rotation 55/40   Shoulder external rotation 95/85   Elbow flexion    Elbow extension    Wrist flexion    Wrist extension    Wrist ulnar deviation    Wrist radial deviation    Wrist pronation    Wrist supination    (Blank rows = not tested)  UPPER EXTREMITY STRENGTH:  In pounds assessed with hand-held-dynamometer Left/Right 08/30/2023   Shoulder flexion    Shoulder extension    Shoulder abduction    Shoulder adduction    Shoulder internal rotation 44.7/38.4   Shoulder external rotation 27.1/23.8   Middle trapezius    Lower trapezius    Elbow flexion    Elbow extension    Wrist flexion    Wrist  extension    Wrist ulnar deviation    Wrist radial deviation    Wrist pronation    Wrist supination    Grip strength (lbs)    (Blank rows = not tested)  TREATMENT DATE:  08/30/2023 Scapular retraction/shoulder blade pinches 10 x 5 seconds Supine shoulder internal rotation stretch 10 x 10 seconds Posterior capsule stretch 10 x 10 seconds Thumb up the back/shoulder IR stretch 10 x 10 seconds  Functional Activities: Reviewed Jack Gray's imaging with the shoulder model and explained the anatomy and biomechanics of the shoulder.  Reviewed examination findings, expectations post physical therapy and long-term prognosis which is good as long as he keeps up with his exercises and avoids aggravating factors like quick jerking movements and lifting with elbow extended away from the body.  PATIENT EDUCATION: Education details: See above Person educated: Patient Education method: Explanation, Demonstration, Tactile cues, Verbal cues, and Handouts Education comprehension: verbalized understanding, returned demonstration, verbal cues required, tactile cues required, and needs further education  HOME EXERCISE PROGRAM: Access Code: [email or web address] URL: [email or web address] Date: 08/30/2023 Prepared by: Pauletta Browns  Exercises - Supine Shoulder Internal Rotation Stretch  - 2 x daily - 7 x weekly - 1 sets - 20 reps - 10 seconds hold - Standing Shoulder Posterior Capsule Stretch  - 2 x daily - 7 x weekly - 1 sets - 10 reps - 10 seconds hold - Standing Shoulder Internal Rotation Stretch with Hands Behind Back  - 2 x daily - 7 x weekly - 1 sets - 10 reps - 10 seconds hold - Standing Scapular Retraction  - 5 x daily - 7 x weekly - 1 sets - 5 reps - 5 second hold  ASSESSMENT:  CLINICAL IMPRESSION: Patient is a 78 y.o. male who was seen today for physical therapy evaluation  and treatment for Diagnosis M25.511 (ICD-10-CM) - Right shoulder pain, unspecified chronicity.  Jack Gray had a previous right rotator cuff repair and the current episode originated in July 2023 when playing golf.  Jack Gray would like to avoid another surgery while still being being able to play golf and do his normal functional activities without pain.  Overall, right shoulder strength is not bad, but scapular and rotator cuff strength could improve and his capsular flexibility is quite limited, particularly in the posterior capsule.  This was the focus of today's activities as Jack Gray reports he has been doing his [employer] activities since surgery on a regular basis.  He will still benefit from more specific scapular and rotator cuff strengthening activities and is a good candidate for supervised physical therapy.  OBJECTIVE IMPAIRMENTS: decreased activity tolerance, decreased endurance, decreased knowledge of condition, decreased ROM, decreased strength, decreased safety awareness, increased edema, impaired perceived functional ability, impaired UE functional use, postural dysfunction, and pain.   ACTIVITY LIMITATIONS: carrying, lifting, sleeping, and reach over head  PARTICIPATION LIMITATIONS: community activity and golf  PERSONAL FACTORS: Kidney stones, hernia repair, colon cancer are also affecting patient's functional outcome.   REHAB POTENTIAL: Good  CLINICAL DECISION MAKING: Stable/uncomplicated  EVALUATION COMPLEXITY: Low   GOALS: Goals reviewed with patient? Yes  SHORT TERM GOALS: Target date: 09/27/2023  Jack Gray will be independent with his day 1 HEP Baseline: Started 08/30/2023 Goal status: INITIAL  2.  Improve right shoulder active range of motion to 100% of the uninvolved left Baseline: See above, right side is more limited, particularly with the posterior capsule Goal status: INITIAL  3.  Improve right shoulder strength to at least 100% of the uninvolved left Baseline: See  objective, right side is weaker Goal status: INITIAL   LONG TERM GOALS: Target date: 10/25/2023  Improve FOTO to 71 in 10 visits Baseline: Risk adjusted 54 Goal status: INITIAL  2.  Nobert will  report right shoulder pain consistently 0-3/10 on the Numeric Pain Rating Scale. Baseline: 1-6/10 Goal status: INITIAL  3.  Improve shoulder AROM for flexion to 170/170; IR to 60/60; ER to 90/90 and horizontal adduction to 40/40 Baseline: Flexion 160/160; IR 55/40; ER 95/85 and horizontal adduction 40/30 Goal status: INITIAL  4.  Improve shoulder strength for IR to 45/45 and ER to 30/30 Baseline: See objective Goal status: INITIAL  5.  Avary will be independent with his long-term maintenance HEP at DC Baseline: Started 08/30/2023 Goal status: INITIAL   PLAN:  PT FREQUENCY: 1-2x/week  PT DURATION: 8 weeks  PLANNED INTERVENTIONS: 97110-Therapeutic exercises, 97530- Therapeutic activity, 97112- Neuromuscular re-education, 97535- Self Care, [id number]- Manual therapy, Patient/Family education, Joint mobilization, and Cryotherapy  PLAN FOR NEXT SESSION: Review his stretches and exercises from day 1.  More specific rotator cuff and scapular strengthening activities for eventual return to golf, improved active range of motion, strength and less pain.   Cherlyn Cushing, PT, MPT 08/30/2023, 5:05 PM

## 2023-08-30 ENCOUNTER — Ambulatory Visit: Payer: Medicare Other | Admitting: Rehabilitative and Restorative Service Providers"

## 2023-08-30 ENCOUNTER — Encounter: Payer: Self-pay | Admitting: Rehabilitative and Restorative Service Providers"

## 2023-08-30 DIAGNOSIS — R293 Abnormal posture: Secondary | ICD-10-CM

## 2023-08-30 DIAGNOSIS — M6281 Muscle weakness (generalized): Secondary | ICD-10-CM

## 2023-08-30 DIAGNOSIS — G8929 Other chronic pain: Secondary | ICD-10-CM | POA: Diagnosis not present

## 2023-08-30 DIAGNOSIS — M25511 Pain in right shoulder: Secondary | ICD-10-CM | POA: Diagnosis not present

## 2023-08-30 DIAGNOSIS — M25611 Stiffness of right shoulder, not elsewhere classified: Secondary | ICD-10-CM

## 2023-09-03 DIAGNOSIS — Z0000 Encounter for general adult medical examination without abnormal findings: Secondary | ICD-10-CM | POA: Diagnosis not present

## 2023-09-03 DIAGNOSIS — Z6825 Body mass index (BMI) 25.0-25.9, adult: Secondary | ICD-10-CM | POA: Diagnosis not present

## 2023-09-03 DIAGNOSIS — Z1331 Encounter for screening for depression: Secondary | ICD-10-CM | POA: Diagnosis not present

## 2023-09-05 ENCOUNTER — Encounter: Payer: Self-pay | Admitting: Rehabilitative and Restorative Service Providers"

## 2023-09-05 ENCOUNTER — Ambulatory Visit (INDEPENDENT_AMBULATORY_CARE_PROVIDER_SITE_OTHER): Payer: Medicare Other | Admitting: Rehabilitative and Restorative Service Providers"

## 2023-09-05 DIAGNOSIS — M25511 Pain in right shoulder: Secondary | ICD-10-CM | POA: Diagnosis not present

## 2023-09-05 DIAGNOSIS — R293 Abnormal posture: Secondary | ICD-10-CM | POA: Diagnosis not present

## 2023-09-05 DIAGNOSIS — M6281 Muscle weakness (generalized): Secondary | ICD-10-CM | POA: Diagnosis not present

## 2023-09-05 DIAGNOSIS — G8929 Other chronic pain: Secondary | ICD-10-CM

## 2023-09-05 DIAGNOSIS — M25611 Stiffness of right shoulder, not elsewhere classified: Secondary | ICD-10-CM

## 2023-09-05 NOTE — Therapy (Signed)
OUTPATIENT PHYSICAL THERAPY SHOULDER TREATMENT   Patient Name: Jack Gray MRN: [id number] DOB:October 30, 1945, 78 y.o., male Today's Date: 09/05/2023  END OF SESSION:  PT End of Session - 09/05/23 0929     Visit Number 2    Number of Visits 10    Date for PT Re-Evaluation 10/25/23    Authorization Type MEDICARE    Progress Note Due on Visit 10    PT Start Time 0929    PT Stop Time 1012    PT Time Calculation (min) 43 min    Activity Tolerance Patient tolerated treatment well;No increased pain    Behavior During Therapy [facility] for tasks assessed/performed              Past Medical History:  Diagnosis Date   Anemia    as a child   Cancer of sigmoid colon (HCC) 05/30/2017   Counseling regarding goals of care 06/18/2017   History of kidney stones    Pneumonia    as a child   Past Surgical History:  Procedure Laterality Date   CARDIAC CATHETERIZATION     at age 60-negative   cyctoscopy     for stones   EXTRACORPOREAL SHOCK WAVE LITHOTRIPSY Right 06/05/2019   Procedure: EXTRACORPOREAL SHOCK WAVE LITHOTRIPSY (ESWL);  Surgeon: Heloise Purpura, MD;  Location: [facility];  Service: Urology;  Laterality: Right;   HERNIA REPAIR     r and left inguinal  30 years ago   PARTIAL COLECTOMY Left 05/30/2017   Procedure: LAPAROSCOPIC ASSISTED LEFT COLECTOMY;  Surgeon: Claud Kelp, MD;  Location: [facility];  Service: General;  Laterality: Left;  GENERAL AND TAP BLOCK    PORTACATH PLACEMENT Right 06/11/2017   Procedure: INSERTION PORT-A-CATH;  Surgeon: Claud Kelp, MD;  Location: [facility];  Service: General;  Laterality: Right;   SHOULDER SURGERY     Bil   VIDEO ASSISTED THORACOSCOPY (VATS)/WEDGE RESECTION Left 08/08/2018   Procedure: VIDEO ASSISTED THORACOSCOPY (VATS)/WEDGE RESECTION LEFT LOWER LOBE, LYMPH NODE DISSECTION;  Surgeon: Loreli Slot, MD;  Location: [facility];  Service: Thoracic;  Laterality: Left;   Patient Active Problem List   Diagnosis Date Noted    Pain in right shoulder 02/15/2022   Solitary pulmonary nodule 11/07/2018   Lung mass 08/08/2018   Counseling regarding goals of care 06/18/2017   Cancer of sigmoid colon (HCC) 05/30/2017    PCP: Gweneth Dimitri, MD  REFERRING PROVIDER: Cammy Copa, MD  REFERRING DIAG: Diagnosis M25.511 (ICD-10-CM) - Right shoulder pain, unspecified chronicity  THERAPY DIAG:  Abnormal posture  Stiffness of right shoulder, not elsewhere classified  Chronic right shoulder pain  Muscle weakness (generalized)  Rationale for Evaluation and Treatment: Rehabilitation  ONSET DATE: July 2023  SUBJECTIVE:  SUBJECTIVE STATEMENT: Jack Gray reports good HEP compliance.  He feels a big difference with his "old exercises" having less soreness.  Jack Gray took a huge divot playing golf in July of 2023 which resulted in the original injury.  He is playing golf now but is fearful of taking another large divot and further injuring his shoulder.  He has a history of a previous right rotator cuff repair and left shoulder surgery.    Hand dominance: Right  PERTINENT HISTORY: Kidney stones, hernia repair, colon cancer  PAIN:  Are you having pain? Yes: NPRS scale: 1-6/10 this week Pain location: Rt shoulder Pain description: Quick jab, sharp pain that goes away quickly Aggravating factors: Extension, lifting with an extended elbow away from the body Relieving factors: Get out of painful positions  PRECAUTIONS: None  RED FLAGS: None   WEIGHT BEARING RESTRICTIONS: No  FALLS:  Has patient fallen in last 6 months? No  LIVING ENVIRONMENT: Lives with: lives with their family and lives with their spouse Lives in: House/apartment Stairs:  No issues Has following equipment at home: None  OCCUPATION: Retired  PLOF:  Independent  PATIENT GOALS: Return to golf, running errands, walking without being limited by pain  NEXT MD VISIT:   OBJECTIVE:  Note: Objective measures were completed at Evaluation unless otherwise noted.  DIAGNOSTIC FINDINGS:  IMPRESSION: 1. Partial thickness articular surface tears of the distal supraspinatus and upper infraspinatus tendons. I do not observe a definite full-thickness tear, but there is some contrast in the subacromial subdeltoid bursa. It looks like this patient was injected through the rotator interval, it is possible that this contrast leaked through the rotator interval into the subacromial subdeltoid bursa during injection rather than necessarily being from an occult full-thickness tear. 2. Moderate tendinopathy of the intra-articular segment of the biceps tendon. 3. Moderate degenerative chondral thinning in the glenohumeral joint. 4. Mild degenerative spurring in the acromioclavicular joint.    PATIENT SURVEYS:  FOTO risk adjusted 54 (Goal 71 in 10 visits)  COGNITION: Overall cognitive status: Within functional limits for tasks assessed     SENSATION: WFL  POSTURE: Slightly forward head, internally rotated and protracted shoulders  UPPER EXTREMITY ROM:   Passive ROM in degrees Left/Right 08/30/2023   Shoulder flexion 160/160   Shoulder extension    Shoulder abduction    Shoulder horizontal adduction 40/30   Shoulder internal rotation 55/40   Shoulder external rotation 95/85   Elbow flexion    Elbow extension    Wrist flexion    Wrist extension    Wrist ulnar deviation    Wrist radial deviation    Wrist pronation    Wrist supination    (Blank rows = not tested)  UPPER EXTREMITY STRENGTH:  In pounds assessed with hand-held-dynamometer Left/Right 08/30/2023   Shoulder flexion    Shoulder extension    Shoulder abduction    Shoulder adduction    Shoulder internal rotation 44.7/38.4   Shoulder external rotation 27.1/23.8   Middle  trapezius    Lower trapezius    Elbow flexion    Elbow extension    Wrist flexion    Wrist extension    Wrist ulnar deviation    Wrist radial deviation    Wrist pronation    Wrist supination    Grip strength (lbs)    (Blank rows = not tested)  TREATMENT DATE:  09/05/2023 Scapular retraction/shoulder blade pinches 10 x 5 seconds Supine shoulder internal rotation stretch 10 x 10 seconds Posterior capsule stretch 10 x 10 seconds Thumb up the back/shoulder IR stretch 10 x 10 seconds Thera-Band ER Blue 2 sets of 10 with slow eccentrics Thera-Band IR Blue 10 x slow eccentrics Scapular protraction with Blue Thera-Band 20 x 3 seconds  Functional Activities: Reviewed Jack Gray's old strengthening program with corrective feedback given Body blade IR/ER and abduction/adduction for golf swing 2 x 20 seconds each   08/30/2023 Scapular retraction/shoulder blade pinches 10 x 5 seconds Supine shoulder internal rotation stretch 10 x 10 seconds Posterior capsule stretch 10 x 10 seconds Thumb up the back/shoulder IR stretch 10 x 10 seconds  Functional Activities: Reviewed Jack Gray's imaging with the shoulder model and explained the anatomy and biomechanics of the shoulder.  Reviewed examination findings, expectations post physical therapy and long-term prognosis which is good as long as he keeps up with his exercises and avoids aggravating factors like quick jerking movements and lifting with elbow extended away from the body.  PATIENT EDUCATION: Education details: See above Person educated: Patient Education method: Explanation, Demonstration, Tactile cues, Verbal cues, and Handouts Education comprehension: verbalized understanding, returned demonstration, verbal cues required, tactile cues required, and needs further education  HOME EXERCISE PROGRAM: Access Code:  [email or web address] URL: [email or web address] Date: 09/05/2023 Prepared by: Pauletta Browns  Exercises - Supine Shoulder Internal Rotation Stretch  - 2 x daily - 7 x weekly - 1 sets - 20 reps - 10 seconds hold - Standing Shoulder Posterior Capsule Stretch  - 2 x daily - 7 x weekly - 1 sets - 10 reps - 10 seconds hold - Standing Shoulder Internal Rotation Stretch with Hands Behind Back  - 2 x daily - 7 x weekly - 1 sets - 10 reps - 10 seconds hold - Standing Scapular Retraction  - 5 x daily - 7 x weekly - 1 sets - 5 reps - 5 second hold - Supine Scapular Protraction in Flexion with Dumbbells  - 2 x daily - 7 x weekly - 1 sets - 20 reps - 3 seconds hold - Shoulder External Rotation with Anchored Resistance  - 1-2 x daily - 7 x weekly - 2 sets - 10 reps - 3 hold - Shoulder Internal Rotation with Resistance  - 1-2 x daily - 7 x weekly - 1 sets - 10 reps  ASSESSMENT:  CLINICAL IMPRESSION: Jack Gray notes feeling more comfortable with his long-term exercises after the addition of posterior capsule stretching started at evaluation.  We reviewed his current strength program with emphasis on a few specific scapular and rotator cuff strengthening activities (see objective).  Jack Gray's prognosis to meet long-term goals remains good with the current plan of care.   Patient is a 78 y.o. male who was seen today for physical therapy evaluation and treatment for Diagnosis M25.511 (ICD-10-CM) - Right shoulder pain, unspecified chronicity.  Jack Gray had a previous right rotator cuff repair and the current episode originated in July 2023 when playing golf.  Jack Gray would like to avoid another surgery while still being being able to play golf and do his normal functional activities without pain.  Overall, right shoulder strength is not bad, but scapular and rotator cuff strength could improve and his capsular flexibility is quite limited, particularly in the posterior capsule.  This was the focus of today's activities as  Jack Gray reports he has been doing his Thera-Band activities since surgery on a regular basis.  He will still benefit from more specific scapular and rotator cuff strengthening activities and is a good candidate for supervised physical therapy.  OBJECTIVE IMPAIRMENTS: decreased activity tolerance, decreased endurance, decreased knowledge of condition, decreased ROM, decreased strength, decreased safety awareness, increased edema, impaired perceived functional ability, impaired UE functional use, postural dysfunction, and pain.   ACTIVITY LIMITATIONS: carrying, lifting, sleeping, and reach over head  PARTICIPATION LIMITATIONS: community activity and golf  PERSONAL FACTORS: Kidney stones, hernia repair, colon cancer are also affecting patient's functional outcome.   REHAB POTENTIAL: Good  CLINICAL DECISION MAKING: Stable/uncomplicated  EVALUATION COMPLEXITY: Low   GOALS: Goals reviewed with patient? Yes  SHORT TERM GOALS: Target date: 09/27/2023  Jack Gray will be independent with his day 1 HEP Baseline: Started 08/30/2023 Goal status: Met 09/05/2023  2.  Improve right shoulder active range of motion to 100% of the uninvolved left Baseline: See above, right side is more limited, particularly with the posterior capsule Goal status: INITIAL  3.  Improve right shoulder strength to at least 100% of the uninvolved left Baseline: See objective, right side is weaker Goal status: INITIAL   LONG TERM GOALS: Target date: 10/25/2023  Improve FOTO to 71 in 10 visits Baseline: Risk adjusted 54 Goal status: INITIAL  2.  Reade will report right shoulder pain consistently 0-3/10 on the Numeric Pain Rating Scale. Baseline: 1-6/10 Goal status: INITIAL  3.  Improve shoulder AROM for flexion to 170/170; IR to 60/60; ER to 90/90 and horizontal adduction to 40/40 Baseline: Flexion 160/160; IR 55/40; ER 95/85 and horizontal adduction 40/30 Goal status: INITIAL  4.  Improve shoulder strength for IR to  45/45 and ER to 30/30 Baseline: See objective Goal status: INITIAL  5.  Yadir will be independent with his long-term maintenance HEP at DC Baseline: Started 08/30/2023 Goal status: INITIAL   PLAN:  PT FREQUENCY: 1-2x/week  PT DURATION: 8 weeks  PLANNED INTERVENTIONS: 97110-Therapeutic exercises, 97530- Therapeutic activity, 97112- Neuromuscular re-education, 97535- Self Care, 16109- Manual therapy, Patient/Family education, Joint mobilization, and Cryotherapy  PLAN FOR NEXT SESSION: Review his stretches and exercises from day 1 and 2.  Appropriate specific rotator cuff and scapular strengthening activities for eventual return to golf, improved active range of motion, strength and less pain.  Prone progression after objective reassessment?   Cherlyn Cushing, PT, MPT 09/05/2023, 3:37 PM

## 2023-09-12 ENCOUNTER — Encounter: Payer: Self-pay | Admitting: Rehabilitative and Restorative Service Providers"

## 2023-09-12 ENCOUNTER — Ambulatory Visit (INDEPENDENT_AMBULATORY_CARE_PROVIDER_SITE_OTHER): Payer: Medicare Other | Admitting: Rehabilitative and Restorative Service Providers"

## 2023-09-12 DIAGNOSIS — M25611 Stiffness of right shoulder, not elsewhere classified: Secondary | ICD-10-CM

## 2023-09-12 DIAGNOSIS — M25511 Pain in right shoulder: Secondary | ICD-10-CM

## 2023-09-12 DIAGNOSIS — R293 Abnormal posture: Secondary | ICD-10-CM

## 2023-09-12 DIAGNOSIS — M6281 Muscle weakness (generalized): Secondary | ICD-10-CM

## 2023-09-12 DIAGNOSIS — G8929 Other chronic pain: Secondary | ICD-10-CM | POA: Diagnosis not present

## 2023-09-12 NOTE — Therapy (Signed)
 OUTPATIENT PHYSICAL THERAPY SHOULDER TREATMENT   Patient Name: Jack Gray MRN: [id number] DOB:Aug 20, 1945, 78 y.o., male Today's Date: 09/12/2023  END OF SESSION:  PT End of Session - 09/12/23 1431     Visit Number 3    Number of Visits 10    Date for PT Re-Evaluation 10/25/23    Authorization Type MEDICARE    Progress Note Due on Visit 10    PT Start Time 1345    PT Stop Time 1430    PT Time Calculation (min) 45 min    Activity Tolerance Patient tolerated treatment well;No increased pain    Behavior During Therapy [facility] for tasks assessed/performed               Past Medical History:  Diagnosis Date   Anemia    as a child   Cancer of sigmoid colon (HCC) 05/30/2017   Counseling regarding goals of care 06/18/2017   History of kidney stones    Pneumonia    as a child   Past Surgical History:  Procedure Laterality Date   CARDIAC CATHETERIZATION     at age 89-negative   cyctoscopy     for stones   EXTRACORPOREAL SHOCK WAVE LITHOTRIPSY Right 06/05/2019   Procedure: EXTRACORPOREAL SHOCK WAVE LITHOTRIPSY (ESWL);  Surgeon: Jack Glance, MD;  Location: [facility];  Service: Urology;  Laterality: Right;   HERNIA REPAIR     r and left inguinal  30 years ago   PARTIAL COLECTOMY Left 05/30/2017   Procedure: LAPAROSCOPIC ASSISTED LEFT COLECTOMY;  Surgeon: Jack Favorite, MD;  Location: [facility];  Service: General;  Laterality: Left;  GENERAL AND TAP BLOCK    PORTACATH PLACEMENT Right 06/11/2017   Procedure: INSERTION PORT-A-CATH;  Surgeon: Jack Favorite, MD;  Location: [facility];  Service: General;  Laterality: Right;   SHOULDER SURGERY     Bil   VIDEO ASSISTED THORACOSCOPY (VATS)/WEDGE RESECTION Left 08/08/2018   Procedure: VIDEO ASSISTED THORACOSCOPY (VATS)/WEDGE RESECTION LEFT LOWER LOBE, LYMPH NODE DISSECTION;  Surgeon: Jack Elspeth BROCKS, MD;  Location: [facility];  Service: Thoracic;  Laterality: Left;   Patient Active Problem List   Diagnosis Date Noted    Pain in right shoulder 02/15/2022   Solitary pulmonary nodule 11/07/2018   Lung mass 08/08/2018   Counseling regarding goals of care 06/18/2017   Cancer of sigmoid colon (HCC) 05/30/2017    PCP: Jack Pay, MD  REFERRING PROVIDER: Cordella Glendia Hutchinson, MD  REFERRING DIAG: Diagnosis M25.511 (ICD-10-CM) - Right shoulder pain, unspecified chronicity  THERAPY DIAG:  Abnormal posture  Stiffness of right shoulder, not elsewhere classified  Chronic right shoulder pain  Muscle weakness (generalized)  Rationale for Evaluation and Treatment: Rehabilitation  ONSET DATE: July 2023  SUBJECTIVE:  SUBJECTIVE STATEMENT: Jack Gray reports continued HEP compliance.  He feels stronger and looser since starting PT.  Jack Gray took a huge divot playing golf in July of 2023 which resulted in the original injury.  He is playing golf now but is fearful of taking another large divot and further injuring his shoulder.  He has a history of a previous right rotator cuff repair and left shoulder surgery.    Hand dominance: Right  PERTINENT HISTORY: Kidney stones, hernia repair, colon cancer  PAIN:  Are you having pain? Yes: NPRS scale: 1-6/10 this week Pain location: Rt shoulder Pain description: Quick jab, sharp pain that goes away quickly Aggravating factors: Extension, lifting with an extended elbow away from the body Relieving factors: Get out of painful positions  PRECAUTIONS: None  RED FLAGS: None   WEIGHT BEARING RESTRICTIONS: No  FALLS:  Has patient fallen in last 6 months? No  LIVING ENVIRONMENT: Lives with: lives with their family and lives with their spouse Lives in: House/apartment Stairs:  No issues Has following equipment at home: None  OCCUPATION: Retired  PLOF: Independent  PATIENT GOALS:  Return to golf, running errands, walking without being limited by pain  NEXT MD VISIT:   OBJECTIVE:  Note: Objective measures were completed at Evaluation unless otherwise noted.  DIAGNOSTIC FINDINGS:  IMPRESSION: 1. Partial thickness articular surface tears of the distal supraspinatus and upper infraspinatus tendons. I do not observe a definite full-thickness tear, but there is some contrast in the subacromial subdeltoid bursa. It looks like this patient was injected through the rotator interval, it is possible that this contrast leaked through the rotator interval into the subacromial subdeltoid bursa during injection rather than necessarily being from an occult full-thickness tear. 2. Moderate tendinopathy of the intra-articular segment of the biceps tendon. 3. Moderate degenerative chondral thinning in the glenohumeral joint. 4. Mild degenerative spurring in the acromioclavicular joint.    PATIENT SURVEYS:  FOTO risk adjusted 54 (Goal 71 in 10 visits)  COGNITION: Overall cognitive status: Within functional limits for tasks assessed     SENSATION: WFL  POSTURE: Slightly forward head, internally rotated and protracted shoulders  UPPER EXTREMITY ROM:   Passive ROM in degrees Left/Right 08/30/2023 Right 09/12/2023  Shoulder flexion 160/160 160  Shoulder extension    Shoulder abduction    Shoulder horizontal adduction 40/30 35  Shoulder internal rotation 55/40 60  Shoulder external rotation 95/85 90  Elbow flexion    Elbow extension    Wrist flexion    Wrist extension    Wrist ulnar deviation    Wrist radial deviation    Wrist pronation    Wrist supination    (Blank rows = not tested)  UPPER EXTREMITY STRENGTH:  In pounds assessed with hand-held-dynamometer Left/Right 08/30/2023   Shoulder flexion    Shoulder extension    Shoulder abduction    Shoulder adduction    Shoulder internal rotation 44.7/38.4   Shoulder external rotation 27.1/23.8   Middle trapezius     Lower trapezius    Elbow flexion    Elbow extension    Wrist flexion    Wrist extension    Wrist ulnar deviation    Wrist radial deviation    Wrist pronation    Wrist supination    Grip strength (lbs)    (Blank rows = not tested)  TREATMENT DATE:  09/12/2023 Scapular retraction/shoulder blade pinches 10 x 5 seconds HEP only Supine shoulder internal rotation stretch 10 x 10 seconds Posterior capsule stretch 10 x 10 seconds Thumb up the back/shoulder IR stretch 10 x 10 seconds Thera-Band ER Blue 2 sets of 10 with slow eccentrics Thera-Band IR Blue 10 x slow eccentrics Scapular protraction with Blue Thera-Band 20 x 3 seconds  Functional Activities:  Body blade IR/ER; extended arm protract/retract and abduction/adduction for golf swing 2 x 20 seconds each Supine shoulder flexion for reaching and overhead function (palms in, protract 1st, elbows in by ears) 10 x 10 seconds   09/05/2023 Scapular retraction/shoulder blade pinches 10 x 5 seconds Supine shoulder internal rotation stretch 10 x 10 seconds Posterior capsule stretch 10 x 10 seconds Thumb up the back/shoulder IR stretch 10 x 10 seconds Thera-Band ER Blue 2 sets of 10 with slow eccentrics Thera-Band IR Blue 10 x slow eccentrics Scapular protraction with Blue Thera-Band 20 x 3 seconds  Functional Activities: Reviewed Jack Gray's old strengthening program with corrective feedback given Body blade IR/ER and abduction/adduction for golf swing 2 x 20 seconds each   08/30/2023 Scapular retraction/shoulder blade pinches 10 x 5 seconds Supine shoulder internal rotation stretch 10 x 10 seconds Posterior capsule stretch 10 x 10 seconds Thumb up the back/shoulder IR stretch 10 x 10 seconds  Functional Activities: Reviewed Jack Gray's imaging with the shoulder model and explained the anatomy and biomechanics of the  shoulder.  Reviewed examination findings, expectations post physical therapy and long-term prognosis which is good as long as he keeps up with his exercises and avoids aggravating factors like quick jerking movements and lifting with elbow extended away from the body.  PATIENT EDUCATION: Education details: See above Person educated: Patient Education method: Explanation, Demonstration, Tactile cues, Verbal cues, and Handouts Education comprehension: verbalized understanding, returned demonstration, verbal cues required, tactile cues required, and needs further education  HOME EXERCISE PROGRAM: Access Code: [email or web address] URL: [email or web address] Date: 09/12/2023 Prepared by: Lamar Ivory  Exercises - Supine Shoulder Internal Rotation Stretch  - 1 x daily - 7 x weekly - 1 sets - 20 reps - 10 seconds hold - Standing Shoulder Posterior Capsule Stretch  - 2 x daily - 7 x weekly - 1 sets - 10 reps - 10 seconds hold - Standing Shoulder Internal Rotation Stretch with Hands Behind Back  - 1 x daily - 7 x weekly - 1 sets - 10 reps - 10 seconds hold - Standing Scapular Retraction  - 5 x daily - 7 x weekly - 1 sets - 5 reps - 5 second hold - Supine Scapular Protraction in Flexion with Dumbbells  - 1 x daily - 7 x weekly - 1 sets - 20 reps - 3 seconds hold - Shoulder External Rotation with Anchored Resistance  - 1-2 x daily - 7 x weekly - 2 sets - 10 reps - 3 hold - Shoulder Internal Rotation with Resistance  - 1-2 x daily - 7 x weekly - 1 sets - 10 reps - Supine Shoulder Flexion Extension Full Range AROM  - 1-2 x daily - 7 x weekly - 1 sets - 10 reps - 10 seconds hold  ASSESSMENT:  CLINICAL IMPRESSION: Jack Gray notes feeling stronger and more flexible with 2 weeks of supervised PT.  He looks good with his current program and I recommended he cancel Friday and we will follow-up next week where I will objectively measure RTC strength.  Continue appropriate AROM, strength and functional  progressions  to meet long-term goals.   Patient is a 78 y.o. male who was seen today for physical therapy evaluation and treatment for Diagnosis M25.511 (ICD-10-CM) - Right shoulder pain, unspecified chronicity.  Jack Gray had a previous right rotator cuff repair and the current episode originated in July 2023 when playing golf.  Jack Gray would like to avoid another surgery while still being being able to play golf and do his normal functional activities without pain.  Overall, right shoulder strength is not bad, but scapular and rotator cuff strength could improve and his capsular flexibility is quite limited, particularly in the posterior capsule.  This was the focus of today's activities as Jack Gray reports he has been doing his Thera-Band activities since surgery on a regular basis.  He will still benefit from more specific scapular and rotator cuff strengthening activities and is a good candidate for supervised physical therapy.  OBJECTIVE IMPAIRMENTS: decreased activity tolerance, decreased endurance, decreased knowledge of condition, decreased ROM, decreased strength, decreased safety awareness, increased edema, impaired perceived functional ability, impaired UE functional use, postural dysfunction, and pain.   ACTIVITY LIMITATIONS: carrying, lifting, sleeping, and reach over head  PARTICIPATION LIMITATIONS: community activity and golf  PERSONAL FACTORS: Kidney stones, hernia repair, colon cancer are also affecting patient's functional outcome.   REHAB POTENTIAL: Good  CLINICAL DECISION MAKING: Stable/uncomplicated  EVALUATION COMPLEXITY: Low   GOALS: Goals reviewed with patient? Yes  SHORT TERM GOALS: Target date: 09/27/2023  Jack Gray will be independent with his day 1 HEP Baseline: Started 08/30/2023 Goal status: Met 09/05/2023  2.  Improve right shoulder active range of motion to 100% of the uninvolved left Baseline: See above, right side is more limited, particularly with the posterior  capsule Goal status: Partially Met 09/12/2023  3.  Improve right shoulder strength to at least 100% of the uninvolved left Baseline: See objective, right side is weaker Goal status: INITIAL   LONG TERM GOALS: Target date: 10/25/2023  Improve FOTO to 71 in 10 visits Baseline: Risk adjusted 54 Goal status: INITIAL  2.  [place] will report right shoulder pain consistently 0-3/10 on the Numeric Pain Rating Scale. Baseline: 1-6/10 Goal status: INITIAL  3.  Improve shoulder AROM for flexion to 170/170; IR to 60/60; ER to 90/90 and horizontal adduction to 40/40 Baseline: Flexion 160/160; IR 55/40; ER 95/85 and horizontal adduction 40/30 Goal status: Partially Met 09/12/2023  4.  Improve shoulder strength for IR to 45/45 and ER to 30/30 Baseline: See objective Goal status: INITIAL  5.  Karel will be independent with his long-term maintenance HEP at DC Baseline: Started 08/30/2023 Goal status: INITIAL   PLAN:  PT FREQUENCY: 1-2x/week  PT DURATION: 8 weeks  PLANNED INTERVENTIONS: 97110-Therapeutic exercises, 97530- Therapeutic activity, 97112- Neuromuscular re-education, 97535- Self Care, [id number]- Manual therapy, Patient/Family education, Joint mobilization, and Cryotherapy  PLAN FOR NEXT SESSION: Appropriate specific rotator cuff and scapular strengthening activities for eventual return to golf, improved active range of motion, strength and less pain.  Prone progression after objective strength reassessment.   Myer LELON Ivory, PT, MPT 09/12/2023, 3:32 PM

## 2023-09-14 ENCOUNTER — Encounter: Payer: Medicare Other | Admitting: Rehabilitative and Restorative Service Providers"

## 2023-09-19 ENCOUNTER — Ambulatory Visit (INDEPENDENT_AMBULATORY_CARE_PROVIDER_SITE_OTHER): Payer: Medicare Other | Admitting: Rehabilitative and Restorative Service Providers"

## 2023-09-19 ENCOUNTER — Encounter: Payer: Self-pay | Admitting: Rehabilitative and Restorative Service Providers"

## 2023-09-19 DIAGNOSIS — M25611 Stiffness of right shoulder, not elsewhere classified: Secondary | ICD-10-CM | POA: Diagnosis not present

## 2023-09-19 DIAGNOSIS — M25511 Pain in right shoulder: Secondary | ICD-10-CM

## 2023-09-19 DIAGNOSIS — M6281 Muscle weakness (generalized): Secondary | ICD-10-CM | POA: Diagnosis not present

## 2023-09-19 DIAGNOSIS — G8929 Other chronic pain: Secondary | ICD-10-CM

## 2023-09-19 DIAGNOSIS — R293 Abnormal posture: Secondary | ICD-10-CM | POA: Diagnosis not present

## 2023-09-19 NOTE — Therapy (Signed)
OUTPATIENT PHYSICAL THERAPY SHOULDER TREATMENT  Patient Name: Jack Gray MRN: [id number] DOB:1946-07-06, 78 y.o., male Today's Date: 09/19/2023  END OF SESSION:  PT End of Session - 09/19/23 1649     Visit Number 4    Number of Visits 10    Date for PT Re-Evaluation 10/25/23    Authorization Type MEDICARE    Progress Note Due on Visit 10    PT Start Time 1600    PT Stop Time 1645    PT Time Calculation (min) 45 min    Activity Tolerance Patient tolerated treatment well;No increased pain    Behavior During Therapy [facility] for tasks assessed/performed              Past Medical History:  Diagnosis Date   Anemia    as a child   Cancer of sigmoid colon (HCC) 05/30/2017   Counseling regarding goals of care 06/18/2017   History of kidney stones    Pneumonia    as a child   Past Surgical History:  Procedure Laterality Date   CARDIAC CATHETERIZATION     at age 26-negative   cyctoscopy     for stones   EXTRACORPOREAL SHOCK WAVE LITHOTRIPSY Right 06/05/2019   Procedure: EXTRACORPOREAL SHOCK WAVE LITHOTRIPSY (ESWL);  Surgeon: Heloise Purpura, MD;  Location: [facility];  Service: Urology;  Laterality: Right;   HERNIA REPAIR     r and left inguinal  30 years ago   PARTIAL COLECTOMY Left 05/30/2017   Procedure: LAPAROSCOPIC ASSISTED LEFT COLECTOMY;  Surgeon: Claud Kelp, MD;  Location: [facility];  Service: General;  Laterality: Left;  GENERAL AND TAP BLOCK    PORTACATH PLACEMENT Right 06/11/2017   Procedure: INSERTION PORT-A-CATH;  Surgeon: Claud Kelp, MD;  Location:  SURGERY CENTER;  Service: General;  Laterality: Right;   SHOULDER SURGERY     Bil   VIDEO ASSISTED THORACOSCOPY (VATS)/WEDGE RESECTION Left 08/08/2018   Procedure: VIDEO ASSISTED THORACOSCOPY (VATS)/WEDGE RESECTION LEFT LOWER LOBE, LYMPH NODE DISSECTION;  Surgeon: Loreli Slot, MD;  Location: [facility] OR;  Service: Thoracic;  Laterality: Left;   Patient Active Problem List   Diagnosis Date Noted    Pain in right shoulder 02/15/2022   Solitary pulmonary nodule 11/07/2018   Lung mass 08/08/2018   Counseling regarding goals of care 06/18/2017   Cancer of sigmoid colon (HCC) 05/30/2017    PCP: Gweneth Dimitri, MD  REFERRING PROVIDER: Cammy Copa, MD  REFERRING DIAG: Diagnosis M25.511 (ICD-10-CM) - Right shoulder pain, unspecified chronicity  THERAPY DIAG:  Abnormal posture  Stiffness of right shoulder, not elsewhere classified  Chronic right shoulder pain  Muscle weakness (generalized)  Rationale for Evaluation and Treatment: Rehabilitation  ONSET DATE: July 2023  SUBJECTIVE:  SUBJECTIVE STATEMENT: [name] notes better strength and range since starting PT.  [name] took a huge divot playing golf in July of 2023 which resulted in the original injury.  He is playing golf now but is fearful of taking another large divot and further injuring his shoulder.  He has a history of a previous right rotator cuff repair and left shoulder surgery.    Hand dominance: Right  PERTINENT HISTORY: Kidney stones, hernia repair, colon cancer  PAIN:  Are you having pain? Yes: NPRS scale: 0-6/10 (was 1-6/10) the past 4 days Pain location: Rt shoulder Pain description: Quick jab, sharp pain that goes away quickly Aggravating factors: Extension, lifting with an extended elbow away from the body Relieving factors: Get out of painful positions  PRECAUTIONS: None  RED FLAGS: None   WEIGHT BEARING RESTRICTIONS: No  FALLS:  Has patient fallen in last 6 months? No  LIVING ENVIRONMENT: Lives with: lives with their family and lives with their spouse Lives in: House/apartment Stairs:  No issues Has following equipment at home: None  OCCUPATION: Retired  PLOF: Independent  PATIENT GOALS: Return to golf,  running errands, walking without being limited by pain  NEXT MD VISIT:   OBJECTIVE:  Note: Objective measures were completed at Evaluation unless otherwise noted.  DIAGNOSTIC FINDINGS:  IMPRESSION: 1. Partial thickness articular surface tears of the distal supraspinatus and upper infraspinatus tendons. I do not observe a definite full-thickness tear, but there is some contrast in the subacromial subdeltoid bursa. It looks like this patient was injected through the rotator interval, it is possible that this contrast leaked through the rotator interval into the subacromial subdeltoid bursa during injection rather than necessarily being from an occult full-thickness tear. 2. Moderate tendinopathy of the intra-articular segment of the biceps tendon. 3. Moderate degenerative chondral thinning in the glenohumeral joint. 4. Mild degenerative spurring in the acromioclavicular joint.    PATIENT SURVEYS:  FOTO risk adjusted 54 (Goal 71 in 10 visits)  COGNITION: Overall cognitive status: Within functional limits for tasks assessed     SENSATION: WFL  POSTURE: Slightly forward head, internally rotated and protracted shoulders  UPPER EXTREMITY ROM:   Passive ROM in degrees Left/Right 08/30/2023 Right 09/12/2023 Right 09/19/2023  Shoulder flexion 160/160 160 165  Shoulder extension     Shoulder abduction     Shoulder horizontal adduction 40/30 35 35  Shoulder internal rotation 55/40 60 55  Shoulder external rotation 95/85 90 90  Elbow flexion     Elbow extension     Wrist flexion     Wrist extension     Wrist ulnar deviation     Wrist radial deviation     Wrist pronation     Wrist supination     (Blank rows = not tested)  UPPER EXTREMITY STRENGTH:  In pounds assessed with hand-held-dynamometer Left/Right 08/30/2023 Right 09/19/2023  Shoulder flexion    Shoulder extension    Shoulder abduction    Shoulder adduction    Shoulder internal rotation 44.7/38.4 40.5  Shoulder  external rotation 27.1/23.8 25.8  Middle trapezius    Lower trapezius    Elbow flexion    Elbow extension    Wrist flexion    Wrist extension    Wrist ulnar deviation    Wrist radial deviation    Wrist pronation    Wrist supination    Grip strength (lbs)    (Blank rows = not tested)  TREATMENT DATE:  09/19/2023 Scapular retraction/shoulder blade pinches 10 x 5 seconds HEP only Supine shoulder internal rotation stretch 10 x 10 seconds Posterior capsule stretch 10 x 10 seconds Thumb up the back/shoulder IR stretch 10 x 10 seconds Thera-Band ER Blue 2 sets of 10 with slow eccentrics (HEP only) Thera-Band IR Blue 10 x slow eccentrics (HEP only) Scapular protraction with Blue Thera-Band 20 x 3 seconds (HEP only)  Functional Activities:  Extended education reviewing imaging and shoulder model and comparing this to objective values and symptoms Supine shoulder flexion for reaching and overhead function (palms in, protract 1st, elbows in by ears) 10 x 10 seconds   09/12/2023 Scapular retraction/shoulder blade pinches 10 x 5 seconds HEP only Supine shoulder internal rotation stretch 10 x 10 seconds Posterior capsule stretch 10 x 10 seconds Thumb up the back/shoulder IR stretch 10 x 10 seconds Thera-Band ER Blue 2 sets of 10 with slow eccentrics Thera-Band IR Blue 10 x slow eccentrics Scapular protraction with Blue Thera-Band 20 x 3 seconds  Functional Activities:  Body blade IR/ER; extended arm protract/retract and abduction/adduction for golf swing 2 x 20 seconds each Supine shoulder flexion for reaching and overhead function (palms in, protract 1st, elbows in by ears) 10 x 10 seconds   09/05/2023 Scapular retraction/shoulder blade pinches 10 x 5 seconds Supine shoulder internal rotation stretch 10 x 10 seconds Posterior capsule stretch 10 x 10 seconds Thumb up  the back/shoulder IR stretch 10 x 10 seconds Thera-Band ER Blue 2 sets of 10 with slow eccentrics Thera-Band IR Blue 10 x slow eccentrics Scapular protraction with Blue Thera-Band 20 x 3 seconds  Functional Activities: Reviewed [name]'s old strengthening program with corrective feedback given Body blade IR/ER and abduction/adduction for golf swing 2 x 20 seconds each  PATIENT EDUCATION: Education details: See above Person educated: Patient Education method: Explanation, Demonstration, Tactile cues, Verbal cues, and Handouts Education comprehension: verbalized understanding, returned demonstration, verbal cues required, tactile cues required, and needs further education  HOME EXERCISE PROGRAM: Access Code: [email or web address] URL: [email or web address] Date: 09/19/2023 Prepared by: Pauletta Browns  Exercises - Supine Shoulder Internal Rotation Stretch  - 2 x daily - 7 x weekly - 1 sets - 20 reps - 10 seconds hold - Standing Shoulder Posterior Capsule Stretch  - 2 x daily - 7 x weekly - 1 sets - 10 reps - 10 seconds hold - Standing Shoulder Internal Rotation Stretch with Hands Behind Back  - 2 x daily - 7 x weekly - 1 sets - 10 reps - 10 seconds hold - Standing Scapular Retraction  - 5 x daily - 7 x weekly - 1 sets - 5 reps - 5 second hold - Supine Scapular Protraction in Flexion with Dumbbells  - 2 x daily - 7 x weekly - 1 sets - 20 reps - 3 seconds hold - Shoulder External Rotation with Anchored Resistance  - 2 x daily - 7 x weekly - 2 sets - 10 reps - 3 hold - Shoulder Internal Rotation with Resistance  - 2 x daily - 7 x weekly - 1 sets - 10 reps - Supine Shoulder Flexion Extension Full Range AROM  - 2 x daily - 7 x weekly - 1 sets - 10 reps - 10 seconds hold  ASSESSMENT:  CLINICAL IMPRESSION: [name] has better AROM and strength as compared to evaluation.  Flexion range, posterior capsule flexibility and objective rotator cuff strength will benefit from additional time and  attention.  Patient is a 78 y.o.  male who was seen today for physical therapy evaluation and treatment for Diagnosis M25.511 (ICD-10-CM) - Right shoulder pain, unspecified chronicity.  [name] had a previous right rotator cuff repair and the current episode originated in July 2023 when playing golf.  [name] would like to avoid another surgery while still being being able to play golf and do his normal functional activities without pain.  Overall, right shoulder strength is not bad, but scapular and rotator cuff strength could improve and his capsular flexibility is quite limited, particularly in the posterior capsule.  This was the focus of today's activities as [name] reports he has been doing his Thera-Band activities since surgery on a regular basis.  He will still benefit from more specific scapular and rotator cuff strengthening activities and is a good candidate for supervised physical therapy.  OBJECTIVE IMPAIRMENTS: decreased activity tolerance, decreased endurance, decreased knowledge of condition, decreased ROM, decreased strength, decreased safety awareness, increased edema, impaired perceived functional ability, impaired UE functional use, postural dysfunction, and pain.   ACTIVITY LIMITATIONS: carrying, lifting, sleeping, and reach over head  PARTICIPATION LIMITATIONS: community activity and golf  PERSONAL FACTORS: Kidney stones, hernia repair, colon cancer are also affecting patient's functional outcome.   REHAB POTENTIAL: Good  CLINICAL DECISION MAKING: Stable/uncomplicated  EVALUATION COMPLEXITY: Low   GOALS: Goals reviewed with patient? Yes  SHORT TERM GOALS: Target date: 09/27/2023  [name] will be independent with his day 1 HEP Baseline: Started 08/30/2023 Goal status: Met 09/05/2023  2.  Improve right shoulder active range of motion to 100% of the uninvolved left Baseline: See above, right side is more limited, particularly with the posterior capsule Goal status:  Partially Met 09/19/2023  3.  Improve right shoulder strength to at least 100% of the uninvolved left Baseline: See objective, right side is weaker Goal status: On Going 09/19/2023   LONG TERM GOALS: Target date: 10/25/2023  Improve FOTO to 71 in 10 visits Baseline: Risk adjusted 54 Goal status: INITIAL  2.  Earnestine will report right shoulder pain consistently 0-3/10 on the Numeric Pain Rating Scale. Baseline: 1-6/10 Goal status: On Going 09/19/2023  3.  Improve shoulder AROM for flexion to 170/170; IR to 60/60; ER to 90/90 and horizontal adduction to 40/40 Baseline: Flexion 160/160; IR 55/40; ER 95/85 and horizontal adduction 40/30 Goal status: Partially Met 09/19/2023  4.  Improve shoulder strength for IR to 45/45 and ER to 30/30 Baseline: See objective Goal status: On Going 09/19/2023  5.  Zorion will be independent with his long-term maintenance HEP at DC Baseline: Started 08/30/2023 Goal status: INITIAL   PLAN:  PT FREQUENCY: 1-2x/week  PT DURATION: 8 weeks  PLANNED INTERVENTIONS: 97110-Therapeutic exercises, 97530- Therapeutic activity, 97112- Neuromuscular re-education, 97535- Self Care, 16109- Manual therapy, Patient/Family education, Joint mobilization, and Cryotherapy  PLAN FOR NEXT SESSION: Prone progression.  Cherlyn Cushing, PT, MPT 09/19/2023, 4:58 PM

## 2023-09-21 ENCOUNTER — Encounter: Payer: Medicare Other | Admitting: Rehabilitative and Restorative Service Providers"

## 2023-09-27 ENCOUNTER — Encounter: Payer: Medicare Other | Admitting: Rehabilitative and Restorative Service Providers"

## 2023-10-10 ENCOUNTER — Ambulatory Visit (INDEPENDENT_AMBULATORY_CARE_PROVIDER_SITE_OTHER): Payer: Medicare Other | Admitting: Rehabilitative and Restorative Service Providers"

## 2023-10-10 ENCOUNTER — Encounter: Payer: Self-pay | Admitting: Rehabilitative and Restorative Service Providers"

## 2023-10-10 DIAGNOSIS — R293 Abnormal posture: Secondary | ICD-10-CM | POA: Diagnosis not present

## 2023-10-10 DIAGNOSIS — G8929 Other chronic pain: Secondary | ICD-10-CM | POA: Diagnosis not present

## 2023-10-10 DIAGNOSIS — M25611 Stiffness of right shoulder, not elsewhere classified: Secondary | ICD-10-CM

## 2023-10-10 DIAGNOSIS — M25511 Pain in right shoulder: Secondary | ICD-10-CM | POA: Diagnosis not present

## 2023-10-10 DIAGNOSIS — M6281 Muscle weakness (generalized): Secondary | ICD-10-CM | POA: Diagnosis not present

## 2023-10-10 NOTE — Therapy (Signed)
 OUTPATIENT PHYSICAL THERAPY SHOULDER TREATMENT  Patient Name: Jack Gray MRN: [id number] DOB:07-27-1946, 78 y.o., male Today's Date: 10/10/2023  END OF SESSION:  PT End of Session - 10/10/23 1300     Visit Number 5    Number of Visits 10    Date for PT Re-Evaluation 10/25/23    Authorization Type MEDICARE    Progress Note Due on Visit 10    PT Start Time 1300    PT Stop Time 1402    PT Time Calculation (min) 62 min    Activity Tolerance Patient tolerated treatment well;No increased pain    Behavior During Therapy [facility] for tasks assessed/performed             Past Medical History:  Diagnosis Date   Anemia    as a child   Cancer of sigmoid colon (HCC) 05/30/2017   Counseling regarding goals of care 06/18/2017   History of kidney stones    Pneumonia    as a child   Past Surgical History:  Procedure Laterality Date   CARDIAC CATHETERIZATION     at age 2-negative   cyctoscopy     for stones   EXTRACORPOREAL SHOCK WAVE LITHOTRIPSY Right 06/05/2019   Procedure: EXTRACORPOREAL SHOCK WAVE LITHOTRIPSY (ESWL);  Surgeon: Heloise Purpura, MD;  Location: [facility];  Service: Urology;  Laterality: Right;   HERNIA REPAIR     r and left inguinal  30 years ago   PARTIAL COLECTOMY Left 05/30/2017   Procedure: LAPAROSCOPIC ASSISTED LEFT COLECTOMY;  Surgeon: Claud Kelp, MD;  Location: [facility];  Service: General;  Laterality: Left;  GENERAL AND TAP BLOCK    PORTACATH PLACEMENT Right 06/11/2017   Procedure: INSERTION PORT-A-CATH;  Surgeon: Claud Kelp, MD;  Location: [facility];  Service: General;  Laterality: Right;   SHOULDER SURGERY     Bil   VIDEO ASSISTED THORACOSCOPY (VATS)/WEDGE RESECTION Left 08/08/2018   Procedure: VIDEO ASSISTED THORACOSCOPY (VATS)/WEDGE RESECTION LEFT LOWER LOBE, LYMPH NODE DISSECTION;  Surgeon: Loreli Slot, MD;  Location: [facility] OR;  Service: Thoracic;  Laterality: Left;   Patient Active Problem List   Diagnosis Date Noted   Pain  in right shoulder 02/15/2022   Solitary pulmonary nodule 11/07/2018   Lung mass 08/08/2018   Counseling regarding goals of care 06/18/2017   Cancer of sigmoid colon (HCC) 05/30/2017    PCP: Gweneth Dimitri, MD  REFERRING PROVIDER: Cammy Copa, MD  REFERRING DIAG: Diagnosis M25.511 (ICD-10-CM) - Right shoulder pain, unspecified chronicity  THERAPY DIAG:  Abnormal posture  Stiffness of right shoulder, not elsewhere classified  Chronic right shoulder pain  Muscle weakness (generalized)  Rationale for Evaluation and Treatment: Rehabilitation  ONSET DATE: July 2023  SUBJECTIVE:  SUBJECTIVE STATEMENT: Jack Gray notes better strength and range since starting PT.  "It's about 40% better."  He reports 1 x a day HEP compliance while in [place] last week.  He typically sleeps good, although some aching if he gets up at night to pee.  Golf 1 x without problems this week while the 2nd time resulted in some soreness.  Jack Gray took a huge divot playing golf in July of 2023 which resulted in the original injury.  He is playing golf now but is fearful of taking another large divot and further injuring his shoulder.  He has a history of a previous right rotator cuff repair and left shoulder surgery.    Hand dominance: Right  PERTINENT HISTORY: Kidney stones, hernia repair, colon cancer  PAIN:  Are you having pain? Yes: NPRS scale: 0-3/10 (was 1-6/10) the past 7 days Pain location: Rt shoulder Pain description: Quick jab, sharp pain that goes away quickly Aggravating factors: Extension, lifting with an extended elbow away from the body Relieving factors: Get out of painful positions  PRECAUTIONS: None  RED FLAGS: None   WEIGHT BEARING RESTRICTIONS: No  FALLS:  Has patient fallen in last 6 months?  No  LIVING ENVIRONMENT: Lives with: lives with their family and lives with their spouse Lives in: House/apartment Stairs:  No issues Has following equipment at home: None  OCCUPATION: Retired  PLOF: Independent  PATIENT GOALS: Return to golf, running errands, walking without being limited by pain  NEXT MD VISIT:   OBJECTIVE:  Note: Objective measures were completed at Evaluation unless otherwise noted.  DIAGNOSTIC FINDINGS:  IMPRESSION: 1. Partial thickness articular surface tears of the distal supraspinatus and upper infraspinatus tendons. I do not observe a definite full-thickness tear, but there is some contrast in the subacromial subdeltoid bursa. It looks like this patient was injected through the rotator interval, it is possible that this contrast leaked through the rotator interval into the subacromial subdeltoid bursa during injection rather than necessarily being from an occult full-thickness tear. 2. Moderate tendinopathy of the intra-articular segment of the biceps tendon. 3. Moderate degenerative chondral thinning in the glenohumeral joint. 4. Mild degenerative spurring in the acromioclavicular joint.    PATIENT SURVEYS:  FOTO risk adjusted 54 (Goal 71 in 10 visits)  COGNITION: Overall cognitive status: Within functional limits for tasks assessed     SENSATION: WFL  POSTURE: Slightly forward head, internally rotated and protracted shoulders  UPPER EXTREMITY ROM:   Passive ROM in degrees Left/Right 08/30/2023 Right 09/12/2023 Right 09/19/2023 Right 10/10/2023  Shoulder flexion 160/160 160 165 170  Shoulder extension      Shoulder abduction      Shoulder horizontal adduction 40/30 35 35 40  Shoulder internal rotation 55/40 60 55 55  Shoulder external rotation 95/85 90 90 100  Elbow flexion      Elbow extension      Wrist flexion      Wrist extension      Wrist ulnar deviation      Wrist radial deviation      Wrist pronation      Wrist supination       (Blank rows = not tested)  UPPER EXTREMITY STRENGTH:  In pounds assessed with hand-held-dynamometer Left/Right 08/30/2023 Right 09/19/2023 Right 10/10/2023  Shoulder flexion     Shoulder extension     Shoulder abduction     Shoulder adduction     Shoulder internal rotation 44.7/38.4 40.5 41.5  Shoulder external rotation 27.1/23.8 25.8 25.6  Middle trapezius  Lower trapezius     Elbow flexion     Elbow extension     Wrist flexion     Wrist extension     Wrist ulnar deviation     Wrist radial deviation     Wrist pronation     Wrist supination     Grip strength (lbs)     (Blank rows = not tested)                                                                                                                            TREATMENT DATE:  10/10/2023 Scapular retraction/shoulder blade pinches 10 x 5 seconds HEP only Supine shoulder internal rotation stretch 20 x 10 seconds Posterior capsule stretch 10 x 10 seconds HEP only Thumb up the back/shoulder IR stretch 10 x 10 seconds Thera-Band ER Blue 2 sets of 10 with slow eccentrics  Thera-Band IR Blue 10 x slow eccentrics Scapular protraction with Blue Thera-Band 20 x 3 seconds (HEP only) Rows with Blue Thera-Band 30 x 3 seconds Prone 90 & 100 degrees 10 x 3 seconds each  Functional Activities:  Supine arm raises/scapular protraction 30 x 10 # (for reaching) Supine shoulder flexion for reaching and overhead function (palms in, protract 1st, elbows in by ears) 10 x 10 seconds Reviewed shoulder mechanics and the importance of slow eccentrics, rest and avoiding overuse   09/19/2023 Scapular retraction/shoulder blade pinches 10 x 5 seconds HEP only Supine shoulder internal rotation stretch 10 x 10 seconds Posterior capsule stretch 10 x 10 seconds Thumb up the back/shoulder IR stretch 10 x 10 seconds Thera-Band ER Blue 2 sets of 10 with slow eccentrics (HEP only) Thera-Band IR Blue 10 x slow eccentrics (HEP only) Scapular protraction  with Blue Thera-Band 20 x 3 seconds (HEP only)  Functional Activities:  Extended education reviewing imaging and shoulder model and comparing this to objective values and symptoms Supine shoulder flexion for reaching and overhead function (palms in, protract 1st, elbows in by ears) 10 x 10 seconds   09/12/2023 Scapular retraction/shoulder blade pinches 10 x 5 seconds HEP only Supine shoulder internal rotation stretch 10 x 10 seconds Posterior capsule stretch 10 x 10 seconds Thumb up the back/shoulder IR stretch 10 x 10 seconds Thera-Band ER Blue 2 sets of 10 with slow eccentrics Thera-Band IR Blue 10 x slow eccentrics Scapular protraction with Blue Thera-Band 20 x 3 seconds  Functional Activities:  Body blade IR/ER; extended arm protract/retract and abduction/adduction for golf swing 2 x 20 seconds each Supine shoulder flexion for reaching and overhead function (palms in, protract 1st, elbows in by ears) 10 x 10 seconds   PATIENT EDUCATION: Education details: See above Person educated: Patient Education method: Explanation, Demonstration, Tactile cues, Verbal cues, and Handouts Education comprehension: verbalized understanding, returned demonstration, verbal cues required, tactile cues required, and needs further education  HOME EXERCISE PROGRAM: Access Code: [email or web address] URL: [email or web address] Date: 10/10/2023 Prepared by: Pauletta Browns  Exercises - Supine Shoulder  Internal Rotation Stretch  - 1 x daily - 7 x weekly - 1 sets - 20 reps - 10 seconds hold - Standing Shoulder Posterior Capsule Stretch  - 1 x daily - 3 x weekly - 1 sets - 5-10 reps - 10 seconds hold - Standing Shoulder Internal Rotation Stretch with Hands Behind Back  - 1 x daily - 7 x weekly - 1 sets - 10 reps - 10 seconds hold - Standing Scapular Retraction  - 5 x daily - 7 x weekly - 1 sets - 5 reps - 5 second hold - Supine Scapular Protraction in Flexion with Dumbbells  - 1 x daily - 3 x weekly - 1  sets - 30 reps - 3 seconds hold - Shoulder External Rotation with Anchored Resistance  - 1 x daily - 3-4 x weekly - 2 sets - 10 reps - 3 hold - Shoulder Internal Rotation with Resistance  - 1 x daily - 3-4 x weekly - 1 sets - 10 reps - Supine Shoulder Flexion Extension Full Range AROM  - 1 x daily - 3 x weekly - 1 sets - 10 reps - 10 seconds hold - Prone Shoulder Horizontal Abduction with Thumbs Up  - 1 x daily - 7 x weekly - 1-2 sets - 10 reps - 3 seconds hold - Standing Shoulder Row with Anchored Resistance  - 1 x daily - 3 x weekly - 1 sets - 30 reps - 3 seconds hold  ASSESSMENT:  CLINICAL IMPRESSION: Jack Gray reports he is "40% better" as compared to before starting PT.  ER:IR strength ratio is still shy of the 2:3 ratio we are looking for (67% Goal, currently 61%).  I made some changes to the strength components of Jack Gray's program to address this and we will follow-up with a strength check in 2-3 weeks to assess progress.  Patient is a 78 y.o. male who was seen today for physical therapy evaluation and treatment for Diagnosis M25.511 (ICD-10-CM) - Right shoulder pain, unspecified chronicity.  Jack Gray had a previous right rotator cuff repair and the current episode originated in July 2023 when playing golf.  Jack Gray would like to avoid another surgery while still being being able to play golf and do his normal functional activities without pain.  Overall, right shoulder strength is not bad, but scapular and rotator cuff strength could improve and his capsular flexibility is quite limited, particularly in the posterior capsule.  This was the focus of today's activities as Jack Gray reports he has been doing his Thera-Band activities since surgery on a regular basis.  He will still benefit from more specific scapular and rotator cuff strengthening activities and is a good candidate for supervised physical therapy.  OBJECTIVE IMPAIRMENTS: decreased activity tolerance, decreased endurance, decreased  knowledge of condition, decreased ROM, decreased strength, decreased safety awareness, increased edema, impaired perceived functional ability, impaired UE functional use, postural dysfunction, and pain.   ACTIVITY LIMITATIONS: carrying, lifting, sleeping, and reach over head  PARTICIPATION LIMITATIONS: community activity and golf  PERSONAL FACTORS: Kidney stones, hernia repair, colon cancer are also affecting patient's functional outcome.   REHAB POTENTIAL: Good  CLINICAL DECISION MAKING: Stable/uncomplicated  EVALUATION COMPLEXITY: Low   GOALS: Goals reviewed with patient? Yes  SHORT TERM GOALS: Target date: 09/27/2023  Jack Gray will be independent with his day 1 HEP Baseline: Started 08/30/2023 Goal status: Met 09/05/2023  2.  Improve right shoulder active range of motion to 100% of the uninvolved left Baseline: See above, right side is more  limited, particularly with the posterior capsule Goal status: Met 10/10/2023  3.  Improve right shoulder strength to at least 100% of the uninvolved left Baseline: See objective, right side is weaker Goal status: On Going 10/10/2023   LONG TERM GOALS: Target date: 10/25/2023  Improve FOTO to 71 in 10 visits Baseline: Risk adjusted 54 Goal status: INITIAL  2.  Kaamil will report right shoulder pain consistently 0-3/10 on the Numeric Pain Rating Scale. Baseline: 1-6/10 Goal status: Met 10/10/2023  3.  Improve shoulder AROM for flexion to 170/170; IR to 60/60; ER to 90/90 and horizontal adduction to 40/40 Baseline: Flexion 160/160; IR 55/40; ER 95/85 and horizontal adduction 40/30 Goal status: Partially Met 10/10/2023  4.  Improve shoulder strength for IR to 45/45 and ER to 30/30 Baseline: See objective Goal status: On Going 10/10/2023  5.  Kaedyn will be independent with his long-term maintenance HEP at DC Baseline: Started 08/30/2023 Goal status: INITIAL   PLAN:  PT FREQUENCY: Follow-up in 2-3 weeks  PT DURATION: 1 visit  PLANNED  INTERVENTIONS: 97110-Therapeutic exercises, 97530- Therapeutic activity, 97112- Neuromuscular re-education, 97535- Self Care, [id number]- Manual therapy, Patient/Family education, Joint mobilization, and Cryotherapy  PLAN FOR NEXT SESSION: Strength check.  67% (was 61%)?  Need to send re-cert as we will be past 8 weeks.  Cherlyn Cushing, PT, MPT 10/10/2023, 2:10 PM

## 2023-10-11 ENCOUNTER — Encounter (HOSPITAL_BASED_OUTPATIENT_CLINIC_OR_DEPARTMENT_OTHER): Payer: Self-pay

## 2023-10-11 ENCOUNTER — Inpatient Hospital Stay: Payer: Medicare Other | Admitting: Hematology & Oncology

## 2023-10-11 ENCOUNTER — Ambulatory Visit (HOSPITAL_BASED_OUTPATIENT_CLINIC_OR_DEPARTMENT_OTHER)
Admission: RE | Admit: 2023-10-11 | Discharge: 2023-10-11 | Disposition: A | Discharge status: Home / Self Care | Payer: Medicare Other | Source: Ambulatory Visit | Attending: Hematology & Oncology | Admitting: Hematology & Oncology

## 2023-10-11 ENCOUNTER — Inpatient Hospital Stay: Payer: Medicare Other | Attending: Hematology & Oncology

## 2023-10-11 ENCOUNTER — Encounter: Payer: Self-pay | Admitting: Hematology & Oncology

## 2023-10-11 ENCOUNTER — Encounter: Payer: Self-pay | Admitting: *Deleted

## 2023-10-11 VITALS — BP 149/91 | HR 87 | Temp 97.6°F | Resp 20 | Ht 68.0 in | Wt 162.0 lb

## 2023-10-11 DIAGNOSIS — Z9221 Personal history of antineoplastic chemotherapy: Secondary | ICD-10-CM | POA: Insufficient documentation

## 2023-10-11 DIAGNOSIS — N200 Calculus of kidney: Secondary | ICD-10-CM | POA: Diagnosis not present

## 2023-10-11 DIAGNOSIS — Z79899 Other long term (current) drug therapy: Secondary | ICD-10-CM | POA: Diagnosis not present

## 2023-10-11 DIAGNOSIS — Z923 Personal history of irradiation: Secondary | ICD-10-CM | POA: Diagnosis not present

## 2023-10-11 DIAGNOSIS — Z85038 Personal history of other malignant neoplasm of large intestine: Secondary | ICD-10-CM | POA: Insufficient documentation

## 2023-10-11 DIAGNOSIS — C187 Malignant neoplasm of sigmoid colon: Secondary | ICD-10-CM

## 2023-10-11 DIAGNOSIS — C189 Malignant neoplasm of colon, unspecified: Secondary | ICD-10-CM | POA: Diagnosis not present

## 2023-10-11 DIAGNOSIS — I700 Atherosclerosis of aorta: Secondary | ICD-10-CM | POA: Diagnosis not present

## 2023-10-11 LAB — CBC WITH DIFFERENTIAL (CANCER CENTER ONLY)
Abs Immature Granulocytes: 0.03 10*3/uL (ref 0.00–0.07)
Basophils Absolute: 0 10*3/uL (ref 0.0–0.1)
Basophils Relative: 1 %
Eosinophils Absolute: 0.2 10*3/uL (ref 0.0–0.5)
Eosinophils Relative: 3 %
HCT: 36.6 % — ABNORMAL LOW (ref 39.0–52.0)
Hemoglobin: 12.9 g/dL — ABNORMAL LOW (ref 13.0–17.0)
Immature Granulocytes: 1 %
Lymphocytes Relative: 25 %
Lymphs Abs: 1.5 10*3/uL (ref 0.7–4.0)
MCH: 31.4 pg (ref 26.0–34.0)
MCHC: 35.2 g/dL (ref 30.0–36.0)
MCV: 89.1 fL (ref 80.0–100.0)
Monocytes Absolute: 0.7 10*3/uL (ref 0.1–1.0)
Monocytes Relative: 11 %
Neutro Abs: 3.6 10*3/uL (ref 1.7–7.7)
Neutrophils Relative %: 59 %
Platelet Count: 142 10*3/uL — ABNORMAL LOW (ref 150–400)
RBC: 4.11 MIL/uL — ABNORMAL LOW (ref 4.22–5.81)
RDW: 12.1 % (ref 11.5–15.5)
WBC Count: 6 10*3/uL (ref 4.0–10.5)
nRBC: 0 % (ref 0.0–0.2)

## 2023-10-11 LAB — CMP (CANCER CENTER ONLY)
ALT: 21 U/L (ref 0–44)
AST: 26 U/L (ref 15–41)
Albumin: 4.6 g/dL (ref 3.5–5.0)
Alkaline Phosphatase: 69 U/L (ref 38–126)
Anion gap: 6 (ref 5–15)
BUN: 23 mg/dL (ref 8–23)
CO2: 30 mmol/L (ref 22–32)
Calcium: 10 mg/dL (ref 8.9–10.3)
Chloride: 104 mmol/L (ref 98–111)
Creatinine: 1.24 mg/dL (ref 0.61–1.24)
GFR, Estimated: 60 mL/min — ABNORMAL LOW (ref >60)
Glucose, Bld: 101 mg/dL — ABNORMAL HIGH (ref 70–99)
Potassium: 5.2 mmol/L — ABNORMAL HIGH (ref 3.5–5.1)
Sodium: 140 mmol/L (ref 135–145)
Total Bilirubin: 1 mg/dL (ref 0.0–1.2)
Total Protein: 6.9 g/dL (ref 6.5–8.1)

## 2023-10-11 LAB — CEA (ACCESS): CEA (CHCC): 2.32 ng/mL (ref 0.00–5.00)

## 2023-10-11 MED ORDER — IOHEXOL 300 MG/ML  SOLN
100.0000 mL | Freq: Once | INTRAMUSCULAR | Status: AC | PRN
  Administered 2023-10-11: 100 mL via INTRAVENOUS

## 2023-10-11 NOTE — Progress Notes (Signed)
 BP remains elevated 149/91, does not take any BP meds. Instructed to monitor at home and notify PCP if it remains over 140/90.  Verbalized understanding.

## 2023-10-11 NOTE — Progress Notes (Signed)
 Hematology and Oncology Follow Up Visit  Jack Gray [id number] Oct 02, 1945 78 y.o. 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  So far, he is doing quite well.  He really has had no complaints.  He has had no problems with cough or shortness of breath.  We last saw him 6 months ago.  He had no problems over the Holiday season.  We did go ahead and do a CT scan on him today.  Thankfully, the CT scan did not show any evidence of recurrent disease.  His last CEA back in September was 2.58.  He has had no problems with nausea or vomiting.  He has had no rashes.  There is been no bleeding.  Overall, I would say that his performance status is probably ECOG 1.    Medications:  Current Outpatient Medications:    atorvastatin (LIPITOR) 20 MG tablet, Take 20 mg by mouth every evening. , Disp: , Rfl:    Glucosamine HCl (GLUCOSAMINE PO), Take 1 tablet by mouth 2 (two) times daily., Disp: , Rfl:    Multiple Vitamins-Minerals (PRESERVISION AREDS PO), Take 1 capsule by mouth 2 (two) times daily. , Disp: , Rfl:    tamsulosin (FLOMAX) 0.4 MG CAPS capsule, Take 0.4 mg by mouth daily. (Patient not taking: Reported on 10/11/2023), Disp: , Rfl:   Allergies: No Known Allergies  Past Medical History, Surgical history, Social history, and Family History were reviewed and updated.  Review of Systems: Review of Systems  Constitutional:  Negative for appetite change, fatigue, fever and unexpected weight change.  HENT:   Negative for lump/mass, mouth sores, sore throat and trouble swallowing.   Respiratory:  Negative for cough, hemoptysis and shortness of breath.    Cardiovascular:  Negative for leg swelling and palpitations.  Gastrointestinal:  Negative for abdominal distention, abdominal pain, blood in stool, constipation, diarrhea, nausea and vomiting.  Genitourinary:  Negative for bladder incontinence, dysuria, frequency and hematuria.   Musculoskeletal:  Negative for arthralgias, back pain, gait problem and myalgias.  Skin:  Negative for itching and rash.  Neurological:  Negative for dizziness, extremity weakness, gait problem, headaches, numbness, seizures and speech difficulty.  Hematological:  Does not bruise/bleed easily.  Psychiatric/Behavioral:  Negative for depression and sleep disturbance. The patient is not nervous/anxious.     Physical Exam:  height is 5\' 8"  (1.727 m) and weight is 73.5 kg. His oral temperature is 97.6 F (36.4 C). His blood pressure is 149/91 (abnormal) and his pulse is 87. His respiration is 20 and oxygen saturation is 99%.   Wt Readings from Last 3 Encounters:  10/11/23 73.5 kg  04/16/23 74.4 kg  10/13/22 74.4 kg    Physical Exam Vitals reviewed.  HENT:     Head: Normocephalic and atraumatic.  Eyes:     Pupils: Pupils are equal, round, and reactive to light.  Cardiovascular:     Rate and Rhythm: Normal rate and regular rhythm.     Heart sounds: Normal heart sounds.  Pulmonary:     Effort: Pulmonary effort is normal.     Breath sounds: Normal breath sounds.  Abdominal:  General: Bowel sounds are normal.     Palpations: Abdomen is soft.  Musculoskeletal:        General: No tenderness or deformity. Normal range of motion.     Cervical back: Normal range of motion.  Lymphadenopathy:     Cervical: No cervical adenopathy.  Skin:    General: Skin is warm and dry.     Findings: No erythema or rash.  Neurological:     Mental Status: He is alert and oriented to person, place, and time.  Psychiatric:        Behavior: Behavior normal.        Thought Content: Thought content normal.        Judgment:  Judgment normal.      Lab Results  Component Value Date   WBC 6.0 10/11/2023   HGB 12.9 (L) 10/11/2023   HCT 36.6 (L) 10/11/2023   MCV 89.1 10/11/2023   PLT 142 (L) 10/11/2023     Chemistry      Component Value Date/Time   NA 140 10/11/2023 0935   NA 141 08/08/2017 0802   K 5.2 (H) 10/11/2023 0935   K 3.7 08/08/2017 0802   CL 104 10/11/2023 0935   CL 105 08/08/2017 0802   CO2 30 10/11/2023 0935   CO2 29 08/08/2017 0802   BUN 23 10/11/2023 0935   BUN 15 08/08/2017 0802   CREATININE 1.24 10/11/2023 0935   CREATININE 1.1 08/08/2017 0802      Component Value Date/Time   CALCIUM 10.0 10/11/2023 0935   CALCIUM 9.2 08/08/2017 0802   ALKPHOS 69 10/11/2023 0935   ALKPHOS 86 (H) 08/08/2017 0802   AST 26 10/11/2023 0935   ALT 21 10/11/2023 0935   ALT 64 (H) 08/08/2017 0802   BILITOT 1.0 10/11/2023 0935       Impression and Plan: Jack Gray is a 78 year old white male.  He had a stage IIIb adenocarcinoma of the sigmoid colon.  He had this resected.  Even though he had negative lymph nodes, he had 2 nodules which may have been lymph nodes.  He received adjuvant chemotherapy with FOLFOX.  He then had a  pulmonary recurrence which was fairly early after completing the FOLFOX chemotherapy.  Thankfully, he had a solitary recurrence.  He had this resected.  He then had a second recurrence.  This was also in the lung.  He completed SBRT in April, 2020.  It is now been 5 years that he has had his cancer treated.  I really think that we can let him go from the clinic.  I think that the risk of him having recurrence now is probably less than 10%.  I told him that if there is any problems that he had, he can always let us know and we will be more than happy to see him.  I am so happy that he has done well.  It has been a pleasure and privilege to be able to help him out.     Christin Bach, MD

## 2023-10-25 ENCOUNTER — Encounter: Admitting: Rehabilitative and Restorative Service Providers"

## 2023-11-01 ENCOUNTER — Encounter: Payer: Self-pay | Admitting: Rehabilitative and Restorative Service Providers"

## 2023-11-01 ENCOUNTER — Ambulatory Visit (INDEPENDENT_AMBULATORY_CARE_PROVIDER_SITE_OTHER): Admitting: Rehabilitative and Restorative Service Providers"

## 2023-11-01 DIAGNOSIS — G8929 Other chronic pain: Secondary | ICD-10-CM

## 2023-11-01 DIAGNOSIS — M25511 Pain in right shoulder: Secondary | ICD-10-CM

## 2023-11-01 DIAGNOSIS — M6281 Muscle weakness (generalized): Secondary | ICD-10-CM | POA: Diagnosis not present

## 2023-11-01 DIAGNOSIS — M25611 Stiffness of right shoulder, not elsewhere classified: Secondary | ICD-10-CM

## 2023-11-01 DIAGNOSIS — R293 Abnormal posture: Secondary | ICD-10-CM | POA: Diagnosis not present

## 2023-11-01 NOTE — Therapy (Signed)
 OUTPATIENT PHYSICAL THERAPY SHOULDER TREATMENT/DISCHARGE  PHYSICAL THERAPY DISCHARGE SUMMARY  Visits from Start of Care: 6  Current functional level related to goals / functional outcomes: See note   Remaining deficits: See note   Education / Equipment: Updated home exercise program  Patient agrees to discharge. Patient goals were met. Patient is being discharged due to meeting the stated rehab goals.   Patient Name: Jack Gray MRN: [id number] DOB:23-Dec-1945, 78 y.o., male Today's Date: 11/01/2023  END OF SESSION:  PT End of Session - 11/01/23 1351     Visit Number 6    Number of Visits 10    Date for PT Re-Evaluation 10/25/23    Authorization Type MEDICARE    Progress Note Due on Visit 10    PT Start Time 1348    PT Stop Time 1431    PT Time Calculation (min) 43 min    Activity Tolerance Patient tolerated treatment well;No increased pain    Behavior During Therapy [facility] for tasks assessed/performed              Past Medical History:  Diagnosis Date   Anemia    as a child   Cancer of sigmoid colon (HCC) 05/30/2017   Counseling regarding goals of care 06/18/2017   History of kidney stones    Pneumonia    as a child   Past Surgical History:  Procedure Laterality Date   CARDIAC CATHETERIZATION     at age 53-negative   cyctoscopy     for stones   EXTRACORPOREAL SHOCK WAVE LITHOTRIPSY Right 06/05/2019   Procedure: EXTRACORPOREAL SHOCK WAVE LITHOTRIPSY (ESWL);  Surgeon: Heloise Purpura, MD;  Location: [facility];  Service: Urology;  Laterality: Right;   HERNIA REPAIR     r and left inguinal  30 years ago   PARTIAL COLECTOMY Left 05/30/2017   Procedure: LAPAROSCOPIC ASSISTED LEFT COLECTOMY;  Surgeon: Claud Kelp, MD;  Location: [facility];  Service: General;  Laterality: Left;  GENERAL AND TAP BLOCK    PORTACATH PLACEMENT Right 06/11/2017   Procedure: INSERTION PORT-A-CATH;  Surgeon: Claud Kelp, MD;  Location: [facility];  Service: General;   Laterality: Right;   SHOULDER SURGERY     Bil   VIDEO ASSISTED THORACOSCOPY (VATS)/WEDGE RESECTION Left 08/08/2018   Procedure: VIDEO ASSISTED THORACOSCOPY (VATS)/WEDGE RESECTION LEFT LOWER LOBE, LYMPH NODE DISSECTION;  Surgeon: Loreli Slot, MD;  Location: [facility];  Service: Thoracic;  Laterality: Left;   Patient Active Problem List   Diagnosis Date Noted   Pain in right shoulder 02/15/2022   Solitary pulmonary nodule 11/07/2018   Lung mass 08/08/2018   Counseling regarding goals of care 06/18/2017   Cancer of sigmoid colon (HCC) 05/30/2017    PCP: Gweneth Dimitri, MD  REFERRING PROVIDER: Cammy Copa, MD  REFERRING DIAG: Diagnosis M25.511 (ICD-10-CM) - Right shoulder pain, unspecified chronicity  THERAPY DIAG:  Abnormal posture  Stiffness of right shoulder, not elsewhere classified  Chronic right shoulder pain  Muscle weakness (generalized)  Rationale for Evaluation and Treatment: Rehabilitation  ONSET DATE: July 2023  SUBJECTIVE:  SUBJECTIVE STATEMENT: Jack Gray notes better strength and range since starting PT.  "It's about 70% better."    Jack Gray took a huge divot playing golf in July of 2023 which resulted in the original injury.  He is playing golf now but is fearful of taking another large divot and further injuring his shoulder.  He has a history of a previous right rotator cuff repair and left shoulder surgery.    Hand dominance: Right  PERTINENT HISTORY: Kidney stones, hernia repair, colon cancer  PAIN:  Are you having pain? Yes: NPRS scale: 0-3/10 (was 1-6/10) the past 7 days Pain location: Rt shoulder Pain description: Quick jab, sharp pain that goes away quickly Aggravating factors: Extension, lifting with an extended elbow away from the body Relieving factors: Get out  of painful positions  PRECAUTIONS: None  RED FLAGS: None   WEIGHT BEARING RESTRICTIONS: No  FALLS:  Has patient fallen in last 6 months? No  LIVING ENVIRONMENT: Lives with: lives with their family and lives with their spouse Lives in: House/apartment Stairs:  No issues Has following equipment at home: None  OCCUPATION: Retired  PLOF: Independent  PATIENT GOALS: Return to golf, running errands, walking without being limited by pain  NEXT MD VISIT:   OBJECTIVE:  Note: Objective measures were completed at Evaluation unless otherwise noted.  DIAGNOSTIC FINDINGS:  IMPRESSION: 1. Partial thickness articular surface tears of the distal supraspinatus and upper infraspinatus tendons. I do not observe a definite full-thickness tear, but there is some contrast in the subacromial subdeltoid bursa. It looks like this patient was injected through the rotator interval, it is possible that this contrast leaked through the rotator interval into the subacromial subdeltoid bursa during injection rather than necessarily being from an occult full-thickness tear. 2. Moderate tendinopathy of the intra-articular segment of the biceps tendon. 3. Moderate degenerative chondral thinning in the glenohumeral joint. 4. Mild degenerative spurring in the acromioclavicular joint.    PATIENT SURVEYS:  10/31/2025 FOTO 80 (Goal met)  FOTO risk adjusted 54 (Goal 71 in 10 visits)  COGNITION: Overall cognitive status: Within functional limits for tasks assessed     SENSATION: WFL  POSTURE: Slightly forward head, internally rotated and protracted shoulders  UPPER EXTREMITY ROM:   Passive ROM in degrees Left/Right 08/30/2023 Right 09/12/2023 Right 09/19/2023 Right 10/10/2023 Right 11/01/2023  Shoulder flexion 160/160 160 165 170 170  Shoulder extension       Shoulder abduction       Shoulder horizontal adduction 40/30 35 35 40 40  Shoulder internal rotation 55/40 60 55 55 65  Shoulder external  rotation 95/85 90 90 100 100  Elbow flexion       Elbow extension       Wrist flexion       Wrist extension       Wrist ulnar deviation       Wrist radial deviation       Wrist pronation       Wrist supination       (Blank rows = not tested)  UPPER EXTREMITY STRENGTH:  In pounds assessed with hand-held-dynamometer Left/Right 08/30/2023 Right 09/19/2023 Right 10/10/2023 Right 11/01/2023  Shoulder flexion      Shoulder extension      Shoulder abduction      Shoulder adduction      Shoulder internal rotation 44.7/38.4 40.5 41.5 39.9  Shoulder external rotation 27.1/23.8 25.8 25.6 25.7  Middle trapezius      Lower trapezius      Elbow flexion  Elbow extension      Wrist flexion      Wrist extension      Wrist ulnar deviation      Wrist radial deviation      Wrist pronation      Wrist supination      Grip strength (lbs)      (Blank rows = not tested)                                                                                                                            TREATMENT DATE:  11/01/2023 Scapular retraction/shoulder blade pinches 10 x 5 seconds HEP only Supine shoulder internal rotation stretch 10 x 10 seconds Posterior capsule stretch 10 x 10 seconds HEP only Thumb up the back/shoulder IR stretch 10 x 10 seconds Thera-Band ER Blue 2 sets of 10 with slow eccentrics  Thera-Band IR Blue 10 x slow eccentrics Scapular protraction with Blue Thera-Band 20 x 3 seconds (HEP only) Rows with Blue Thera-Band 30 x 3 seconds Prone 90 & 100 degrees 10 x 3 seconds each  Functional Activities:  Supine arm raises/scapular protraction 20 x 10# (for reaching) Supine shoulder flexion for reaching and overhead function (palms in, protract 1st, elbows in by ears) 10 x 10 seconds Reviewed shoulder anatomy, mechanics of impingement and the importance of slow eccentrics, rest and avoiding overuse   10/10/2023 Scapular retraction/shoulder blade pinches 10 x 5 seconds HEP only Supine  shoulder internal rotation stretch 20 x 10 seconds Posterior capsule stretch 10 x 10 seconds HEP only Thumb up the back/shoulder IR stretch 10 x 10 seconds Thera-Band ER Blue 2 sets of 10 with slow eccentrics  Thera-Band IR Blue 10 x slow eccentrics Scapular protraction with Blue Thera-Band 20 x 3 seconds (HEP only) Rows with Blue Thera-Band 30 x 3 seconds Prone 90 & 100 degrees 10 x 3 seconds each  Functional Activities:  Supine arm raises/scapular protraction 30 x 10 # (for reaching) Supine shoulder flexion for reaching and overhead function (palms in, protract 1st, elbows in by ears) 10 x 10 seconds Reviewed shoulder mechanics and the importance of slow eccentrics, rest and avoiding overuse   09/19/2023 Scapular retraction/shoulder blade pinches 10 x 5 seconds HEP only Supine shoulder internal rotation stretch 10 x 10 seconds Posterior capsule stretch 10 x 10 seconds Thumb up the back/shoulder IR stretch 10 x 10 seconds Thera-Band ER Blue 2 sets of 10 with slow eccentrics (HEP only) Thera-Band IR Blue 10 x slow eccentrics (HEP only) Scapular protraction with Blue Thera-Band 20 x 3 seconds (HEP only)  Functional Activities:  Extended education reviewing imaging and shoulder model and comparing this to objective values and symptoms Supine shoulder flexion for reaching and overhead function (palms in, protract 1st, elbows in by ears) 10 x 10 seconds   PATIENT EDUCATION: Education details: See above Person educated: Patient Education method: Explanation, Demonstration, Tactile cues, Verbal cues, and Handouts Education comprehension: verbalized understanding, returned demonstration, verbal cues  required, tactile cues required, and needs further education  HOME EXERCISE PROGRAM: Access Code: [email or web address] URL: [email or web address] Date: 11/01/2023 Prepared by: Pauletta Browns  Exercises - Supine Shoulder Internal Rotation  - 1 x daily - 7 x weekly - 1 sets - 5-10 reps -  10 seconds hold - Standing Scapular Retraction  - 3-5 x daily - 7 x weekly - 1 sets - 5 reps - 5 second hold - Shoulder External Rotation with Anchored Resistance  - 1 x daily - 3 x weekly - 2 sets - 10 reps - 3 seconds hold - Seated Cervical Rotation AROM  - 3 x daily - 1 x weekly - 1 sets - 10 reps - 5 seconds hold - Standing Isometric Cervical Extension with Manual Resistance  - 5 x daily - 1 x weekly - 1 sets - 5 reps - 5 seconds hold - Standing Shoulder Row with Anchored Resistance  - 1 x daily - 3 x weekly - 1 sets - 20 reps - 3 seconds hold - Supine Shoulder Flexion Extension Full Range AROM  - 1 x daily - 7 x weekly - 1 sets - 5-10 reps - 10 seconds hold - Standing Shoulder Internal Rotation Stretch with Hands Behind Back  - 1 x daily - 7 x weekly - 1 sets - 5-10 reps - 10 seconds hold - Standing Shoulder Posterior Capsule Stretch  - 1 x daily - 3 x weekly - 1 sets - 5 reps - 10 hold - Shoulder Internal Rotation with Resistance  - 1 x daily - 3 x weekly - 1 sets - 10 reps - 3 seconds hold  ASSESSMENT:  CLINICAL IMPRESSION: Jack Gray reports minimal difficulty with his normal activities of the past few weeks.  He continues his excellent home exercise program compliance and strength was objectively assessed and improved ER:IR strength ratio as compared to few weeks ago.  Jack Gray realizes normalizing ER:IR strength ratio, long-term 3 times a week home exercise program compliance and avoiding heavy lifting away from the body will be important for long-term success he has demonstrated independence and compliance with his long-term exercises and appears ready for independent rehabilitation.   Patient is a 78 y.o. male who was seen today for physical therapy evaluation and treatment for Diagnosis M25.511 (ICD-10-CM) - Right shoulder pain, unspecified chronicity.  Jack Gray had a previous right rotator cuff repair and the current episode originated in July 2023 when playing golf.  Jack Gray would like to  avoid another surgery while still being being able to play golf and do his normal functional activities without pain.  Overall, right shoulder strength is not bad, but scapular and rotator cuff strength could improve and his capsular flexibility is quite limited, particularly in the posterior capsule.  This was the focus of today's activities as Jack Gray reports he has been doing his Thera-Band activities since surgery on a regular basis.  He will still benefit from more specific scapular and rotator cuff strengthening activities and is a good candidate for supervised physical therapy.  OBJECTIVE IMPAIRMENTS: decreased activity tolerance, decreased endurance, decreased knowledge of condition, decreased ROM, decreased strength, decreased safety awareness, increased edema, impaired perceived functional ability, impaired UE functional use, postural dysfunction, and pain.   ACTIVITY LIMITATIONS: carrying, lifting, sleeping, and reach over head  PARTICIPATION LIMITATIONS: community activity and golf  PERSONAL FACTORS: Kidney stones, hernia repair, colon cancer are also affecting patient's functional outcome.   REHAB POTENTIAL: Good  CLINICAL DECISION MAKING: Stable/uncomplicated  EVALUATION COMPLEXITY: Low  GOALS: Goals reviewed with patient? Yes  SHORT TERM GOALS: Target date: 09/27/2023  Jack Gray will be independent with his day 1 HEP Baseline: Started 08/30/2023 Goal status: Met 09/05/2023  2.  Improve right shoulder active range of motion to 100% of the uninvolved left Baseline: See above, right side is more limited, particularly with the posterior capsule Goal status: Met 10/10/2023  3.  Improve right shoulder strength to at least 100% of the uninvolved left Baseline: See objective, right side is weaker Goal status: On Going 11/01/2023   LONG TERM GOALS: Target date: 10/25/2023  Improve FOTO to 71 in 10 visits Baseline: Risk adjusted 54 Goal status: Met 11/01/2023  2.  Parish will  report right shoulder pain consistently 0-3/10 on the Numeric Pain Rating Scale. Baseline: 1-6/10 Goal status: Met 10/10/2023  3.  Improve shoulder AROM for flexion to 170/170; IR to 60/60; ER to 90/90 and horizontal adduction to 40/40 Baseline: Flexion 160/160; IR 55/40; ER 95/85 and horizontal adduction 40/30 Goal status: Met 11/01/2023  4.  Improve shoulder strength for IR to 45/45 and ER to 30/30 Baseline: See objective Goal status: On Going 11/01/2023  5.  Messiah will be independent with his long-term maintenance HEP at DC Baseline: Started 08/30/2023 Goal status: Met 11/01/2023   PLAN:  PT FREQUENCY: DC  PT DURATION: DC  PLANNED INTERVENTIONS: 97110-Therapeutic exercises, 97530- Therapeutic activity, 97112- Neuromuscular re-education, 97535- Self Care, 74259- Manual therapy, Patient/Family education, Joint mobilization, and Cryotherapy  PLAN FOR NEXT SESSION: DC  Cherlyn Cushing, PT, MPT 11/01/2023, 3:27 PM

## 2023-12-04 DIAGNOSIS — E782 Mixed hyperlipidemia: Secondary | ICD-10-CM | POA: Diagnosis not present

## 2023-12-11 DIAGNOSIS — H353131 Nonexudative age-related macular degeneration, bilateral, early dry stage: Secondary | ICD-10-CM | POA: Diagnosis not present

## 2024-01-28 DIAGNOSIS — D2262 Melanocytic nevi of left upper limb, including shoulder: Secondary | ICD-10-CM | POA: Diagnosis not present

## 2024-01-28 DIAGNOSIS — L821 Other seborrheic keratosis: Secondary | ICD-10-CM | POA: Diagnosis not present

## 2024-01-28 DIAGNOSIS — D2272 Melanocytic nevi of left lower limb, including hip: Secondary | ICD-10-CM | POA: Diagnosis not present

## 2024-01-28 DIAGNOSIS — L570 Actinic keratosis: Secondary | ICD-10-CM | POA: Diagnosis not present

## 2024-01-28 DIAGNOSIS — D2271 Melanocytic nevi of right lower limb, including hip: Secondary | ICD-10-CM | POA: Diagnosis not present

## 2024-02-14 ENCOUNTER — Other Ambulatory Visit: Payer: Self-pay | Admitting: Urology

## 2024-02-14 DIAGNOSIS — R1084 Generalized abdominal pain: Secondary | ICD-10-CM | POA: Diagnosis not present

## 2024-02-14 DIAGNOSIS — N202 Calculus of kidney with calculus of ureter: Secondary | ICD-10-CM | POA: Diagnosis not present

## 2024-02-14 DIAGNOSIS — R3121 Asymptomatic microscopic hematuria: Secondary | ICD-10-CM | POA: Diagnosis not present

## 2024-02-15 ENCOUNTER — Ambulatory Visit (HOSPITAL_COMMUNITY)
Admission: RE | Admit: 2024-02-15 | Discharge: 2024-02-15 | Disposition: A | Discharge status: Home / Self Care | Attending: Urology | Admitting: Urology

## 2024-02-15 ENCOUNTER — Encounter (HOSPITAL_COMMUNITY)
Admission: RE | Disposition: A | Discharge status: Home / Self Care | Payer: Self-pay | Source: Home / Self Care | Attending: Urology

## 2024-02-15 ENCOUNTER — Ambulatory Visit (HOSPITAL_COMMUNITY)

## 2024-02-15 ENCOUNTER — Encounter (HOSPITAL_COMMUNITY): Payer: Self-pay | Admitting: Urology

## 2024-02-15 ENCOUNTER — Other Ambulatory Visit: Payer: Self-pay

## 2024-02-15 DIAGNOSIS — Z539 Procedure and treatment not carried out, unspecified reason: Secondary | ICD-10-CM | POA: Insufficient documentation

## 2024-02-15 DIAGNOSIS — N202 Calculus of kidney with calculus of ureter: Secondary | ICD-10-CM | POA: Insufficient documentation

## 2024-02-15 DIAGNOSIS — N200 Calculus of kidney: Secondary | ICD-10-CM

## 2024-02-15 DIAGNOSIS — N201 Calculus of ureter: Secondary | ICD-10-CM | POA: Diagnosis not present

## 2024-02-15 HISTORY — PX: EXTRACORPOREAL SHOCK WAVE LITHOTRIPSY: SHX1557

## 2024-02-15 SURGERY — LITHOTRIPSY, ESWL
Anesthesia: LOCAL | Laterality: Left

## 2024-02-15 MED ORDER — DIAZEPAM 5 MG PO TABS: 10.0000 mg | ORAL_TABLET | ORAL | Status: DC

## 2024-02-15 MED ORDER — DIAZEPAM 5 MG PO TABS
5.0000 mg | ORAL_TABLET | Freq: Once | ORAL | Status: AC
  Administered 2024-02-15: 5 mg via ORAL
  Filled 2024-02-15: qty 1

## 2024-02-15 MED ORDER — DIPHENHYDRAMINE HCL 25 MG PO CAPS
25.0000 mg | ORAL_CAPSULE | ORAL | Status: AC
  Administered 2024-02-15: 25 mg via ORAL
  Filled 2024-02-15: qty 1

## 2024-02-15 MED ORDER — CIPROFLOXACIN HCL 500 MG PO TABS
500.0000 mg | ORAL_TABLET | ORAL | Status: AC
  Administered 2024-02-15: 500 mg via ORAL
  Filled 2024-02-15: qty 1

## 2024-02-15 MED ORDER — SODIUM CHLORIDE 0.9 % IV SOLN
INTRAVENOUS | Status: DC
  Administered 2024-02-15: 1000 mL via INTRAVENOUS

## 2024-02-15 NOTE — Interval H&P Note (Signed)
 History and Physical Interval Note:  02/15/2024 4:39 PM  Jack Gray  has presented today for surgery, with the diagnosis of LEFT URETERAL STONE.  The various methods of treatment have been discussed with the patient and family. After consideration of risks, benefits and other options for treatment, the patient has consented to  Procedure(s): LITHOTRIPSY, ESWL (Left) as a surgical intervention.  The patient's history has been reviewed, patient examined, no change in status, stable for surgery.  I have reviewed the patient's chart and labs.  I discussed with the patient and his wife on my initial review of the KUB today I thought the stone had passed but radiology did locate an opacity that could be the stone.  He has not had any further pain or vomiting.  He has also not been straining and has not seen a stone pass.  We discussed canceling today's procedure and following up in the office with another KUB and possibly an ultrasound or proceeding today with fluoroscopy on the truck and possible IVP.  All questions answered.  They elected to proceed.  He has otherwise been well without dysuria or gross hematuria.  Questions were answered to the patient's satisfaction.     Donnice Brooks

## 2024-02-15 NOTE — Progress Notes (Addendum)
 Patient's stone could not be identified so Dr. Nieves cancelled procedure and stated patient doesn't have to urinate prior to leaving since no additional sedation was given and procedure cancelled.  Discharge instructions reviewed and copies given to patient's spouse. Denies questions. Will follow up with [facility] as needed.

## 2024-02-15 NOTE — H&P (Signed)
 Office Visit Report     02/14/2024   --------------------------------------------------------------------------------   Jack Gray  MRN: [id number]  DOB: 28-Aug-1945, 78 year old Male  SSN: [id number]   PRIMARY CARE:  Francis P. Cyrena (retired), MD  PRIMARY CARE FAX:  [phone]  REFERRING:  Ubaldo FABIENE Eagles, NP  PROVIDER:  Gretel Ferrara, M.D.  TREATING:  Donnice Brooks, M.D.  LOCATION:  [facility] [phone]     --------------------------------------------------------------------------------   CC/HPI: 05/07/2019: Mr. Babler is a 78 year old patient of Dr. Ottelin. He has a longstanding history of urolithiasis and has undergone intervention with shockwave lithotripsy as well as passed multiple stones. His last stone episode was in 2012. He does have a history of metastatic colon cancer and had just undergone a surveillance imaging study on 04/25/2019. This demonstrated no evidence of recurrent colon cancer fortunately. It did show multiple bilateral nonobstructing kidney stones with the largest measuring approximately 5 mm. He did have 1 stone on the right side that was near his UPJ. He was completely asymptomatic at that time but then subsequently developed severe right-sided flank and lower quadrant abdominal pain approximately 2 days ago. This was associated with nausea and vomiting and was similar to his prior kidney stone episodes. He has not had any fever. He presents today with continued and persistent pain.   05/21/2019: Likely ureteral stone not well visualized on previous KUB imaging study. Patient started on medical expulsive therapy with tamsulosin. He returns today for repeat evaluation. Denies any interval stone material passage.   He continues tamsulosin. After receiving Toradol  injection last office visit he is only had a few instances of pain recurrence that quickly resolves with use of pain medication he has on hand. He has tolerated tamsulosin without  side effect. Denies any bothersome increasing urinary frequency, changes in force of stream, burning or painful urination visible blood in the urine. He has been afebrile, no interval n/v.   06/19/2019: Returns today for KUB after undergoing shockwave lithotripsy to treat symptomatic calculus in the distal right ureter. He brings in several stone fragments today. He's had no recurrence of right-sided pain/discomfort. Grossly voiding at his baseline with non-bothersome frequency/urgency, dysuria, visible blood in the urine. Force of stream is adequate and he feels empty after each void. He continues tamsulosin but wonders if he can discontinue this as he has not noted any appreciable difference in baseline voiding symptoms on the medication. KUB today shows resolution of the previously noted right distal ureteral calculi. He does have bilateral nonobstructing calculi of similar or smaller size to the treated calculus. UA is clear. No postoperative fevers or chills, nausea/vomiting.   06/16/2020: Here today for annual f/u. Patient with past history of colon and lung cancer having previously had colon resection, lobe resection, and chemotherapy/radiation treatment. Now currently in remission and continued to be followed by oncology in regards to this. He recently had CT imaging of the chest abdomen and pelvis back in September which noted no new or concerning GU abnormality. He has stable bilateral nonobstructive stone disease with all stones measured less than 5 mm in size. Over the past year the patient denies any interval stone material passage. He has not had unilateral pain/discomfort suggestive of obstructive uropathy. He continues to void at his baseline with no bothersome daytime symptomatology. He denies changes in force of stream or difficulty starting/stopping. No interval burning or painful urination, visible blood in the urine, interval treatment for UTI. He typically gets up 1-2 times  per night but is  not overly bothered by this. Patient also up-to-date with prostate cancer screening from primary care provider.   06/24/2021: Patient returns today for annual follow-up. Past history of nephrolithiasis. He routinely gets CT imaging of the chest abdomen and pelvis for ongoing surveillance of his prior history of colon and lung cancer. Last CT imaging performed in September which I reviewed. He has several small nonobstructing calculi bilaterally which grossly remain stable compared to imaging performed last year. There is no concerning GU mass or lesion. No obvious obstructive signs, grossly unremarkable bladder.   Overall doing well. He denies any interval stone material passage. No recurrence of pain/discomfort suggestive of obstructive uropathy. He continues to void at his baseline with no bothersome daytime symptomatology. He denies changes in force of stream or difficulty starting/stopping. No interval burning or painful urination, visible blood in the urine, interval treatment for UTI. He typically gets up 1-2 times per night but is not overly bothered by this.   He remains current with annual PSA screening from PCP. Historically his lab values have been within range. Noting this, at age 79 I have recommended discontinuing PSA screening at this time.   06/22/2022: Returns today for annual office visit with known past history of nephrolithiasis. He continues to receive routine CT imaging of the chest abdomen and pelvis for ongoing surveillance of his prior history of colon and lung cancer. Imaging from September of this year continued to show stable small bilateral nonobstructing renal calculi but no other concerning GU abnormality identified. He continues to do well from a urological perspective. No interval stone material passage, no pain or discomfort suggestive of obstructive uropathy. He continues to void at his baseline without bothersome daytime symptomology. Nocturia 1-2 times nightly. No  interval dysuria or gross hematuria.   06/21/2023: Patient returns today for follow-up evaluation with known past history of nephrolithiasis. Surveillance CT imaging of the chest/abdomen/pelvis for prior history of colon and lung cancer repeated again this past September. This continues to show stable small bilateral nonobstructing renal calculi without other evidence of concerning GU abnormality. Patient denies interval stone material passage, he will get some occasional twinges of pain in the lower back or flank that usually resolves after couple of hours. When he thinks he is having a stone event he will sometimes take tamsulosin but again denies stone passage, no interval dysuria or gross hematuria as well. UA today within normal limits. Voiding symptoms grossly stable, denies changes in force of stream or bothersome increase in frequency/urgency in the daytime. Continues to feel like he empties his bladder well. Nocturia stable x 2.   02/14/2024: Patient of Dr. Renda added on today for flank pain and nausea. Long history of stones. Surveillance CT imaging of the chest/abdomen/pelvis for prior history of colon and lung cancer repeated again this past September. This continues to show stable small bilateral nonobstructing renal calculi without other evidence of concerning GU abnormality. He underwent a repeat CT chest abdomen and pelvis March 2025 for history of colon cancer which revealed stable small bilateral stones largest 4 to 5 mm on the left. Visible on the scout image. He developed left flank pain last night. He then had emesis at 11 pm. He hasn't had anyting to eat or drink because he was told not to consume anything. He took a tamsulosin but fely lightheaded and passed out. AFVSS. He gets lightheaded with tams and has passed before with it. He continues to have left flank pain. Took  an old oxycodone . Help a lot. Woke this morning without pain but pain recurred. He has had eswl and URS/stent. He was  given IM promethazine  25 mg and felt much better. KUB today-4 mm left UVJ stone. Left renal stones. Right lower pole stone. He was also given ketorolac  IM 60 mg.     ALLERGIES: Demerol  SOLN    MEDICATIONS: ALIGN PROBIOTIC  Atorvastatin  Calcium   Multivitamins TABS Oral  Preservision Areds     GU PSH: ESWL, Right - 2020, 2011, 2011       PSH Notes: Lithotripsy, Inguinal Hernia Repair, Cystoscopy For Removal Of Urethral Stone, Lithotripsy   NON-GU PSH: Hernia Repair Lung Surgery (Unspecified) Visit Complexity (formerly GPC1X) - 06/21/2023     GU PMH: Nocturia - 06/21/2023, - 06/22/2022, - 2022, - 2021 Renal calculus - 06/21/2023, - 06/22/2022, - 2022, - 2021, Bilateral kidney stones, - 2014 History of urolithiasis, Nephrolithiasis - 2014 Hydronephrosis Unspec, Hydronephrosis On The Right - 2014 Ureteral calculus, Ureteral Stone - 2014      PMH Notes:  2009-11-19 07:19:28 - Note: Normal Routine History And Physical Adult   NON-GU PMH: Personal history of other diseases of the circulatory system, History of hypertension - 2014 Hypercholesterolemia    FAMILY HISTORY: Cerebral Artery Aneurysm - Father Emphysema - Mother Family Health Status Number - Runs In Family   SOCIAL HISTORY: Marital Status: Married Preferred Language: English; Ethnicity: Not Hispanic Or Latino; Race: White Current Smoking Status: Patient has never smoked.   Tobacco Use Assessment Completed: Used Tobacco in last 30 days? Does not drink anymore.  Drinks 1 caffeinated drink per day.     Notes: Never A Smoker, Drug Use, Caffeine Use, Marital History - Currently Married, Alcohol Use, Occupation:   REVIEW OF SYSTEMS:    GU Review Male:   Patient reports get up at night to urinate. Patient denies frequent urination, hard to postpone urination, burning/ pain with urination, leakage of urine, stream starts and stops, trouble starting your stream, have to strain to urinate , erection problems, and penile  pain.  Gastrointestinal (Upper):   Patient reports nausea and vomiting. Patient denies indigestion/ heartburn.  Gastrointestinal (Lower):   Patient denies diarrhea and constipation.  Constitutional:   Patient denies fever, night sweats, weight loss, and fatigue.  Skin:   Patient denies skin rash/ lesion and itching.  Eyes:   Patient denies blurred vision and double vision.  Ears/ Nose/ Throat:   Patient denies sore throat and sinus problems.  Hematologic/Lymphatic:   Patient denies swollen glands and easy bruising.  Cardiovascular:   Patient denies leg swelling and chest pains.  Respiratory:   Patient denies cough and shortness of breath.  Endocrine:   Patient denies excessive thirst.  Musculoskeletal:   Patient denies back pain and joint pain.  Neurological:   Patient denies headaches and dizziness.  Psychologic:   Patient denies depression and anxiety.   VITAL SIGNS:      02/14/2024 11:06 AM  Weight 162 lb / 73.48 kg  Height 68 in / 172.72 cm  BP 135/77 mmHg  Heart Rate 91 /min  Temperature 98.2 F / 36.7 C  BMI 24.6 kg/m   MULTI-SYSTEM PHYSICAL EXAMINATION:    Constitutional: Well-nourished. No physical deformities. Normally developed. Good grooming.  Neck: Neck symmetrical, not swollen. Normal tracheal position.  Respiratory: No labored breathing, no use of accessory muscles.   Cardiovascular: Normal temperature, normal extremity pulses, no swelling, no varicosities.  Skin: No paleness, no jaundice, no cyanosis. No lesion, no  ulcer, no rash.  Neurologic / Psychiatric: Oriented to time, oriented to place, oriented to person. No depression, no anxiety, no agitation.  Gastrointestinal: No mass, no tenderness, no rigidity, non obese abdomen.     PAST DATA REVIEW: None   PROCEDURES:         KUB - 74018  A single view of the abdomen is obtained.  Calculi:  KUB today-4 mm left UVJ stone. Left renal stones. Right lower pole stone.      The bones appeared normal. The bowel gas  pattern appeared normal. The soft tissues were unremarkable. . Patient confirmed No Neulasta OnPro Device.           Urinalysis - 81003         Urinalysis w/Scope - 81001 Dipstick Dipstick Cont'd Micro  Color: Yellow Bilirubin: Neg WBC/hpf: NS (Not Seen)  Appearance: Slightly Cloudy Ketones: Trace RBC/hpf: 20 - 40/hpf  Specific Gravity: 1.015 Blood: 3+ Bacteria: Rare (0-9/hpf)  pH: 7.0 Protein: 1+ Cystals: Amorph Phosphates  Glucose: Neg Urobilinogen: 0.2 Casts: NS (Not Seen)    Nitrites: Neg Trichomonas: Not Present    Leukocyte Esterase: Neg Mucous: Not Present      Epithelial Cells: 0 - 5/hpf      Yeast: NS (Not Seen)      Sperm: Not Present    Notes:            Ketoralac 60mg  - J1885A, [id number] zero wasted   Qty: 60 Adm. By: Erica Wheless  Unit: mg Adm. On: 02/15/2024 12:13 AM  Route: IM Lot No: [id number]  Freq: None Exp. Date: 01/05/2025    Mfgr.:   Site: Left Hip         Phenergan  25mg  - J2550, [id number] Zero wasted.   Qty: 25 Adm. By: Lovestar Francois  Unit: mg Adm. On: 02/14/2024 11:29 AM  Route: IM Lot No: [id number]  Freq: None Exp. Date: 06/07/2024    Mfgr.:   Site: Right Hip   ASSESSMENT:      ICD-10 Details  1 GU:   Ureteral calculus - N20.1 Chronic, Worsening - We went overDiscussed with patient and wife KUB findings. He felt much better and drank a bottle of water. The nature risk and benefits of continued stone passage, ureteroscopy and lithotripsy. We discussed changing to silodosin and following up in a week or 2 for continued surveillance. He will proceed with shockwave tomorrow.  2   Renal calculus - N20.0 Chronic, Stable  3   Flank Pain - R10.84 Undiagnosed New Problem  4   Microscopic hematuria - R31.21 Undiagnosed New Problem   PLAN:            Medications New Meds: Ondansetron  HCl 8 MG Tablet 1 tablet PO Q 8 H PRN   #10  0 Refill(s)  oxyCODONE  HCl 5 MG Tablet 1 tablet PO Q 6 H PRN   #10  0 Refill(s)  Pharmacy Name:  [facility] #[id number]   Address:  [address]   [place], [place] [id number]  Phone:  [phone]  Fax:  [phone]    Stop Meds: Glucosamine TABS Oral  Start: 05/26/2010  Discontinue: 02/14/2024  - Reason: The medication cycle was completed.            Orders X-Rays: KUB          Schedule         Document Letter(s):  Created for Patient: Clinical Summary         Next  Appointment:      Next Appointment: 02/29/2024 10:45 AM    Appointment Type: KUB    Location: [facility] [phone]    Provider: KUB KUB    Reason for Visit: 2 WK PO--LLEFT ESWL      * Signed by Donnice Brooks, M.D. on 02/15/24 at 4:37 PM (EDT)*

## 2024-02-15 NOTE — Progress Notes (Signed)
 Spoke w/ via phone for pre-op interview---patient Lab needs dos----   KUB      Lab results------ COVID test --Not indicated---patient states asymptomatic no test needed Arrive at -------1:00 pm NPO after MN NO Solid Food.  Clear liquids from MN until---0800 Pre-Surgery Ensure or G2:  Med rec completed Medications to take morning of surgery ----- Diabetic medication -----  GLP1 agonist last dose: GLP1 instructions:  Patient instructed no nail polish to be worn day of surgery Patient instructed to bring photo id and insurance card day of surgery Patient aware to have Driver (ride ) / caregiver    for 24 hours after surgery - spouse- Orlean [phone] Patient Special Instructions -----took laxative yesterday, bring blue folder Pre-Op special Instructions -----per Norita Dustman and Litho  Patient verbalized understanding of instructions that were given at this phone interview. Patient denies chest pain, sob, fever, cough at the interview.

## 2024-02-15 NOTE — Op Note (Signed)
 Preoperative diagnosis: Left ureteral stone Postoperative diagnosis: Bilateral renal stones  Procedure: Fluoroscopy of kidney, ureter and bladder area  Surgeon: Nieves  Anesthesia: None  Indication for procedure: [name] is a 78 year old male with significant flank pain and emesis.  KUB 02/14/2024 revealed a 4 mm left distal stone.  He presented today for shockwave and has not seen a stone pass.  His pain resolved.  No further emesis.  Possible opacity on KUB consistent with the stone.  Discussed options and patient elected to proceed.  Findings: Bethena appears to have passed.  No stone visible along the course of the ureter under spot fluoroscopic images.  Small left renal stones.  Patient asked to speak to me and recalled now voiding sometime yesterday afternoon or evening and then seeing a small black spot in the toilet that he thought was stuck to the bottom of the toilet, but when he flushed it was evacuated and disappeared.  This was likely his stone.    The procedure was discontinued. Please see [facility] full operative report. He will follow-up in office as planned.

## 2024-02-15 NOTE — Progress Notes (Addendum)
 Left voicemail stating to arrive at 1:00 pm at main entrance of [facility], bring driver's license, insurance card and blue folder, don't wear metal from waist down, don't bring money, valuables, jewelry or credit cards, don't eat anything after 0800 this morning, please don't take any nonsteroidal anti inflammatories, and please call us  back at [phone] as soon as possible.

## 2024-02-18 ENCOUNTER — Encounter (HOSPITAL_COMMUNITY): Payer: Self-pay | Admitting: Urology

## 2024-02-25 DIAGNOSIS — N1831 Chronic kidney disease, stage 3a: Secondary | ICD-10-CM | POA: Diagnosis not present

## 2024-02-25 DIAGNOSIS — N200 Calculus of kidney: Secondary | ICD-10-CM | POA: Diagnosis not present

## 2024-02-25 DIAGNOSIS — I2510 Atherosclerotic heart disease of native coronary artery without angina pectoris: Secondary | ICD-10-CM | POA: Diagnosis not present

## 2024-02-25 DIAGNOSIS — Z6825 Body mass index (BMI) 25.0-25.9, adult: Secondary | ICD-10-CM | POA: Diagnosis not present

## 2024-02-25 DIAGNOSIS — M75101 Unspecified rotator cuff tear or rupture of right shoulder, not specified as traumatic: Secondary | ICD-10-CM | POA: Diagnosis not present

## 2024-02-25 DIAGNOSIS — Z85038 Personal history of other malignant neoplasm of large intestine: Secondary | ICD-10-CM | POA: Diagnosis not present

## 2024-02-25 DIAGNOSIS — Z8589 Personal history of malignant neoplasm of other organs and systems: Secondary | ICD-10-CM | POA: Diagnosis not present

## 2024-02-25 DIAGNOSIS — E782 Mixed hyperlipidemia: Secondary | ICD-10-CM | POA: Diagnosis not present

## 2024-02-26 ENCOUNTER — Other Ambulatory Visit (HOSPITAL_COMMUNITY): Payer: Self-pay | Admitting: Family Medicine

## 2024-02-26 DIAGNOSIS — I2510 Atherosclerotic heart disease of native coronary artery without angina pectoris: Secondary | ICD-10-CM

## 2024-03-13 ENCOUNTER — Ambulatory Visit (HOSPITAL_COMMUNITY)
Admission: RE | Admit: 2024-03-13 | Discharge: 2024-03-13 | Disposition: A | Discharge status: Home / Self Care | Payer: Self-pay | Source: Ambulatory Visit | Attending: Family Medicine | Admitting: Family Medicine

## 2024-03-13 DIAGNOSIS — I2510 Atherosclerotic heart disease of native coronary artery without angina pectoris: Secondary | ICD-10-CM | POA: Insufficient documentation

## 2024-04-15 NOTE — Progress Notes (Signed)
 " Cardiology Office Note:  .   Date:  04/17/2024 ID:  Jack Gray, DOB 1945/11/14, MRN [id number] PCP: Aisha Harvey, MD [facility] Providers Cardiologist:  None   Patient Profile: .      PMH Colon cancer Mets to lung s/p resection in 2020 Aortic atherosclerosis CKD Stage 3a Dyslipidemia Coronary artery disease History of abnormal stress tests at age 78 Cardiac cath with no obstruction (was told he had one vessel smaller than normal) CT Calcium  score 03/13/24 CAC Score 581 (62nd percentile) LM 0, LAD 236, LCx 152, RCA 192       History of Present Illness: .   Discussed the use of AI scribe software for clinical note transcription with the patient, who gave verbal consent to proceed.  History of Present Illness Jack Gray is a very pleasant 78 year old male who present today for evaluation of elevated coronary artery calcium  score. CT calcium  score revealed CAC score of 581 (62nd percentile).  He previously saw cardiologist around the age of 42 because he told his PCP he wanted to start running. He underwent stress testing which was abnormal and led to cardiac catheterization.  There was no significant coronary disease but he was told he had a artery that was smaller than it should be. Since that time, he has not had symptoms that concerned him from a heart perspective.  He is active with walking, which he admits is at a leisurely pace because he is walking with a friend and talking. Does stretching and resistance training almost every morning.  He denies chest pain, shortness of breath, palpitations, orthopnea, PND, edema, presyncope, syncope. He feels well overall. He is on atorvastatin  for hyperlipidemia which reduced his cholesterol, however due to recent calcium  score, atorvastatin  was increased from 20 mg to 40 mg daily. He stopped taking daily baby aspirin due to increased bruising. Family history significant for heart disease in his father who had MI in his 96s. He later  had cerebral aneurysm; was a smoker. His mother had COPD related to smoking. He has five siblings, with one deceased at age 46 from lung cancer. His brother and sisters are alive, with one sister nearing 67 years old. His diet focuses on low-carb intake, and he has lost 25 pounds. He avoids soft drinks and has not required antihypertensive medication for over 20 years after reducing caffeine intake. He does not smoke or consume alcohol regularly.   Family history: His family history includes COPD in his mother; Cerebral aneurysm (age of onset: 61) in his father; Heart attack (age of onset: 84) in his father.  5 siblings, one sister has died from lung cancer One sister will be 77 soon  Discussed the use of AI scribe software for clinical note transcription with the patient, who gave verbal consent to proceed.  ASCVD Risk Score: ASCVD (Atherosclerotic Cardiovascular Disease) Risk Algorithm including Known ASCVD from AHA/ACC from [email or web address] on 04/15/2024 ** All calculations should be rechecked by clinician prior to use **  RESULT SUMMARY: 22.6 % Risk of cardiovascular event (coronary or stroke death or non-fatal MI or stroke) in next 10 years.  No statin recommended because age >55; always encourage healthy cardiovascular lifestyle choices. Some patients with other high risk features may still be appropriate for treatment. INPUTS: History of ASCVD --> 0 = No LDL Cholesterol >=190mg /dL (5.07 mmol/L) --> 0 = No Age --> 78 years Diabetes --> 0 = No Sex --> 1 = Male Total Cholesterol --> 145 mg/dL  HDL Cholesterol --> 46 mg/dL Systolic Blood Pressure --> 111 mm Hg Treatment for Hypertension --> 0 = No Smoker --> 0 = No Race --> 1 = White  Diet: Eats out frequently Limits carbs Bacon, sausage frequently  Activity: Golf Walks 1 to 1.5 miles several days per week leisurely but incline included  Stretching/resistance training every morning   No results found for: LIPOA    ROS: See  HPI       Studies Reviewed: SABRA   EKG Interpretation Date/Time:  Thursday April 17 2024 09:00:01 EDT Ventricular Rate:  73 PR Interval:  172 QRS Duration:  88 QT Interval:  378 QTC Calculation: 416 R Axis:   -32  Text Interpretation: Normal sinus rhythm Left axis deviation Minimal voltage criteria for LVH, may be normal variant ( R in aVL ) Nonspecific ST and T wave abnormality Confirmed by Percy Browning [phone]) on 04/17/2024 9:29:44 AM      Risk Assessment/Calculations:             Physical Exam:   VS: BP 124/78   Pulse 73   Ht 5' 8 (1.727 m)   Wt 164 lb 11.2 oz (74.7 kg)   SpO2 97%   BMI 25.04 kg/m   Wt Readings from Last 3 Encounters:  04/17/24 164 lb 11.2 oz (74.7 kg)  02/15/24 162 lb 12.8 oz (73.8 kg)  10/11/23 162 lb (73.5 kg)     GEN: Well nourished, well developed in no acute distress NECK: No JVD; No carotid bruits CARDIAC: RRR, no murmurs, rubs, gallops RESPIRATORY:  Clear to auscultation without rales, wheezing or rhonchi  ABDOMEN: Soft, non-tender, non-distended EXTREMITIES:  No edema; No deformity     ASSESSMENT AND PLAN: .    Assessment & Plan Coronary artery calcification Cardiac risk   CT calcium  score 03/13/2024 with CAC score of 581 (62nd percentile) with bulk of calcification in LAD, but distribution also and LCx and RCA.  EKG today reveals normal sinus rhythm at 73 bpm, nonspecific ST/T abnormality, no acute change from EKG in 2019.  History of cardiac catheterization many years ago that revealed  a vessel smaller than it should be but no significant obstruction per his report.  ASCVD risk score is 22.6%.  We discussed the individual components including age and history of hyperlipidemia.  He is not a smoker, no history of diabetes or hypertension. He denies chest pain, dyspnea, or other symptoms concerning for angina.  We had a lengthy discussion about further testing to evaluate for ischemia.  He does not feel the testing is indicated at this  time given that he is active and does not have any concerning symptoms.  No indication for further ischemic evaluation at this time.  I encouraged him to notify us  if he develops concerning symptoms and these were reviewed with him.  ER precautions given as well.  He does not want to resume aspirin due to significant bruising while taking it. - Encourage lifestyle modifications, including a heart-healthy diet and regular physical activity -Aim for at least 150 minutes of moderate intensity exercise each week - Consider ischemia evaluation if symptoms develop - Continue atorvastatin   Hyperlipidemia LDL goal < 70 Lipid panel completed 12/04/2023 with total cholesterol 145, HDL 46, LDL 80, and triglycerides 899. He has long-standing hyperlipidemia with a recent increase in LDL cholesterol.  We discussed importance of LDL goal 70 or lower to reduce cardiovascular risk.   -We will get fasting lipid panel to assess current cholesterol levels on  higher dose statin -Check LP (a) for further risk stratification -Encourage dietary modifications to reduce LDL cholesterol, including reducing intake of processed meats and increasing fruits and vegetables      Dispo: 6 months with me  Signed, Rosaline Bane, NP-C "

## 2024-04-17 ENCOUNTER — Encounter (HOSPITAL_BASED_OUTPATIENT_CLINIC_OR_DEPARTMENT_OTHER): Payer: Self-pay | Admitting: Nurse Practitioner

## 2024-04-17 ENCOUNTER — Ambulatory Visit (HOSPITAL_BASED_OUTPATIENT_CLINIC_OR_DEPARTMENT_OTHER): Admitting: Nurse Practitioner

## 2024-04-17 VITALS — BP 124/78 | HR 73 | Ht 68.0 in | Wt 164.7 lb

## 2024-04-17 DIAGNOSIS — R931 Abnormal findings on diagnostic imaging of heart and coronary circulation: Secondary | ICD-10-CM

## 2024-04-17 DIAGNOSIS — Z7189 Other specified counseling: Secondary | ICD-10-CM

## 2024-04-17 DIAGNOSIS — E785 Hyperlipidemia, unspecified: Secondary | ICD-10-CM

## 2024-04-17 DIAGNOSIS — I2510 Atherosclerotic heart disease of native coronary artery without angina pectoris: Secondary | ICD-10-CM

## 2024-04-17 NOTE — Patient Instructions (Addendum)
 Medication Instructions:   Your physician recommends that you continue on your current medications as directed. Please refer to the Current Medication list given to you today.   *If you need a refill on your cardiac medications before your next appointment, please call your pharmacy*  Lab Work:  Your physician recommends that you return for a FASTING lipid profile/LPa, fasting after midnight. Paperwork given to patient today.     If you have labs (blood work) drawn today and your tests are completely normal, you will receive your results only by: MyChart Message (if you have MyChart) OR A paper copy in the mail If you have any lab test that is abnormal or we need to change your treatment, we will call you to review the results.  Testing/Procedures:  None ordered.  Follow-Up: At [facility], you and your health needs are our priority.  As part of our continuing mission to provide you with exceptional heart care, our providers are all part of one team.  This team includes your primary Cardiologist (physician) and Advanced Practice Providers or APPs (Physician Assistants and Nurse Practitioners) who all work together to provide you with the care you need, when you need it.  Your next appointment:   6 month(s)  Provider:   Rosaline Bane, NP    We recommend signing up for the patient portal called MyChart.  Sign up information is provided on this After Visit Summary.  MyChart is used to connect with patients for Virtual Visits (Telemedicine).  Patients are able to view lab/test results, encounter notes, upcoming appointments, etc.  Non-urgent messages can be sent to your provider as well.   To learn more about what you can do with MyChart, go to [email or web address].   Other Instructions  Your physician wants you to follow-up in: 6 months.  You will receive a reminder letter in the mail two months in advance. If you don't receive a letter, please call our office to  schedule the follow-up appointment.  Adopting a Healthy Lifestyle.   Weight: Know what a healthy weight is for you (roughly BMI <25) and aim to maintain this. You can calculate your body mass index on your smart phone. Unfortunately, this is not the most accurate measure of healthy weight, but it is the simplest measurement to use. A more accurate measurement involves body scanning which measures lean muscle, fat tissue and bony density. We do not have this equipment at [facility].    Diet: Aim for 7+ servings of fruits and vegetables daily Limit animal fats in diet for cholesterol and heart health - choose grass fed whenever available Avoid highly processed foods (fast food burgers, tacos, fried chicken, pizza, hot dogs, french fries)  Saturated fat comes in the form of butter, lard, coconut oil, margarine, partially hydrogenated oils, and fat in meat. These increase your risk of cardiovascular disease.  Use healthy plant oils, such as olive, canola, soy, corn, sunflower and peanut.  Whole foods such as fruits, vegetables and whole grains have fiber  Men need > 38 grams of fiber per day Women need > 25 grams of fiber per day  Load up on vegetables and fruits - one-half of your plate: Aim for color and variety, and remember that potatoes dont count. Go for whole grains - one-quarter of your plate: Whole wheat, barley, wheat berries, quinoa, oats, brown rice, and foods made with them. If you want pasta, go with whole wheat pasta. Protein power - one-quarter of your plate: Fish, chicken, beans,  and nuts are all healthy, versatile protein sources. Limit red meat. You need carbohydrates for energy! The type of carbohydrate is more important than the amount. Choose carbohydrates such as vegetables, fruits, whole grains, beans, and nuts in the place of white rice, white pasta, potatoes (baked or fried), macaroni and cheese, cakes, cookies, and donuts.  If youre thirsty, drink water. Coffee and tea are  good in moderation, but skip sugary drinks and limit milk and dairy products to one or two daily servings. Keep sugar intake at 6 teaspoons or 24 grams or LESS       Exercise: Aim for 150 min of moderate intensity exercise weekly for heart health, and weights twice weekly for bone health Stay active - any steps are better than no steps! Aim for 7-9 hours of sleep daily

## 2024-04-21 DIAGNOSIS — I2510 Atherosclerotic heart disease of native coronary artery without angina pectoris: Secondary | ICD-10-CM | POA: Diagnosis not present

## 2024-04-22 LAB — LIPID PANEL
Chol/HDL Ratio: 2.4 ratio (ref 0.0–5.0)
Cholesterol, Total: 110 mg/dL (ref 100–199)
HDL: 46 mg/dL (ref >39)
LDL Chol Calc (NIH): 48 mg/dL (ref 0–99)
Triglycerides: 81 mg/dL (ref 0–149)
VLDL Cholesterol Cal: 16 mg/dL (ref 5–40)

## 2024-04-22 LAB — LIPOPROTEIN A (LPA): Lipoprotein (a): <8.4 nmol/L (ref <75.0)

## 2024-04-23 ENCOUNTER — Ambulatory Visit: Payer: Self-pay | Admitting: Nurse Practitioner

## 2024-06-09 ENCOUNTER — Encounter: Payer: Self-pay | Admitting: Radiology

## 2024-06-10 DIAGNOSIS — N1831 Chronic kidney disease, stage 3a: Secondary | ICD-10-CM | POA: Diagnosis not present

## 2024-06-10 DIAGNOSIS — Z85038 Personal history of other malignant neoplasm of large intestine: Secondary | ICD-10-CM | POA: Diagnosis not present

## 2024-06-10 DIAGNOSIS — E782 Mixed hyperlipidemia: Secondary | ICD-10-CM | POA: Diagnosis not present

## 2024-06-19 DIAGNOSIS — Z85038 Personal history of other malignant neoplasm of large intestine: Secondary | ICD-10-CM | POA: Diagnosis not present

## 2024-06-19 DIAGNOSIS — I2510 Atherosclerotic heart disease of native coronary artery without angina pectoris: Secondary | ICD-10-CM | POA: Diagnosis not present

## 2024-06-19 DIAGNOSIS — M25511 Pain in right shoulder: Secondary | ICD-10-CM | POA: Diagnosis not present

## 2024-06-19 DIAGNOSIS — Z23 Encounter for immunization: Secondary | ICD-10-CM | POA: Diagnosis not present

## 2024-06-19 DIAGNOSIS — N1831 Chronic kidney disease, stage 3a: Secondary | ICD-10-CM | POA: Diagnosis not present

## 2024-06-19 DIAGNOSIS — N200 Calculus of kidney: Secondary | ICD-10-CM | POA: Diagnosis not present

## 2024-06-19 DIAGNOSIS — Z6826 Body mass index (BMI) 26.0-26.9, adult: Secondary | ICD-10-CM | POA: Diagnosis not present

## 2024-06-19 DIAGNOSIS — Z8589 Personal history of malignant neoplasm of other organs and systems: Secondary | ICD-10-CM | POA: Diagnosis not present

## 2024-06-19 DIAGNOSIS — R7989 Other specified abnormal findings of blood chemistry: Secondary | ICD-10-CM | POA: Diagnosis not present

## 2024-06-19 DIAGNOSIS — E782 Mixed hyperlipidemia: Secondary | ICD-10-CM | POA: Diagnosis not present

## 2024-06-24 DIAGNOSIS — R351 Nocturia: Secondary | ICD-10-CM | POA: Diagnosis not present

## 2024-06-24 DIAGNOSIS — N200 Calculus of kidney: Secondary | ICD-10-CM | POA: Diagnosis not present

## 2024-06-24 DIAGNOSIS — N401 Enlarged prostate with lower urinary tract symptoms: Secondary | ICD-10-CM | POA: Diagnosis not present

## 2024-06-25 DIAGNOSIS — H353131 Nonexudative age-related macular degeneration, bilateral, early dry stage: Secondary | ICD-10-CM | POA: Diagnosis not present

## 2024-07-01 DIAGNOSIS — Z85038 Personal history of other malignant neoplasm of large intestine: Secondary | ICD-10-CM | POA: Diagnosis not present

## 2024-07-01 DIAGNOSIS — Z980 Intestinal bypass and anastomosis status: Secondary | ICD-10-CM | POA: Diagnosis not present

## 2024-07-01 DIAGNOSIS — K5730 Diverticulosis of large intestine without perforation or abscess without bleeding: Secondary | ICD-10-CM | POA: Diagnosis not present

## 2024-07-01 DIAGNOSIS — D124 Benign neoplasm of descending colon: Secondary | ICD-10-CM | POA: Diagnosis not present

## 2024-07-01 DIAGNOSIS — D123 Benign neoplasm of transverse colon: Secondary | ICD-10-CM | POA: Diagnosis not present

## 2024-07-01 DIAGNOSIS — Z08 Encounter for follow-up examination after completed treatment for malignant neoplasm: Secondary | ICD-10-CM | POA: Diagnosis not present

## 2024-07-01 DIAGNOSIS — K648 Other hemorrhoids: Secondary | ICD-10-CM | POA: Diagnosis not present

## 2024-07-07 DIAGNOSIS — D123 Benign neoplasm of transverse colon: Secondary | ICD-10-CM | POA: Diagnosis not present

## 2024-07-07 DIAGNOSIS — D124 Benign neoplasm of descending colon: Secondary | ICD-10-CM | POA: Diagnosis not present
